# Patient Record
Sex: Male | Born: 1980
Health system: Southern US, Community
[De-identification: ages and names within clinical notes are randomized; demographics above are authoritative.]

## PROBLEM LIST (undated history)

## (undated) ENCOUNTER — Emergency Department (HOSPITAL_BASED_OUTPATIENT_CLINIC_OR_DEPARTMENT_OTHER): Discharge status: Absent without leave | Payer: Managed Care, Other (non HMO)

## (undated) DIAGNOSIS — F419 Anxiety disorder, unspecified: Secondary | ICD-10-CM

## (undated) DIAGNOSIS — N289 Disorder of kidney and ureter, unspecified: Secondary | ICD-10-CM

## (undated) DIAGNOSIS — F329 Major depressive disorder, single episode, unspecified: Secondary | ICD-10-CM

## (undated) DIAGNOSIS — G8929 Other chronic pain: Secondary | ICD-10-CM

## (undated) DIAGNOSIS — M5126 Other intervertebral disc displacement, lumbar region: Secondary | ICD-10-CM

## (undated) DIAGNOSIS — M545 Low back pain, unspecified: Secondary | ICD-10-CM

## (undated) DIAGNOSIS — Z765 Malingerer [conscious simulation]: Secondary | ICD-10-CM

## (undated) DIAGNOSIS — M1990 Unspecified osteoarthritis, unspecified site: Secondary | ICD-10-CM

## (undated) DIAGNOSIS — G4730 Sleep apnea, unspecified: Secondary | ICD-10-CM

## (undated) DIAGNOSIS — M51369 Other intervertebral disc degeneration, lumbar region without mention of lumbar back pain or lower extremity pain: Secondary | ICD-10-CM

## (undated) DIAGNOSIS — N200 Calculus of kidney: Secondary | ICD-10-CM

## (undated) DIAGNOSIS — F32A Depression, unspecified: Secondary | ICD-10-CM

## (undated) DIAGNOSIS — M5136 Other intervertebral disc degeneration, lumbar region: Secondary | ICD-10-CM

## (undated) DIAGNOSIS — F514 Sleep terrors [night terrors]: Secondary | ICD-10-CM

## (undated) DIAGNOSIS — Z8489 Family history of other specified conditions: Secondary | ICD-10-CM

## (undated) DIAGNOSIS — Z87442 Personal history of urinary calculi: Secondary | ICD-10-CM

## (undated) HISTORY — PX: BLADDER STONE REMOVAL: SHX568

## (undated) HISTORY — PX: LITHOTRIPSY: SUR834

---

## 2003-04-20 ENCOUNTER — Emergency Department (HOSPITAL_COMMUNITY): Admission: EM | Admit: 2003-04-20 | Discharge: 2003-04-20 | Payer: Self-pay | Admitting: Emergency Medicine

## 2010-03-23 ENCOUNTER — Emergency Department (HOSPITAL_BASED_OUTPATIENT_CLINIC_OR_DEPARTMENT_OTHER)
Admission: EM | Admit: 2010-03-23 | Discharge: 2010-03-23 | Payer: Self-pay | Source: Home / Self Care | Admitting: Emergency Medicine

## 2010-04-01 ENCOUNTER — Encounter: Payer: Self-pay | Admitting: Internal Medicine

## 2010-04-01 ENCOUNTER — Ambulatory Visit: Payer: Self-pay | Admitting: Internal Medicine

## 2010-04-01 DIAGNOSIS — R1032 Left lower quadrant pain: Secondary | ICD-10-CM | POA: Insufficient documentation

## 2010-04-01 LAB — CONVERTED CEMR LAB
Basophils Absolute: 0 10*3/uL (ref 0.0–0.1)
Basophils Relative: 0 % (ref 0–1)
Eosinophils Absolute: 0.1 10*3/uL (ref 0.0–0.7)
Eosinophils Relative: 1 % (ref 0–5)
HCT: 44.5 % (ref 39.0–52.0)
Hemoglobin: 16 g/dL (ref 13.0–17.0)
Lymphocytes Relative: 26 % (ref 12–46)
Lymphs Abs: 2.7 10*3/uL (ref 0.7–4.0)
MCHC: 36 g/dL (ref 30.0–36.0)
MCV: 87.3 fL (ref 78.0–100.0)
Monocytes Absolute: 0.9 10*3/uL (ref 0.1–1.0)
Monocytes Relative: 9 % (ref 3–12)
Neutro Abs: 6.4 10*3/uL (ref 1.7–7.7)
Neutrophils Relative %: 64 % (ref 43–77)
Platelets: 288 10*3/uL (ref 150–400)
RBC: 5.1 M/uL (ref 4.22–5.81)
RDW: 12.7 % (ref 11.5–15.5)
Sed Rate: 1 mm/hr (ref 0–16)
WBC: 10.1 10*3/uL (ref 4.0–10.5)

## 2010-04-02 ENCOUNTER — Telehealth: Payer: Self-pay | Admitting: Internal Medicine

## 2010-04-03 ENCOUNTER — Encounter (INDEPENDENT_AMBULATORY_CARE_PROVIDER_SITE_OTHER): Payer: Self-pay | Admitting: *Deleted

## 2010-04-15 ENCOUNTER — Ambulatory Visit
Admission: RE | Admit: 2010-04-15 | Discharge: 2010-04-15 | Payer: Self-pay | Source: Home / Self Care | Attending: Internal Medicine | Admitting: Internal Medicine

## 2010-04-15 DIAGNOSIS — Z87442 Personal history of urinary calculi: Secondary | ICD-10-CM | POA: Insufficient documentation

## 2010-04-15 LAB — CONVERTED CEMR LAB: Tissue Transglutaminase Ab, IgA: 1.9 units (ref <20)

## 2010-04-21 ENCOUNTER — Encounter: Payer: Self-pay | Admitting: Internal Medicine

## 2010-05-15 NOTE — Assessment & Plan Note (Signed)
Summary: new to est uhc/mhf   Vital Signs:  Patient profile:   30 year old male Height:      68 inches Weight:      184.25 pounds BMI:     28.12 O2 Sat:      99 % on Room air Temp:     97.0 degrees F oral Pulse rate:   88 / minute Resp:     19 per minute BP sitting:   110 / 80  (right arm) Cuff size:   large  Vitals Entered By: Glendell Docker CMA (April 01, 2010 1:34 PM)  O2 Flow:  Room air CC: New patient    Primary Care Provider:  Dondra Spry DO  CC:  New patient .  History of Present Illness: 30 y/o white male to establish seen in ER 12/11 for significant abd pain  -  LLQ.    came back from party - family function bbq, slaw, sweet tea came home - ate pinto beans started having loose stools, stomach "felt tore up" previously suffered intermittent abd pains (ongoing last 4-5 yrs). prev abd pain always got better after BMs.  no bloody diarrhea.  no fever or chills weight is stable no family hx of IBD no prev abd surgery freq eats fast food usual BMs every other day,  consistency is variable occ stools are hard to pass   ER records reviewed: CT of abd and pelvis 6 mm diameter nonobstructing calculus of lower pole of left kidney additional tiny nonobstructing kidney stone left lower kidney no analysis of kidney tiny renal cysts disparate small bowel diameter  Preventive Screening-Counseling & Management  Alcohol-Tobacco     Alcohol drinks/day: <1     Smoking Status: current     Packs/Day: 0.5     Year Started: 1999  Caffeine-Diet-Exercise     Caffeine use/day: 3-5 beverages daily     Does Patient Exercise: no  Allergies (verified): No Known Drug Allergies  Past History:  Past Surgical History: Denies surgical history  Contraindications/Deferment of Procedures/Staging:    Test/Procedure: FLU VAX    Reason for deferment: patient declined   Family History: Family History of Stroke F 1st degree relative maternal gm Family History of Stroke M  1st degree relative paternal gf  Social History: 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    Physical Exam  General:  alert, well-developed, and well-nourished.   Head:  normocephalic and atraumatic.   Eyes:  pupils equal, pupils round, and pupils reactive to light.   Mouth:  good dentition and pharynx pink and moist.   Neck:  No deformities, masses, or tenderness noted. Lungs:  normal respiratory effort and normal breath sounds.   Heart:  normal rate, regular rhythm, and no gallop.   Abdomen:  mild LLQ tenderness,  soft, normal bowel sounds, no guarding, no rigidity, no hepatomegaly, and no splenomegaly.   Extremities:  No lower extremity edema Neurologic:  cranial nerves II-XII intact and gait normal.   Skin:  keratotic skin lesion left upper forearm Psych:  normally interactive, good eye  contact, not anxious appearing, and not depressed appearing.     Impression & Recommendations:  Problem # 1:  ABDOMINAL PAIN, LEFT LOWER QUADRANT (ICD-789.04) unexplained LLQ pain.  possbile IBS .  use antispasmodic  Orders: T-CBC w/Diff ([address]-60454) T-Sed Rate (Automated) (09811-91478) Gastroenterology Referral (GI)  Complete Medication List: 1)  Oxycodone-acetaminophen 5-325 Mg Tabs (Oxycodone-acetaminophen) .Marland Kitchen.. 1-2 tablets by mouth every 6 hours as needed pai n 2)  Colace 100 Mg Caps (Docusate sodium) .... Take 1 capsule by mouth two times a day as needed 3)  Promethazine Hcl 25 Mg Tabs (Promethazine hcl) .... One tablet by mouth  every 8 hours as needed nausea 4)  Zofran Odt 8 Mg Tbdp (Ondansetron) .... One tablet by mouth every 4 hours as needed nausea & vomiting 5)  Dicyclomine Hcl 10 Mg Caps (Dicyclomine hcl) .... One by mouth three times a day as needed  Patient Instructions: 1)  Use miralax over the counter two times a day 2)  Please schedule a follow-up appointment in 2 weeks. 3)  Call our office if your symptoms do not  improve or gets worse. Prescriptions: DICYCLOMINE HCL 10 MG CAPS (DICYCLOMINE HCL) one by mouth three times a day as needed  #30 x 0   Entered and Authorized by:   D. Thomos Lemons DO   Signed by:   D. Thomos Lemons DO on 04/01/2010   Method used:   Print then Give to Patient   RxID:   [id number]    Orders Added: 1)  T-CBC w/Diff [[id number]] 2)  T-Sed Rate (Automated) [44010-27253] 3)  Gastroenterology Referral [GI] 4)  New Patient Level III [99203]   Immunization History:  Influenza Immunization History:    Influenza:  declined (04/01/2010)   Immunization History:  Influenza Immunization History:    Influenza:  Declined (04/01/2010)  Current Allergies (reviewed today): No known allergies

## 2010-05-15 NOTE — Assessment & Plan Note (Signed)
Summary: 2 week follow up/mhf   Vital Signs:  Patient profile:   30 year old male Height:      68 inches Weight:      187.50 pounds BMI:     28.61 O2 Sat:      99 % on Room air Temp:     98.2 degrees F oral Pulse rate:   93 / minute Resp:     18 per minute BP sitting:   100 / 78  (left arm) Cuff size:   large  Vitals Entered By: Glendell Docker CMA (April 15, 2010 3:51 PM)  O2 Flow:  Room air CC: 2 week follow up  Is Patient Diabetic? No Pain Assessment Patient in pain? no      Comments symptoms have resolved, feels better, no concerns   Primary Care Provider:  DThomos Lemons DO  CC:  2 week follow up .  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  Physical Exam  General:  alert, well-developed, and well-nourished.   Lungs:  normal respiratory effort and normal breath sounds.   Heart:  normal rate, regular rhythm, and no gallop.   Abdomen:  soft, non-tender, normal bowel sounds, and no masses.     Impression & Recommendations:  Problem # 1:  ABDOMINAL PAIN, LEFT LOWER QUADRANT (ICD-789.04) Assessment Improved improved with regular fiber intake and bentyl cancel GI referral. probable IBS  rule out celiac sprue  Orders: T- * Misc. Laboratory test [phone])  Problem # 2:  NEPHROLITHIASIS, HX OF (ICD-V13.01) CT of abd and pelvis  03/2010 6 mm diameter nonobstructing calculus of lower pole of  left kidney additional tiny nonobstructing kidney stone left lower kidney no analysis of kidney tiny renal cysts disparate small bowel diameter  pt advised to increase fluid intake.  avoid dehydration  Patient Instructions: 1)  Please schedule a follow-up appointment in 1 year.   Orders Added: 1)  T- * Misc. Laboratory test [99999] 2)  Est. Patient Level III [60454]    Current Allergies (reviewed today): No known allergies

## 2010-05-15 NOTE — Progress Notes (Signed)
Summary: Lab Results  Phone Note Outgoing Call   Summary of Call: call pt - cbc and sed rate normal Initial call taken by: D. Thomos Lemons DO,  April 02, 2010 12:37 PM  Follow-up for Phone Call        call placed to patient at [phone], no answer, A detailed voice message was left informing patient per Dr Artist Pais instructions Follow-up by: Glendell Docker CMA,  April 02, 2010 2:07 PM

## 2010-05-15 NOTE — Letter (Signed)
Summary: Primary Care Consult Scheduled Letter  Gregory Flores at [facility]  [address]   [place], [place] [address]   Phone: [phone]  Fax: [phone]      04/03/2010 MRN: [id number]  [facility] [address] [place], [place]  [address]    Dear Gregory Flores,      We have scheduled an appointment for you.  At the recommendation of Dr.YOO, we have scheduled you a consult with [facility],   DR Vernell Barrier on JANUARY 5,2012 at 10:45AM.  Their address is_[address], [place] . The office phone number is [phone].  If this appointment day and time is not convenient for you, please feel free to call the office of the doctor you are being referred to at the number listed above and reschedule the appointment.     It is important for you to keep your scheduled appointments. We are here to make sure you are given good patient care.     Thank you,  Darral Dash Patient Care Coordinator [facility]

## 2010-05-15 NOTE — Letter (Signed)
   [place] at [facility] [address] [place], [place]  [address]  [place] Phone: [phone]      April 21, 2010   [facility] [address] [place], [place] [address]  RE:  LAB RESULTS  Dear  Mr. Szafran,  The following is an interpretation of your most recent lab tests.  Please take note of any instructions provided or changes to medications that have resulted from your lab work.   Celiac sprue antibody test - negative        Sincerely Yours,    Dr. Thomos Lemons  Appended Document:  mailed

## 2010-06-24 LAB — URINALYSIS, ROUTINE W REFLEX MICROSCOPIC
Bilirubin Urine: NEGATIVE
Glucose, UA: NEGATIVE mg/dL
Ketones, ur: NEGATIVE mg/dL
Leukocytes, UA: NEGATIVE
Nitrite: NEGATIVE
Protein, ur: NEGATIVE mg/dL
Specific Gravity, Urine: 1.023 (ref 1.005–1.030)
Urobilinogen, UA: 1 mg/dL (ref 0.0–1.0)
pH: 6 (ref 5.0–8.0)

## 2010-06-24 LAB — COMPREHENSIVE METABOLIC PANEL
ALT: 29 U/L (ref 0–53)
AST: 22 U/L (ref 0–37)
Albumin: 4.4 g/dL (ref 3.5–5.2)
Alkaline Phosphatase: 76 U/L (ref 39–117)
BUN: 18 mg/dL (ref 6–23)
CO2: 24 mEq/L (ref 19–32)
Calcium: 9.6 mg/dL (ref 8.4–10.5)
Chloride: 107 mEq/L (ref 96–112)
Creatinine, Ser: 1.2 mg/dL (ref 0.4–1.5)
GFR calc Af Amer: >60 mL/min (ref >60)
GFR calc non Af Amer: >60 mL/min (ref >60)
Glucose, Bld: 110 mg/dL — ABNORMAL HIGH (ref 70–99)
Potassium: 4.4 mEq/L (ref 3.5–5.1)
Sodium: 143 mEq/L (ref 135–145)
Total Bilirubin: 0.6 mg/dL (ref 0.3–1.2)
Total Protein: 7.2 g/dL (ref 6.0–8.3)

## 2010-06-24 LAB — CBC
HCT: 42.3 % (ref 39.0–52.0)
Hemoglobin: 15.3 g/dL (ref 13.0–17.0)
MCH: 31.1 pg (ref 26.0–34.0)
MCHC: 36.2 g/dL — ABNORMAL HIGH (ref 30.0–36.0)
MCV: 86 fL (ref 78.0–100.0)
Platelets: 273 10*3/uL (ref 150–400)
RBC: 4.92 MIL/uL (ref 4.22–5.81)
RDW: 12.6 % (ref 11.5–15.5)
WBC: 12.7 10*3/uL — ABNORMAL HIGH (ref 4.0–10.5)

## 2010-06-24 LAB — URINE MICROSCOPIC-ADD ON

## 2010-06-24 LAB — DIFFERENTIAL
Basophils Absolute: 0 10*3/uL (ref 0.0–0.1)
Basophils Relative: 0 % (ref 0–1)
Eosinophils Absolute: 0.1 10*3/uL (ref 0.0–0.7)
Eosinophils Relative: 1 % (ref 0–5)
Lymphocytes Relative: 13 % (ref 12–46)
Lymphs Abs: 1.7 10*3/uL (ref 0.7–4.0)
Monocytes Absolute: 1.1 10*3/uL — ABNORMAL HIGH (ref 0.1–1.0)
Monocytes Relative: 9 % (ref 3–12)
Neutro Abs: 9.9 10*3/uL — ABNORMAL HIGH (ref 1.7–7.7)
Neutrophils Relative %: 78 % — ABNORMAL HIGH (ref 43–77)

## 2010-09-01 ENCOUNTER — Emergency Department (INDEPENDENT_AMBULATORY_CARE_PROVIDER_SITE_OTHER): Payer: 59

## 2010-09-01 ENCOUNTER — Emergency Department (HOSPITAL_BASED_OUTPATIENT_CLINIC_OR_DEPARTMENT_OTHER)
Admission: EM | Admit: 2010-09-01 | Discharge: 2010-09-01 | Disposition: A | Discharge status: Home / Self Care | Payer: 59 | Attending: Emergency Medicine | Admitting: Emergency Medicine

## 2010-09-01 DIAGNOSIS — N201 Calculus of ureter: Secondary | ICD-10-CM | POA: Insufficient documentation

## 2010-09-01 DIAGNOSIS — N200 Calculus of kidney: Secondary | ICD-10-CM

## 2010-09-01 DIAGNOSIS — R109 Unspecified abdominal pain: Secondary | ICD-10-CM | POA: Insufficient documentation

## 2010-09-01 LAB — URINALYSIS, ROUTINE W REFLEX MICROSCOPIC
Bilirubin Urine: NEGATIVE
Glucose, UA: NEGATIVE mg/dL
Ketones, ur: 15 mg/dL — AB
Nitrite: NEGATIVE
Protein, ur: 30 mg/dL — AB
Specific Gravity, Urine: 1.016 (ref 1.005–1.030)
Urobilinogen, UA: 0.2 mg/dL (ref 0.0–1.0)
pH: 6 (ref 5.0–8.0)

## 2010-09-01 LAB — DIFFERENTIAL
Basophils Absolute: 0 10*3/uL (ref 0.0–0.1)
Basophils Relative: 0 % (ref 0–1)
Eosinophils Absolute: 0.1 10*3/uL (ref 0.0–0.7)
Eosinophils Relative: 1 % (ref 0–5)
Monocytes Absolute: 0.6 10*3/uL (ref 0.1–1.0)
Neutro Abs: 4.8 10*3/uL (ref 1.7–7.7)

## 2010-09-01 LAB — CBC
HCT: 43.4 % (ref 39.0–52.0)
Hemoglobin: 15.6 g/dL (ref 13.0–17.0)
MCH: 30.4 pg (ref 26.0–34.0)
MCHC: 35.9 g/dL (ref 30.0–36.0)
RDW: 12.4 % (ref 11.5–15.5)

## 2010-09-01 LAB — BASIC METABOLIC PANEL
CO2: 24 mEq/L (ref 19–32)
Calcium: 9.5 mg/dL (ref 8.4–10.5)
Chloride: 102 mEq/L (ref 96–112)
GFR calc Af Amer: >60 mL/min (ref >60)
GFR calc non Af Amer: >60 mL/min (ref >60)
Glucose, Bld: 100 mg/dL — ABNORMAL HIGH (ref 70–99)
Sodium: 139 mEq/L (ref 135–145)

## 2010-09-01 LAB — URINE MICROSCOPIC-ADD ON

## 2011-03-19 ENCOUNTER — Encounter: Payer: Self-pay | Admitting: Internal Medicine

## 2011-04-15 ENCOUNTER — Encounter: Payer: Self-pay | Admitting: Internal Medicine

## 2011-08-28 ENCOUNTER — Emergency Department (HOSPITAL_BASED_OUTPATIENT_CLINIC_OR_DEPARTMENT_OTHER)
Admission: EM | Admit: 2011-08-28 | Discharge: 2011-08-28 | Disposition: A | Discharge status: Home / Self Care | Payer: BC Managed Care – PPO | Attending: Emergency Medicine | Admitting: Emergency Medicine

## 2011-08-28 ENCOUNTER — Encounter (HOSPITAL_BASED_OUTPATIENT_CLINIC_OR_DEPARTMENT_OTHER): Payer: Self-pay | Admitting: *Deleted

## 2011-08-28 DIAGNOSIS — F17200 Nicotine dependence, unspecified, uncomplicated: Secondary | ICD-10-CM | POA: Insufficient documentation

## 2011-08-28 DIAGNOSIS — R110 Nausea: Secondary | ICD-10-CM | POA: Insufficient documentation

## 2011-08-28 DIAGNOSIS — K5900 Constipation, unspecified: Secondary | ICD-10-CM | POA: Insufficient documentation

## 2011-08-28 HISTORY — DX: Disorder of kidney and ureter, unspecified: N28.9

## 2011-08-28 MED ORDER — BISACODYL 5 MG PO TBEC: 5.0000 mg | DELAYED_RELEASE_TABLET | Freq: Two times a day (BID) | ORAL | Status: AC

## 2011-08-28 MED ORDER — FLEET ENEMA 7-19 GM/118ML RE ENEM
ENEMA | RECTAL | Status: AC
  Filled 2011-08-28: qty 1

## 2011-08-28 MED ORDER — FLEET ENEMA 7-19 GM/118ML RE ENEM
1.0000 | ENEMA | Freq: Once | RECTAL | Status: AC
  Administered 2011-08-28: 1 via RECTAL

## 2011-08-28 NOTE — ED Notes (Signed)
Pt had recent lithotripsy performed over a week ago, and is now on pain meds. States d/t the narcotics, he has been unable to have a bowel movement since Monday of this week. Pt gave himself an enema approx 1 hour ago without relief.

## 2011-08-28 NOTE — Discharge Instructions (Signed)

## 2011-08-28 NOTE — ED Provider Notes (Signed)
History     CSN: [id number]  Arrival date & time 08/28/11  2053   First MD Initiated Contact with Patient 08/28/11 2143      Chief Complaint  Patient presents with  . Constipation    (Consider location/radiation/quality/duration/timing/severity/associated sxs/prior treatment) HPI Patient with complaints of constipation since Monday. He has been oxycodone for pain since lithotripsy.  He feels rectal fullness and has had some nausea but no vomiting or abdominal pain. Patient given fleets enema prior to my evaluation and now has resolution of his symptoms.  Past Surgical History  Procedure Date  . Lithotripsy     No family history on file.  History  Substance Use Topics  . Smoking status: Current Everyday Smoker  . Smokeless tobacco: Not on file  . Alcohol Use: Yes     rarely      Review of Systems  Gastrointestinal: Negative.   Genitourinary: Negative.     Allergies  Review of patient's allergies indicates no known allergies.  Home Medications   Current Outpatient Rx  Name Route Sig Dispense Refill  . IBUPROFEN 200 MG PO TABS Oral Take 200 mg by mouth every 6 (six) hours as needed. For pain    . OXYCODONE HCL 30 MG PO TABS Oral Take 30 mg by mouth 2 (two) times daily.      BP 131/81  Pulse 88  Temp(Src) 98.7 F (37.1 C) (Oral)  Resp 18  Ht 5\' 7"  (1.702 m)  Wt 190 lb (86.183 kg)  BMI 29.76 kg/m2  SpO2 98%  Physical Exam  Nursing note and vitals reviewed. Constitutional: He is oriented to person, place, and time. He appears well-developed and well-nourished.  HENT:  Head: Normocephalic and atraumatic.  Cardiovascular: Normal rate and regular rhythm.   Pulmonary/Chest: Effort normal and breath sounds normal.  Abdominal: Soft. Bowel sounds are normal.  Neurological: He is alert and oriented to person, place, and time.  Skin: Skin is warm and dry.  Psychiatric: He has a normal mood and affect.    ED Course  Procedures (including critical care  time)  Labs Reviewed - No data to display No results found.   No diagnosis found.    MDM         Hilario Quarry, MD 08/28/11 2322

## 2012-11-16 ENCOUNTER — Emergency Department (HOSPITAL_BASED_OUTPATIENT_CLINIC_OR_DEPARTMENT_OTHER): Payer: BC Managed Care – PPO

## 2012-11-16 ENCOUNTER — Encounter (HOSPITAL_BASED_OUTPATIENT_CLINIC_OR_DEPARTMENT_OTHER): Payer: Self-pay | Admitting: *Deleted

## 2012-11-16 ENCOUNTER — Emergency Department (HOSPITAL_BASED_OUTPATIENT_CLINIC_OR_DEPARTMENT_OTHER)
Admission: EM | Admit: 2012-11-16 | Discharge: 2012-11-16 | Disposition: A | Discharge status: Home / Self Care | Payer: BC Managed Care – PPO | Attending: Emergency Medicine | Admitting: Emergency Medicine

## 2012-11-16 DIAGNOSIS — N200 Calculus of kidney: Secondary | ICD-10-CM | POA: Insufficient documentation

## 2012-11-16 DIAGNOSIS — M549 Dorsalgia, unspecified: Secondary | ICD-10-CM

## 2012-11-16 DIAGNOSIS — F17200 Nicotine dependence, unspecified, uncomplicated: Secondary | ICD-10-CM | POA: Insufficient documentation

## 2012-11-16 DIAGNOSIS — R197 Diarrhea, unspecified: Secondary | ICD-10-CM | POA: Insufficient documentation

## 2012-11-16 DIAGNOSIS — R109 Unspecified abdominal pain: Secondary | ICD-10-CM | POA: Insufficient documentation

## 2012-11-16 DIAGNOSIS — Z87448 Personal history of other diseases of urinary system: Secondary | ICD-10-CM | POA: Insufficient documentation

## 2012-11-16 DIAGNOSIS — R630 Anorexia: Secondary | ICD-10-CM | POA: Insufficient documentation

## 2012-11-16 DIAGNOSIS — Z79899 Other long term (current) drug therapy: Secondary | ICD-10-CM | POA: Insufficient documentation

## 2012-11-16 DIAGNOSIS — Z9889 Other specified postprocedural states: Secondary | ICD-10-CM | POA: Insufficient documentation

## 2012-11-16 DIAGNOSIS — R112 Nausea with vomiting, unspecified: Secondary | ICD-10-CM | POA: Insufficient documentation

## 2012-11-16 LAB — BASIC METABOLIC PANEL
BUN: 11 mg/dL (ref 6–23)
Creatinine, Ser: 0.9 mg/dL (ref 0.50–1.35)
GFR calc non Af Amer: >90 mL/min (ref >90)
Glucose, Bld: 90 mg/dL (ref 70–99)
Potassium: 3.7 mEq/L (ref 3.5–5.1)

## 2012-11-16 LAB — URINALYSIS, ROUTINE W REFLEX MICROSCOPIC
Bilirubin Urine: NEGATIVE
Glucose, UA: NEGATIVE mg/dL
Hgb urine dipstick: NEGATIVE
Ketones, ur: NEGATIVE mg/dL
Leukocytes, UA: NEGATIVE
Protein, ur: NEGATIVE mg/dL
Specific Gravity, Urine: 1.019 (ref 1.005–1.030)
pH: 7.5 (ref 5.0–8.0)

## 2012-11-16 LAB — CBC
MCHC: 35.4 g/dL (ref 30.0–36.0)
RBC: 4.87 MIL/uL (ref 4.22–5.81)
RDW: 12.3 % (ref 11.5–15.5)
WBC: 11.6 10*3/uL — ABNORMAL HIGH (ref 4.0–10.5)

## 2012-11-16 LAB — URINE MICROSCOPIC-ADD ON

## 2012-11-16 MED ORDER — HYDROMORPHONE HCL PF 1 MG/ML IJ SOLN
1.0000 mg | Freq: Once | INTRAMUSCULAR | Status: AC
  Administered 2012-11-16: 1 mg via INTRAVENOUS
  Filled 2012-11-16: qty 1

## 2012-11-16 MED ORDER — ONDANSETRON HCL 4 MG/2ML IJ SOLN
4.0000 mg | Freq: Once | INTRAMUSCULAR | Status: DC
  Filled 2012-11-16: qty 2

## 2012-11-16 MED ORDER — TAMSULOSIN HCL 0.4 MG PO CAPS: 0.4000 mg | ORAL_CAPSULE | Freq: Every day | ORAL | Status: DC

## 2012-11-16 MED ORDER — SODIUM CHLORIDE 0.9 % IV BOLUS (SEPSIS)
1000.0000 mL | Freq: Once | INTRAVENOUS | Status: AC
  Administered 2012-11-16: 1000 mL via INTRAVENOUS

## 2012-11-16 MED ORDER — HYDROCODONE-IBUPROFEN 7.5-200 MG PO TABS: 1.0000 | ORAL_TABLET | Freq: Three times a day (TID) | ORAL | Status: DC | PRN

## 2012-11-16 NOTE — ED Provider Notes (Signed)
CSN: [id number]     Arrival date & time 11/16/12  1405 History     First MD Initiated Contact with Patient 11/16/12 1501     Chief Complaint  Patient presents with  . Back Pain   (Consider location/radiation/quality/duration/timing/severity/associated sxs/prior Treatment) Patient is a 32 y.o. male presenting with abdominal pain. The history is provided by the patient.  Abdominal Pain Pain location:  L flank and R flank Pain quality: sharp   Pain radiates to:  LLQ Onset quality:  Gradual Timing:  Constant Progression:  Waxing and waning Chronicity:  Recurrent Context: awakening from sleep   Context: not eating, not sick contacts, not suspicious food intake and not trauma   Relieved by:  Nothing Worsened by:  Nothing tried Associated symptoms: anorexia, diarrhea, hematuria (last night, none today), nausea and vomiting   Associated symptoms: no fever     Past Medical History  Diagnosis Date  . Renal disorder    Past Surgical History  Procedure Laterality Date  . Lithotripsy     No family history on file. History  Substance Use Topics  . Smoking status: Current Every Day Smoker  . Smokeless tobacco: Not on file  . Alcohol Use: Yes     Comment: rarely    Review of Systems  Constitutional: Negative for fever.  Gastrointestinal: Positive for nausea, vomiting, abdominal pain, diarrhea and anorexia.  Genitourinary: Positive for hematuria (last night, none today).  All other systems reviewed and are negative.    Allergies  Review of patient's allergies indicates no known allergies.  Home Medications   Current Outpatient Rx  Name  Route  Sig  Dispense  Refill  . gabapentin (NEURONTIN) 100 MG capsule   Oral   Take 100 mg by mouth 3 (three) times daily.         . methocarbamol (ROBAXIN) 500 MG tablet   Oral   Take 500 mg by mouth 4 (four) times daily.         Marland Kitchen ibuprofen (ADVIL,MOTRIN) 200 MG tablet   Oral   Take 200 mg by mouth every 6 (six) hours as  needed. For pain         . oxycodone (ROXICODONE) 30 MG immediate release tablet   Oral   Take 30 mg by mouth 2 (two) times daily.          BP 133/82  Pulse 92  Temp(Src) 97.9 F (36.6 C) (Oral)  Resp 18  Ht 5\' 7"  (1.702 m)  Wt 160 lb (72.576 kg)  BMI 25.05 kg/m2  SpO2 98% Physical Exam  Nursing note and vitals reviewed. Constitutional: He is oriented to person, place, and time. He appears well-developed and well-nourished. No distress.  HENT:  Head: Normocephalic and atraumatic.  Mouth/Throat: No oropharyngeal exudate.  Eyes: EOM are normal. Pupils are equal, round, and reactive to light.  Neck: Normal range of motion. Neck supple.  Cardiovascular: Normal rate and regular rhythm.  Exam reveals no friction rub.   No murmur heard. Pulmonary/Chest: Effort normal and breath sounds normal. No respiratory distress. He has no wheezes. He has no rales.  Abdominal: He exhibits no distension. There is tenderness (L flank). There is no rebound.  Musculoskeletal: Normal range of motion. He exhibits no edema.  Neurological: He is alert and oriented to person, place, and time.  Skin: He is not diaphoretic.    ED Course   Procedures (including critical care time)  Labs Reviewed  URINALYSIS, ROUTINE W REFLEX MICROSCOPIC - Abnormal; Notable for  the following:    APPearance TURBID (*)    All other components within normal limits  URINE MICROSCOPIC-ADD ON - Abnormal; Notable for the following:    Bacteria, UA FEW (*)    All other components within normal limits  URINALYSIS, ROUTINE W REFLEX MICROSCOPIC  CBC  BASIC METABOLIC PANEL   Ct Abdomen Pelvis Wo Contrast  11/16/2012   *RADIOLOGY REPORT*  Clinical Data: Low back pain, history of kidney stones  CT ABDOMEN AND PELVIS WITHOUT CONTRAST  Technique:  Multidetector CT imaging of the abdomen and pelvis was performed following the standard protocol without intravenous contrast.  Comparison: None.  Findings: Lung bases are clear.   Unenhanced liver, spleen, pancreas, and adrenal glands are within normal limits.  Gallbladder is unremarkable.  No intrahepatic or extrahepatic ductal dilatation.  Two 4 mm nonobstructing calculi in the left lower kidney (series 4/image 46).  Additional punctate nonobstructing left renal calculi.  Two 2 mm nonobstructing calculi in the right lower pole (series 4/image 50) and interpolar region (series 4/image 57).  No hydronephrosis.  No evidence of bowel obstruction.  Normal appendix.  No evidence of abdominal aortic aneurysm.  No bone pelvic ascites.  No suspicious abdominopelvic lymphadenopathy.  Prostate is unremarkable.  No ureteral or bladder calculi.  Bladder is unremarkable.  Visualized osseous structures are within normal limits.  IMPRESSION: Bilateral nonobstructing renal calculi measuring up to 4 mm in the left lower kidney.  No ureteral or bladder calculi.  No hydronephrosis.   Original Report Authenticated By: Charline Bills, M.D.   1. Back pain   2. Kidney stone     MDM    is a 32 year old male with history of kidney stones presents with back pain. States this feels similar to a previous kidney stone. Left and right flank, left worse than right. Sharp pain with some mild waxing and waning. Some mild hematuria last night. Associated nausea vomiting. No fever. Stated some mild R testicle pain earlier, however none now. Here vitals are stable. Has some left flank tenderness. GU exam normal. No CVA tenderness. He is visibly uncomfortable on exam. Will give him fluids, pain meds. Will CT for possible stone.  CT negative for obstructing stone. Stones are present in the left kidney, however no hydronephroureter stones. Patient stable for discharge. Given Flomax and pain medicine. No other etiology present on CT to explain his pain.   Dagmar Hait, MD 11/16/12 [phone]

## 2012-11-16 NOTE — ED Notes (Addendum)
Patient states that he began having lower back pain this morning. Patient recently placed on gabapentin for back pain, states "this is different"

## 2012-12-11 ENCOUNTER — Encounter (HOSPITAL_BASED_OUTPATIENT_CLINIC_OR_DEPARTMENT_OTHER): Payer: Self-pay

## 2012-12-11 ENCOUNTER — Emergency Department (HOSPITAL_BASED_OUTPATIENT_CLINIC_OR_DEPARTMENT_OTHER): Payer: BC Managed Care – PPO

## 2012-12-11 ENCOUNTER — Emergency Department (HOSPITAL_BASED_OUTPATIENT_CLINIC_OR_DEPARTMENT_OTHER)
Admission: EM | Admit: 2012-12-11 | Discharge: 2012-12-11 | Disposition: A | Discharge status: Home / Self Care | Payer: BC Managed Care – PPO | Attending: Emergency Medicine | Admitting: Emergency Medicine

## 2012-12-11 DIAGNOSIS — N390 Urinary tract infection, site not specified: Secondary | ICD-10-CM | POA: Insufficient documentation

## 2012-12-11 DIAGNOSIS — F17200 Nicotine dependence, unspecified, uncomplicated: Secondary | ICD-10-CM | POA: Insufficient documentation

## 2012-12-11 DIAGNOSIS — Z87448 Personal history of other diseases of urinary system: Secondary | ICD-10-CM | POA: Insufficient documentation

## 2012-12-11 DIAGNOSIS — Z79899 Other long term (current) drug therapy: Secondary | ICD-10-CM | POA: Insufficient documentation

## 2012-12-11 DIAGNOSIS — Z87442 Personal history of urinary calculi: Secondary | ICD-10-CM | POA: Insufficient documentation

## 2012-12-11 DIAGNOSIS — N200 Calculus of kidney: Secondary | ICD-10-CM | POA: Insufficient documentation

## 2012-12-11 DIAGNOSIS — R630 Anorexia: Secondary | ICD-10-CM | POA: Insufficient documentation

## 2012-12-11 DIAGNOSIS — R319 Hematuria, unspecified: Secondary | ICD-10-CM | POA: Insufficient documentation

## 2012-12-11 DIAGNOSIS — R1110 Vomiting, unspecified: Secondary | ICD-10-CM | POA: Insufficient documentation

## 2012-12-11 DIAGNOSIS — Q619 Cystic kidney disease, unspecified: Secondary | ICD-10-CM | POA: Insufficient documentation

## 2012-12-11 DIAGNOSIS — N281 Cyst of kidney, acquired: Secondary | ICD-10-CM

## 2012-12-11 HISTORY — DX: Calculus of kidney: N20.0

## 2012-12-11 LAB — URINALYSIS, ROUTINE W REFLEX MICROSCOPIC
Glucose, UA: NEGATIVE mg/dL
Protein, ur: >300 mg/dL — AB
Specific Gravity, Urine: 1.03 (ref 1.005–1.030)
Urobilinogen, UA: 1 mg/dL (ref 0.0–1.0)

## 2012-12-11 LAB — CBC WITH DIFFERENTIAL/PLATELET
Basophils Absolute: 0 10*3/uL (ref 0.0–0.1)
Lymphocytes Relative: 28 % (ref 12–46)
Neutro Abs: 5.9 10*3/uL (ref 1.7–7.7)
Platelets: 309 10*3/uL (ref 150–400)
RDW: 12.4 % (ref 11.5–15.5)
WBC: 10 10*3/uL (ref 4.0–10.5)

## 2012-12-11 LAB — BASIC METABOLIC PANEL
CO2: 28 mEq/L (ref 19–32)
Calcium: 9.6 mg/dL (ref 8.4–10.5)
Chloride: 103 mEq/L (ref 96–112)
Potassium: 3.7 mEq/L (ref 3.5–5.1)
Sodium: 141 mEq/L (ref 135–145)

## 2012-12-11 LAB — URINE MICROSCOPIC-ADD ON

## 2012-12-11 MED ORDER — HYDROMORPHONE HCL PF 1 MG/ML IJ SOLN
1.0000 mg | Freq: Once | INTRAMUSCULAR | Status: AC
  Administered 2012-12-11: 1 mg via INTRAVENOUS
  Filled 2012-12-11: qty 1

## 2012-12-11 MED ORDER — CEFDINIR 300 MG PO CAPS: 300.0000 mg | ORAL_CAPSULE | Freq: Two times a day (BID) | ORAL | Status: DC

## 2012-12-11 MED ORDER — HYDROMORPHONE HCL 2 MG PO TABS: 2.0000 mg | ORAL_TABLET | ORAL | Status: DC | PRN

## 2012-12-11 MED ORDER — DEXTROSE 5 % IV SOLN
1.0000 g | INTRAVENOUS | Status: DC
  Administered 2012-12-11: 1 g via INTRAVENOUS
  Filled 2012-12-11: qty 10

## 2012-12-11 MED ORDER — ONDANSETRON HCL 4 MG PO TABS: 4.0000 mg | ORAL_TABLET | Freq: Four times a day (QID) | ORAL | Status: DC

## 2012-12-11 MED ORDER — ONDANSETRON HCL 4 MG/2ML IJ SOLN
4.0000 mg | Freq: Once | INTRAMUSCULAR | Status: AC
  Administered 2012-12-11: 4 mg via INTRAVENOUS
  Filled 2012-12-11: qty 2

## 2012-12-11 NOTE — ED Provider Notes (Signed)
CSN: [id number]     Arrival date & time 12/11/12  1955 History   First MD Initiated Contact with Patient 12/11/12 2018     Chief Complaint  Patient presents with  . Groin Pain   (Consider location/radiation/quality/duration/timing/severity/associated sxs/prior Treatment) Patient is a 32 y.o. male presenting with abdominal pain. The history is provided by the patient. No language interpreter was used.  Abdominal Pain Pain location:  LLQ Pain quality: aching and sharp   Pain radiates to:  LLQ and L flank Pain severity:  Severe Onset quality:  Gradual Duration:  6 days Timing:  Constant Progression:  Worsening Chronicity:  New Relieved by:  Nothing Worsened by:  Coughing Associated symptoms: anorexia, hematuria and vomiting   Pt had a stent placed on the 25th by Dr. Cleatrice Burke for kidney stones in left kidney and a fragment of a stone in ureter.  Pt had lithrotripsy on the 18th.  Pt complains of increasing pain,  Bleeding had stopped but has returned.  Pt reports constipated strained to have BM on Thursday and now has increased pain in left groin.   Discomfort goes to left testicle.    Past Medical History  Diagnosis Date  . Renal disorder   . Kidney stone    Past Surgical History  Procedure Laterality Date  . Lithotripsy     No family history on file. History  Substance Use Topics  . Smoking status: Current Every Day Smoker  . Smokeless tobacco: Not on file  . Alcohol Use: Yes     Comment: rarely    Review of Systems  Gastrointestinal: Positive for vomiting, abdominal pain and anorexia.  Genitourinary: Positive for hematuria.  All other systems reviewed and are negative.    Allergies  Review of patient's allergies indicates no known allergies.  Home Medications   Current Outpatient Rx  Name  Route  Sig  Dispense  Refill  . gabapentin (NEURONTIN) 100 MG capsule   Oral   Take 100 mg by mouth 3 (three) times daily.         Marland Kitchen HYDROcodone-ibuprofen (VICOPROFEN)  7.5-200 MG per tablet   Oral   Take 1 tablet by mouth every 8 (eight) hours as needed for pain.   30 tablet   0   . ibuprofen (ADVIL,MOTRIN) 200 MG tablet   Oral   Take 200 mg by mouth every 6 (six) hours as needed. For pain         . methocarbamol (ROBAXIN) 500 MG tablet   Oral   Take 500 mg by mouth 4 (four) times daily.         Marland Kitchen oxycodone (ROXICODONE) 30 MG immediate release tablet   Oral   Take 30 mg by mouth 2 (two) times daily.         . tamsulosin (FLOMAX) 0.4 MG CAPS capsule   Oral   Take 1 capsule (0.4 mg total) by mouth daily.   30 capsule   0    BP 135/73  Pulse 87  Temp(Src) 98.6 F (37 C) (Oral)  Resp 20  SpO2 98% Physical Exam  Nursing note and vitals reviewed. Constitutional: He is oriented to person, place, and time. He appears well-developed and well-nourished.  HENT:  Head: Normocephalic.  Eyes: Pupils are equal, round, and reactive to light.  Neck: Normal range of motion. Neck supple.  Cardiovascular: Normal rate and normal heart sounds.   Pulmonary/Chest: Effort normal and breath sounds normal.  Abdominal: Soft. Bowel sounds are normal.  Genitourinary: Penis  normal.  No hernia,  Slight increase scrotal size left compared to right,  Nontender,    Musculoskeletal: Normal range of motion.  Neurological: He is alert and oriented to person, place, and time. He has normal reflexes.  Skin: Skin is warm.  Psychiatric: He has a normal mood and affect.    ED Course  Procedures (including critical care time) Labs Review Labs Reviewed  URINALYSIS, ROUTINE W REFLEX MICROSCOPIC - Abnormal; Notable for the following:    Color, Urine ORANGE (*)    APPearance CLOUDY (*)    Hgb urine dipstick LARGE (*)    Bilirubin Urine SMALL (*)    Ketones, ur 15 (*)    Protein, ur >300 (*)    Nitrite POSITIVE (*)    Leukocytes, UA MODERATE (*)    All other components within normal limits  URINE MICROSCOPIC-ADD ON - Abnormal; Notable for the following:     Bacteria, UA MANY (*)    All other components within normal limits  URINE CULTURE   Imaging Review No results found.  MDM   1. UTI (lower urinary tract infection)   2. Kidney stone   3. Renal cyst   Pt given Rocephin Iv,  Pain medication,   No evidence of hernia,  Urine shows rbc's and wbc's tntc.   Ct shows renal cyst that radiologist reports could be infection.    I spoke to Dr. Lindley Magnus who advised cefdiner and pain control.   Pt advised to call office on Tuesday to be seen for evaluation   Elson Areas, PA-C 12/12/12 9604

## 2012-12-11 NOTE — ED Notes (Signed)
Patient here with left groin pain, had lithotripsy and last Tuesday stent placed for fragment that was stuck. No having nausea and increased pain, reports bloody drainage from stent

## 2012-12-11 NOTE — ED Notes (Signed)
Received report from [employer]

## 2012-12-12 LAB — URINE CULTURE: Culture: NO GROWTH

## 2012-12-12 NOTE — ED Provider Notes (Signed)
Medical screening examination/treatment/procedure(s) were performed by non-physician practitioner and as supervising physician I was immediately available for consultation/collaboration.  Layla Maw Johnny Latu, DO 12/12/12 [phone]

## 2013-01-01 ENCOUNTER — Encounter (HOSPITAL_BASED_OUTPATIENT_CLINIC_OR_DEPARTMENT_OTHER): Payer: Self-pay | Admitting: *Deleted

## 2013-01-01 ENCOUNTER — Emergency Department (HOSPITAL_BASED_OUTPATIENT_CLINIC_OR_DEPARTMENT_OTHER)
Admission: EM | Admit: 2013-01-01 | Discharge: 2013-01-02 | Disposition: A | Discharge status: Home / Self Care | Payer: BC Managed Care – PPO | Attending: Emergency Medicine | Admitting: Emergency Medicine

## 2013-01-01 ENCOUNTER — Emergency Department (HOSPITAL_BASED_OUTPATIENT_CLINIC_OR_DEPARTMENT_OTHER): Payer: BC Managed Care – PPO

## 2013-01-01 DIAGNOSIS — R569 Unspecified convulsions: Secondary | ICD-10-CM | POA: Insufficient documentation

## 2013-01-01 DIAGNOSIS — G43909 Migraine, unspecified, not intractable, without status migrainosus: Secondary | ICD-10-CM | POA: Insufficient documentation

## 2013-01-01 DIAGNOSIS — Z9889 Other specified postprocedural states: Secondary | ICD-10-CM | POA: Insufficient documentation

## 2013-01-01 DIAGNOSIS — Z87442 Personal history of urinary calculi: Secondary | ICD-10-CM | POA: Insufficient documentation

## 2013-01-01 DIAGNOSIS — F17200 Nicotine dependence, unspecified, uncomplicated: Secondary | ICD-10-CM | POA: Insufficient documentation

## 2013-01-01 DIAGNOSIS — Z79899 Other long term (current) drug therapy: Secondary | ICD-10-CM | POA: Insufficient documentation

## 2013-01-01 MED ORDER — METOCLOPRAMIDE HCL 5 MG/ML IJ SOLN
10.0000 mg | Freq: Once | INTRAMUSCULAR | Status: AC
  Administered 2013-01-01: 10 mg via INTRAVENOUS
  Filled 2013-01-01: qty 2

## 2013-01-01 MED ORDER — DIPHENHYDRAMINE HCL 50 MG/ML IJ SOLN
12.5000 mg | Freq: Once | INTRAMUSCULAR | Status: AC
  Administered 2013-01-01: 12.5 mg via INTRAVENOUS
  Filled 2013-01-01: qty 1

## 2013-01-01 MED ORDER — SODIUM CHLORIDE 0.9 % IV BOLUS (SEPSIS)
1000.0000 mL | Freq: Once | INTRAVENOUS | Status: AC
  Administered 2013-01-01: 1000 mL via INTRAVENOUS

## 2013-01-01 NOTE — ED Provider Notes (Signed)
CSN: [id number]     Arrival date & time 01/01/13  2155 History  This chart was scribed for Gregory Goettl Smitty Cords, MD by Henri Medal, ED Scribe. This patient was seen in room MH01/MH01 and the patient's care was started at 11:00 PM.    Chief Complaint  Patient presents with  . Seizures  . Headache   Patient is a 32 y.o. male presenting with seizures. The history is provided by the patient, the spouse and a parent. The history is limited by the condition of the patient (amnesia). No language interpreter was used.  Seizures Seizure activity on arrival: no   Seizure type:  Unable to specify (wife reports rigidity and tonic clonic activity) Preceding symptoms: no sensation of an aura present   Initial focality:  None Episode characteristics: abnormal movements (jerking), generalized shaking and unresponsiveness   Postictal symptoms: memory loss   Return to baseline: yes   Severity:  Moderate Duration:  6 minutes Timing:  Once Number of seizures this episode:  1 Progression:  Resolved Context: not alcohol withdrawal, not family hx of seizures and not fever   Context comment:  Stopped narcotic pain medication 2 days ago Recent head injury:  No recent head injuries PTA treatment:  None History of seizures: no    HPI Comments: Gregory Flores is a 32 y.o. male with a history of headaches at least 3 per week who presents to the Emergency Department complaining of seizure activity that occurred 24 hours ago. Pt is unable to recollect the events of the episode so history is provided by significant other. She states around midnight last night, pt's body was shaking with jerking movements and then became frozen and stopped breathing for 5-6 minutes. Pt was unresponsive and did not wake up. Pt then took a deep breath and woke up without any recollection of what happened. Upon waking this morning, pt states he has had a right sided HA that has been constant all day this is a typical location for his  headaches. He denies a history of seizures.  He states his body jerks every night he goes to sleep but this has been ongoing for months. He denies diarrhea, nausea, vomiting, abdominal pain. No f/c/r.  No rashes no travel no tick exposure.    Pt is also seen for neuropathic pain and back problems and was last seen by a sports medicine doctor. He was prescribed gabapentin and robaxin. Pt also has a history of kidney stones and reports having lithotripsy and a surgery by [facility]. He states he took dialudid for about 1 month and would take 2 pills about 4-5 times per day. He states he stopped the dilaudid and started taking oxycodone 2 days ago. He also reports that he stopped all pain medication for the last 2 days. The only medication he has taken today was the Gabapentin but has only bee taking it twice instead of 3 times a day.  No ingestions per report.   Wife reports that she is a CNA and monitored the patient at home overnight and did not seek evaluation for the patient.  However she felt this evening like the seizure might be coming back on so she brought him in for evaluation.  Patient has not have altered mental status or seizure like activity again as of the time of evaluation Past Medical History  Diagnosis Date  . Renal disorder   . Kidney stone    Past Surgical History  Procedure Laterality Date  . Lithotripsy  History reviewed. No pertinent family history. History  Substance Use Topics  . Smoking status: Current Every Day Smoker  . Smokeless tobacco: Not on file  . Alcohol Use: Yes     Comment: rarely    Review of Systems  Constitutional: Negative for fever and diaphoresis.  HENT: Negative for neck pain and neck stiffness.   Eyes: Negative for photophobia.  Respiratory: Negative for cough and shortness of breath.   Cardiovascular: Negative for chest pain.  Gastrointestinal: Negative for nausea, vomiting, abdominal pain and diarrhea.  Neurological: Positive for  seizures and headaches (right-sided). Negative for dizziness, weakness and numbness.  All other systems reviewed and are negative.    Allergies  Review of patient's allergies indicates no known allergies.  Home Medications   Current Outpatient Rx  Name  Route  Sig  Dispense  Refill  . cefdinir (OMNICEF) 300 MG capsule   Oral   Take 1 capsule (300 mg total) by mouth 2 (two) times daily.   20 capsule   0   . gabapentin (NEURONTIN) 100 MG capsule   Oral   Take 100 mg by mouth 3 (three) times daily.         Marland Kitchen HYDROcodone-ibuprofen (VICOPROFEN) 7.5-200 MG per tablet   Oral   Take 1 tablet by mouth every 8 (eight) hours as needed for pain.   30 tablet   0   . HYDROmorphone (DILAUDID) 2 MG tablet   Oral   Take 1 tablet (2 mg total) by mouth every 4 (four) hours as needed for pain.   20 tablet   0   . ibuprofen (ADVIL,MOTRIN) 200 MG tablet   Oral   Take 200 mg by mouth every 6 (six) hours as needed. For pain         . methocarbamol (ROBAXIN) 500 MG tablet   Oral   Take 500 mg by mouth 4 (four) times daily.         . ondansetron (ZOFRAN) 4 MG tablet   Oral   Take 1 tablet (4 mg total) by mouth every 6 (six) hours.   12 tablet   0   . oxycodone (ROXICODONE) 30 MG immediate release tablet   Oral   Take 30 mg by mouth 2 (two) times daily.         . tamsulosin (FLOMAX) 0.4 MG CAPS capsule   Oral   Take 1 capsule (0.4 mg total) by mouth daily.   30 capsule   0    BP 132/75  Pulse 98  Resp 18  SpO2 100% Physical Exam  Nursing note and vitals reviewed. Constitutional: He is oriented to person, place, and time. He appears well-developed and well-nourished. No distress.  HENT:  Head: Normocephalic and atraumatic.  Mouth/Throat: Oropharynx is clear and moist.  No bite marks on the tongue.   Eyes: Conjunctivae and EOM are normal. Pupils are equal, round, and reactive to light.  Neck: Normal range of motion. Neck supple. No tracheal deviation present.   Cardiovascular: Normal rate, regular rhythm, normal heart sounds and intact distal pulses.   Pulmonary/Chest: Effort normal and breath sounds normal. No stridor. No respiratory distress. He has no wheezes. He has no rales. He exhibits no tenderness.  Abdominal: Soft. Bowel sounds are normal. There is no tenderness. There is no rebound and no guarding.  Musculoskeletal: Normal range of motion.  Lymphadenopathy:    He has no cervical adenopathy.  Neurological: He is alert and oriented to person, place, and time. He  has normal reflexes. No cranial nerve deficit. He exhibits normal muscle tone.  5/5 strength in the BU/LE  Skin: Skin is warm and dry.  Psychiatric: He has a normal mood and affect. His behavior is normal.    ED Course  Procedures (including critical care time)  DIAGNOSTIC STUDIES: Oxygen Saturation is 100% on RA, normal by my interpretation.    COORDINATION OF CARE: 11:20 PM-Will order CXR, CT of the head, CK total and CKMB, CBC, BMP, UA, drug screen. Discussed treatment plan with pt at bedside and pt agreed to plan.   Labs Review Labs Reviewed - No data to display Imaging Review No results found.  MDM  No diagnosis found. Normal CT head and CXR.  No narcotics on UDS but positive for amphetamines.  Suspect combination of amphetamine use and narcotic withdrawal as source of seizure like activity witnessed at home.  No muscle activity no seizure like activity of any kind in the ED.  CK is not elevated to suggest muscle break down as might be seen if prolonged seizure like activity.  Have advised no substances such as amphetamines as they may contribute to seizures.  Have advised immediate return to the closest  ED for any further seizures.  Call your family doctor to be seen within 2 days.  Have also advised no driving until cleared by neurology and close follow up with neurology and continued neurontin as directed.  Patient verbalizes understanding and agrees to follow up.   Bobby nurse present during patient counseling  I personally performed the services described in this documentation, which was scribed in my presence. The recorded information has been reviewed and is accurate.     Jasmine Awe, MD 01/02/13 [phone]

## 2013-01-01 NOTE — ED Notes (Signed)
Per friend, patient has an episode last night where his body was shaking while in bed, became stiff, and not breathing.  Patient today around 9am started having headache.  Patient has history of headache and he states that he gets them at least three times a week.  Alert and oriented X 3

## 2013-01-02 LAB — BASIC METABOLIC PANEL
CO2: 27 mEq/L (ref 19–32)
Calcium: 10 mg/dL (ref 8.4–10.5)
Glucose, Bld: 92 mg/dL (ref 70–99)
Sodium: 143 mEq/L (ref 135–145)

## 2013-01-02 LAB — URINALYSIS, ROUTINE W REFLEX MICROSCOPIC
Glucose, UA: NEGATIVE mg/dL
Hgb urine dipstick: NEGATIVE
Protein, ur: NEGATIVE mg/dL
Specific Gravity, Urine: 1.026 (ref 1.005–1.030)
pH: 6.5 (ref 5.0–8.0)

## 2013-01-02 LAB — CBC WITH DIFFERENTIAL/PLATELET
Eosinophils Relative: 2 % (ref 0–5)
HCT: 43.1 % (ref 39.0–52.0)
Lymphocytes Relative: 32 % (ref 12–46)
Lymphs Abs: 3.1 10*3/uL (ref 0.7–4.0)
MCV: 90.5 fL (ref 78.0–100.0)
Platelets: 244 10*3/uL (ref 150–400)
RBC: 4.76 MIL/uL (ref 4.22–5.81)
WBC: 9.7 10*3/uL (ref 4.0–10.5)

## 2013-01-02 LAB — RAPID URINE DRUG SCREEN, HOSP PERFORMED
Amphetamines: POSITIVE — AB
Benzodiazepines: NOT DETECTED
Cocaine: NOT DETECTED
Opiates: NOT DETECTED
Tetrahydrocannabinol: NOT DETECTED

## 2013-01-02 MED ORDER — KETOROLAC TROMETHAMINE 30 MG/ML IJ SOLN
30.0000 mg | Freq: Once | INTRAMUSCULAR | Status: AC
  Administered 2013-01-02: 30 mg via INTRAVENOUS
  Filled 2013-01-02: qty 1

## 2013-01-04 ENCOUNTER — Encounter: Payer: Self-pay | Admitting: Diagnostic Neuroimaging

## 2013-01-04 ENCOUNTER — Ambulatory Visit (INDEPENDENT_AMBULATORY_CARE_PROVIDER_SITE_OTHER): Payer: BC Managed Care – PPO | Admitting: Diagnostic Neuroimaging

## 2013-01-04 ENCOUNTER — Encounter: Payer: Self-pay | Admitting: *Deleted

## 2013-01-04 VITALS — BP 115/73 | HR 76 | Temp 98.4°F | Ht 69.5 in | Wt 160.0 lb

## 2013-01-04 DIAGNOSIS — R569 Unspecified convulsions: Secondary | ICD-10-CM

## 2013-01-04 NOTE — Progress Notes (Signed)
[facility]  PATIENT: Gregory Flores DOB: Jun 06, 1980  REFERRING CLINICIAN: ER HISTORY FROM: patient and wife REASON FOR VISIT: new consult   HISTORICAL  CHIEF COMPLAINT:  Chief Complaint  Patient presents with  . Seizures    HISTORY OF PRESENT ILLNESS:   32 year old right-handed male here for evaluation of seizure.  01/01/13, patient was asleep and had a possible seizure. This was witnessed by the patient's wife. She observed him to start shaking, then stiffen up, eyes open, tongue sticking out. Convulsions lasted for approximately 1-2 minutes. He then woke up and began talking. Patient is amnestic to the episode. At that day he began to develop severe headache. He went to the emergency room that evening and was evaluated. CT scan the head was unremarkable. Urine drug screen was positive for amphetamines, but patient says that he was not taking any amphetamines. He was taking some allergy medication.  In August, patient diagnosed with kidney stones status post lithotripsy. He was prescribed Dilaudid, then switched oxycodone, then stop taking this 2 days prior to seizure. He stopped taking the medication because his pain had resolved.  Patient works third shift, and has been working slightly more hours. He does have some sleep disturbances including jerking movements as he falls asleep, some leg movements during sleep, some witnessed apneas according to wife.  REVIEW OF SYSTEMS: Full 14 system review of systems performed and notable only for snoring memory loss headache sleepiness snoring restless legs weight loss fatigue.  ALLERGIES: No Known Allergies  HOME MEDICATIONS: Prior to Admission medications   Medication Sig Start Date End Date Taking? Authorizing Provider  cefdinir (OMNICEF) 300 MG capsule Take 1 capsule (300 mg total) by mouth 2 (two) times daily. 12/11/12  Yes Lonia Skinner Sofia, PA-C  gabapentin (NEURONTIN) 100 MG capsule Take 100 mg by mouth 3  (three) times daily.   Yes Historical Provider, MD  HYDROcodone-ibuprofen (VICOPROFEN) 7.5-200 MG per tablet Take 1 tablet by mouth every 8 (eight) hours as needed for pain. 11/16/12  Yes Dagmar Hait, MD  ibuprofen (ADVIL,MOTRIN) 200 MG tablet Take 200 mg by mouth every 6 (six) hours as needed. For pain   Yes Historical Provider, MD  ketorolac (TORADOL) 10 MG tablet  12/01/12  Yes Historical Provider, MD  methocarbamol (ROBAXIN) 500 MG tablet Take 500 mg by mouth 4 (four) times daily.   Yes Historical Provider, MD  ondansetron (ZOFRAN) 4 MG tablet Take 1 tablet (4 mg total) by mouth every 6 (six) hours. 12/11/12  Yes Elson Areas, PA-C  oxycodone (ROXICODONE) 30 MG immediate release tablet Take 30 mg by mouth 2 (two) times daily.   Yes Historical Provider, MD  oxyCODONE-acetaminophen (PERCOCET/ROXICET) 5-325 MG per tablet Take 1 tablet by mouth as needed. 12/17/12  Yes Historical Provider, MD  oxybutynin (DITROPAN) 5 MG tablet Take 1 tablet by mouth as needed. 12/10/12   Historical Provider, MD  phenazopyridine (PYRIDIUM) 200 MG tablet Take 1 tablet by mouth as needed. 12/09/12   Historical Provider, MD   Outpatient Prescriptions Prior to Visit  Medication Sig Dispense Refill  . cefdinir (OMNICEF) 300 MG capsule Take 1 capsule (300 mg total) by mouth 2 (two) times daily.  20 capsule  0  . gabapentin (NEURONTIN) 100 MG capsule Take 100 mg by mouth 3 (three) times daily.      Marland Kitchen HYDROcodone-ibuprofen (VICOPROFEN) 7.5-200 MG per tablet Take 1 tablet by mouth every 8 (eight) hours as needed for pain.  30 tablet  0  .  ibuprofen (ADVIL,MOTRIN) 200 MG tablet Take 200 mg by mouth every 6 (six) hours as needed. For pain      . methocarbamol (ROBAXIN) 500 MG tablet Take 500 mg by mouth 4 (four) times daily.      . ondansetron (ZOFRAN) 4 MG tablet Take 1 tablet (4 mg total) by mouth every 6 (six) hours.  12 tablet  0  . oxycodone (ROXICODONE) 30 MG immediate release tablet Take 30 mg by mouth 2 (two) times  daily.      Marland Kitchen HYDROmorphone (DILAUDID) 2 MG tablet Take 1 tablet (2 mg total) by mouth every 4 (four) hours as needed for pain.  20 tablet  0  . tamsulosin (FLOMAX) 0.4 MG CAPS capsule Take 1 capsule (0.4 mg total) by mouth daily.  30 capsule  0   No facility-administered medications prior to visit.    PAST MEDICAL HISTORY: Past Medical History  Diagnosis Date  . Renal disorder   . Kidney stone     PAST SURGICAL HISTORY: Past Surgical History  Procedure Laterality Date  . Lithotripsy      FAMILY HISTORY: No family history on file.  SOCIAL HISTORY:  History   Social History  . Marital Status: Married    Spouse Name: N/A    Number of Children: N/A  . Years of Education: N/A   Occupational History  . Not on file.   Social History Main Topics  . Smoking status: Current Every Day Smoker  . Smokeless tobacco: Not on file  . Alcohol Use: Yes     Comment: rarely  . Drug Use: No  . Sexual Activity: Not on file   Other Topics Concern  . Not on file   Social History Narrative  . No narrative on file     PHYSICAL EXAM  Filed Vitals:   01/04/13 1007  BP: 115/73  Pulse: 76  Temp: 98.4 F (36.9 C)  TempSrc: Oral  Height: 5' 9.5" (1.765 m)  Weight: 160 lb (72.576 kg)    Not recorded    Body mass index is 23.3 kg/(m^2).  GENERAL EXAM: Patient is in no distress; SMELLS OF CIG SMOKE.  CARDIOVASCULAR: Regular rate and rhythm, no murmurs, no carotid bruits  NEUROLOGIC: MENTAL STATUS: awake, alert, language fluent, comprehension intact, naming intact CRANIAL NERVE: no papilledema on fundoscopic exam, pupils equal and reactive to light, visual fields full to confrontation, extraocular muscles intact, no nystagmus, facial sensation and strength symmetric, uvula midline, shoulder shrug symmetric, tongue midline. MOTOR: normal bulk and tone, full strength in the BUE, BLE SENSORY: normal and symmetric to light touch, pinprick, temperature, vibration COORDINATION:  finger-nose-finger, fine finger movements normal REFLEXES: deep tendon reflexes present and symmetric GAIT/STATION: narrow based gait; able to walk on toes, heels and tandem; romberg is negative   DIAGNOSTIC DATA (LABS, IMAGING, TESTING) - I reviewed patient records, labs, notes, testing and imaging myself where available.  Lab Results  Component Value Date   WBC 9.7 01/01/2013   HGB 14.9 01/01/2013   HCT 43.1 01/01/2013   MCV 90.5 01/01/2013   PLT 244 01/01/2013      Component Value Date/Time   NA 143 01/01/2013 2355   K 3.7 01/01/2013 2355   CL 104 01/01/2013 2355   CO2 27 01/01/2013 2355   GLUCOSE 92 01/01/2013 2355   BUN 16 01/01/2013 2355   CREATININE 1.10 01/01/2013 2355   CALCIUM 10.0 01/01/2013 2355   PROT 7.2 03/23/2010 0214   ALBUMIN 4.4 03/23/2010 0214  AST 22 03/23/2010 0214   ALT 29 03/23/2010 0214   ALKPHOS 76 03/23/2010 0214   BILITOT 0.6 03/23/2010 0214   GFRNONAA 89* 01/01/2013 2355   GFRAA >90 01/01/2013 2355   No results found for this basename: CHOL, HDL, LDLCALC, LDLDIRECT, TRIG, CHOLHDL   No results found for this basename: HGBA1C   No results found for this basename: VITAMINB12   No results found for this basename: TSH    01/01/13 CT HEAD - NORMAL   ASSESSMENT AND PLAN  32 y.o. year old male here with new-onset convulsion on 01/01/13. This could have been provoked by narcotic withdrawal. Urine drug screen was positive for amphetamines although patient denies taking any amphetamines. We'll proceed with further testing.  PLAN: - MRI brain, EEG - No driving until further notice; starting point would be 6 months event free before releasing patient back to driving. Because this may have a provoked event if MRI and EEG are normal we may reduce that waiting period to 3 months. - Caution with work; patient works in a hazardous environment around toxic chemicals, Horticulturist, commercial at times. 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   Orders Placed This Encounter  Procedures  . MR Brain W Wo Contrast  . EEG adult   Return in about 3 months (around 04/05/2013).    Suanne Marker, MD 01/04/2013, 11:08 AM Certified in Neurology, Neurophysiology and Neuroimaging  [facility] [address] [place], [place] [address] [phone]

## 2013-01-04 NOTE — Patient Instructions (Signed)
No driving until further notice.  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  I will check EEG and MRI.

## 2013-01-12 ENCOUNTER — Other Ambulatory Visit (INDEPENDENT_AMBULATORY_CARE_PROVIDER_SITE_OTHER): Payer: BC Managed Care – PPO | Admitting: Radiology

## 2013-01-12 DIAGNOSIS — R569 Unspecified convulsions: Secondary | ICD-10-CM

## 2013-01-16 ENCOUNTER — Telehealth: Payer: Self-pay | Admitting: Diagnostic Neuroimaging

## 2013-01-16 DIAGNOSIS — Z0289 Encounter for other administrative examinations: Secondary | ICD-10-CM

## 2013-01-17 ENCOUNTER — Telehealth: Payer: Self-pay | Admitting: Diagnostic Neuroimaging

## 2013-01-18 NOTE — Telephone Encounter (Signed)
Requesting EEG results. Returned Brittany's call to confirmed message recvd and fwd. No answer.

## 2013-01-19 ENCOUNTER — Telehealth: Payer: Self-pay | Admitting: Diagnostic Neuroimaging

## 2013-01-19 ENCOUNTER — Ambulatory Visit (INDEPENDENT_AMBULATORY_CARE_PROVIDER_SITE_OTHER): Payer: BC Managed Care – PPO

## 2013-01-19 DIAGNOSIS — R569 Unspecified convulsions: Secondary | ICD-10-CM

## 2013-01-19 MED ORDER — GADOPENTETATE DIMEGLUMINE 469.01 MG/ML IV SOLN: 15.0000 mL | Freq: Once | INTRAVENOUS | Status: AC | PRN

## 2013-01-20 NOTE — Telephone Encounter (Signed)
Patient stopped by check out for EEG results after having MRI.

## 2013-01-24 NOTE — Telephone Encounter (Signed)
I called and let patient know that his forms should be complete by this Friday. I will call him when they are complete.   At present, I do not see that patient's EEG has been reviewed. I will request that Dr. Marjory Lies review.  I will also provide him with the findings of his EEG when I call to notify him that his forms have been completed.

## 2013-02-01 NOTE — Telephone Encounter (Signed)
Called patient.  No answer.

## 2013-02-01 NOTE — Telephone Encounter (Signed)
I called pt and LMVM on cell #.  I then called and spoke to mother De Hollingshead) after speaking with Dr. Marjory Lies.  EEG normal, MRI normal.  Pt, if episode free may return to work and driving after 3 months RV on 04-03-13 per Dr. Marjory Lies.  Moved up RV appt to 04-03-13 at 1100 with Dr. Marjory Lies.  (3 months from 01-01-13).  Forms for [employer] signed and ready. I gave to Lupita Leash in MR.  LMVM for pt or family member to pick up today.  If questions to return call.

## 2013-02-02 ENCOUNTER — Telehealth: Payer: Self-pay | Admitting: *Deleted

## 2013-02-02 NOTE — Telephone Encounter (Signed)
Sammy called to request that his completed Short term Disability form be faxed to his father-in-law Meribeth Mattes at fax number: [phone]. If you have any questions please call Koron at [phone]

## 2013-02-02 NOTE — Telephone Encounter (Signed)
Faxed short term disability form to Meribeth Mattes per patient's request,called patient to inform

## 2013-02-27 ENCOUNTER — Telehealth: Payer: Self-pay

## 2013-02-27 NOTE — Telephone Encounter (Signed)
Patient returned called and asked that forms be faxed right to [facility]. I have faxed them.

## 2013-02-27 NOTE — Telephone Encounter (Signed)
I called and left VM, please let me know if you want to pick up [employer] addendum or fax it.

## 2013-04-03 ENCOUNTER — Ambulatory Visit: Payer: Self-pay | Admitting: Diagnostic Neuroimaging

## 2013-04-04 ENCOUNTER — Ambulatory Visit (INDEPENDENT_AMBULATORY_CARE_PROVIDER_SITE_OTHER): Payer: BC Managed Care – PPO | Admitting: Diagnostic Neuroimaging

## 2013-04-04 ENCOUNTER — Encounter: Payer: Self-pay | Admitting: Diagnostic Neuroimaging

## 2013-04-04 ENCOUNTER — Encounter (INDEPENDENT_AMBULATORY_CARE_PROVIDER_SITE_OTHER): Payer: Self-pay

## 2013-04-04 VITALS — BP 130/81 | HR 92 | Temp 97.3°F | Ht 67.0 in | Wt 152.0 lb

## 2013-04-04 DIAGNOSIS — R569 Unspecified convulsions: Secondary | ICD-10-CM

## 2013-04-04 NOTE — Progress Notes (Signed)
[facility]  PATIENT: Gregory Flores DOB: [birth year]  REFERRING CLINICIAN: ER HISTORY FROM: patient and wife REASON FOR VISIT: new consult   HISTORICAL  CHIEF COMPLAINT:  Chief Complaint  Patient presents with  . Seizures    HISTORY OF PRESENT ILLNESS:   UPDATE 04/04/13: Since last visit, doing well. No further events. Has been out of work on disability. MRI and EEG reviewed --> normal. Patient feels ready to go back to work when released.  PRIOR HPI (01/04/13): 32 year old right-handed male here for evaluation of seizure.  01/01/13, patient was asleep and had a possible seizure. This was witnessed by the patient's wife. She observed him to start shaking, then stiffen up, eyes open, tongue sticking out. Convulsions lasted for approximately 1-2 minutes. He then woke up and began talking. Patient is amnestic to the episode. At that day he began to develop severe headache. He went to the emergency room that evening and was evaluated. CT scan the head was unremarkable. Urine drug screen was positive for amphetamines, but patient says that he was not taking any amphetamines. He was taking some allergy medication.  In August, patient diagnosed with kidney stones status post lithotripsy. He was prescribed Dilaudid, then switched oxycodone, then stop taking this 2 days prior to seizure. He stopped taking the medication because his pain had resolved.  Patient works third shift, and has been working slightly more hours. He does have some sleep disturbances including jerking movements as he falls asleep, some leg movements during sleep, some witnessed apneas according to wife.  REVIEW OF SYSTEMS: Full 14 system review of systems performed and notable only for headache.  ALLERGIES: No Known Allergies  HOME MEDICATIONS: Outpatient Encounter Prescriptions as of 04/04/2013  Medication Sig  . ibuprofen (ADVIL,MOTRIN) 200 MG tablet Take 200 mg by mouth every 6 (six) hours as  needed. For pain  . methocarbamol (ROBAXIN) 500 MG tablet Take 500 mg by mouth 4 (four) times daily.  . ondansetron (ZOFRAN) 4 MG tablet Take 1 tablet (4 mg total) by mouth every 6 (six) hours.  Marland Kitchen oxyCODONE-acetaminophen (PERCOCET/ROXICET) 5-325 MG per tablet Take 1 tablet by mouth as needed.  . [DISCONTINUED] cefdinir (OMNICEF) 300 MG capsule Take 1 capsule (300 mg total) by mouth 2 (two) times daily.  . [DISCONTINUED] gabapentin (NEURONTIN) 100 MG capsule Take 100 mg by mouth 3 (three) times daily.  . [DISCONTINUED] HYDROcodone-ibuprofen (VICOPROFEN) 7.5-200 MG per tablet Take 1 tablet by mouth every 8 (eight) hours as needed for pain.  . [DISCONTINUED] ketorolac (TORADOL) 10 MG tablet   . [DISCONTINUED] oxybutynin (DITROPAN) 5 MG tablet Take 1 tablet by mouth as needed.  . [DISCONTINUED] oxycodone (ROXICODONE) 30 MG immediate release tablet Take 30 mg by mouth 2 (two) times daily.  . [DISCONTINUED] phenazopyridine (PYRIDIUM) 200 MG tablet Take 1 tablet by mouth as needed.    PAST MEDICAL HISTORY: Past Medical History  Diagnosis Date  . Renal disorder   . Kidney stone     PAST SURGICAL HISTORY: Past Surgical History  Procedure Laterality Date  . Lithotripsy      FAMILY HISTORY: History reviewed. No pertinent family history.  SOCIAL HISTORY:  History   Social History  . Marital Status: Married    Spouse Name: [place]    Number of Children: 1  . Years of Education: GED   Occupational History  . BATCHER    Social History Main Topics  . Smoking status: Current Every Day Smoker -- 0.50 packs/day for 7 years  Types: Cigarettes  . Smokeless tobacco: Never Used  . Alcohol Use: Yes     Comment: rarely  . Drug Use: No  . Sexual Activity: Not on file   Other Topics Concern  . Not on file   Social History Narrative   Patient lives at home with family.   Caffeine Use: 2 sodas weekly     PHYSICAL EXAM  Filed Vitals:   04/04/13 1112  BP: 130/81  Pulse: 92    Temp: 97.3 F (36.3 C)  TempSrc: Oral  Height: 5\' 7"  (1.702 m)  Weight: 152 lb (68.947 kg)    Not recorded    Body mass index is 23.8 kg/(m^2).  GENERAL EXAM: Patient is in no distress; SMELLS OF CIG SMOKE.  CARDIOVASCULAR: Regular rate and rhythm, no murmurs, no carotid bruits  NEUROLOGIC: MENTAL STATUS: awake, alert, language fluent, comprehension intact, naming intact CRANIAL NERVE: no papilledema on fundoscopic exam, pupils equal and reactive to light, visual fields full to confrontation, extraocular muscles intact, no nystagmus, facial sensation and strength symmetric, uvula midline, shoulder shrug symmetric, tongue midline. MOTOR: normal bulk and tone, full strength in the BUE, BLE SENSORY: normal and symmetric to light touch, pinprick, temperature, vibration COORDINATION: finger-nose-finger, fine finger movements normal REFLEXES: deep tendon reflexes present and symmetric GAIT/STATION: narrow based gait; able to walk on toes, heels and tandem; romberg is negative   DIAGNOSTIC DATA (LABS, IMAGING, TESTING) - I reviewed patient records, labs, notes, testing and imaging myself where available.  Lab Results  Component Value Date   WBC 9.7 01/01/2013   HGB 14.9 01/01/2013   HCT 43.1 01/01/2013   MCV 90.5 01/01/2013   PLT 244 01/01/2013      Component Value Date/Time   NA 143 01/01/2013 2355   K 3.7 01/01/2013 2355   CL 104 01/01/2013 2355   CO2 27 01/01/2013 2355   GLUCOSE 92 01/01/2013 2355   BUN 16 01/01/2013 2355   CREATININE 1.10 01/01/2013 2355   CALCIUM 10.0 01/01/2013 2355   PROT 7.2 03/23/2010 0214   ALBUMIN 4.4 03/23/2010 0214   AST 22 03/23/2010 0214   ALT 29 03/23/2010 0214   ALKPHOS 76 03/23/2010 0214   BILITOT 0.6 03/23/2010 0214   GFRNONAA 89* 01/01/2013 2355   GFRAA >90 01/01/2013 2355   No results found for this basename: CHOL,  HDL,  LDLCALC,  LDLDIRECT,  TRIG,  CHOLHDL   No results found for this basename: HGBA1C   No results found for this  basename: VITAMINB12   No results found for this basename: TSH    01/01/13 CT HEAD - normal  01/12/13 EEG - normal  01/19/13 MRI brain (with and without) - normal   ASSESSMENT AND PLAN  32 y.o. year old male here with new-onset convulsion on 01/01/13. This could have been provoked by narcotic withdrawal. Urine drug screen was positive for amphetamines although patient denies taking any amphetamines. MRI and EEG normal. No further events.  Dx: provoked seizure (narcotic withdrawal)  PLAN: - may return to driving immediately - may return to work, no restrictions, on Apr 17, 2012; caution with work as patient works in a hazardous environment around toxic chemicals, Horticulturist, commercial at times.  Return in about 6 months (around 10/03/2013).    Suanne Marker, MD 04/04/2013, 11:38 AM Certified in Neurology, Neurophysiology and Neuroimaging  [facility] [address] [place], [place] [address] [phone]

## 2013-04-04 NOTE — Patient Instructions (Signed)
You may return to driving immediately. Use caution if you are tired or feeling sick.  You may return to work, with no restrictions, on April 17, 2013.

## 2013-04-11 ENCOUNTER — Ambulatory Visit: Payer: BC Managed Care – PPO | Admitting: Diagnostic Neuroimaging

## 2013-04-24 DIAGNOSIS — Z0289 Encounter for other administrative examinations: Secondary | ICD-10-CM

## 2013-08-28 ENCOUNTER — Encounter (HOSPITAL_BASED_OUTPATIENT_CLINIC_OR_DEPARTMENT_OTHER): Payer: Self-pay | Admitting: Emergency Medicine

## 2013-08-28 ENCOUNTER — Emergency Department (HOSPITAL_BASED_OUTPATIENT_CLINIC_OR_DEPARTMENT_OTHER)
Admission: EM | Admit: 2013-08-28 | Discharge: 2013-08-28 | Disposition: A | Discharge status: Home / Self Care | Payer: BC Managed Care – PPO | Attending: Emergency Medicine | Admitting: Emergency Medicine

## 2013-08-28 DIAGNOSIS — Z9889 Other specified postprocedural states: Secondary | ICD-10-CM | POA: Insufficient documentation

## 2013-08-28 DIAGNOSIS — N200 Calculus of kidney: Secondary | ICD-10-CM

## 2013-08-28 DIAGNOSIS — F17200 Nicotine dependence, unspecified, uncomplicated: Secondary | ICD-10-CM | POA: Insufficient documentation

## 2013-08-28 LAB — URINALYSIS, ROUTINE W REFLEX MICROSCOPIC
Bilirubin Urine: NEGATIVE
GLUCOSE, UA: NEGATIVE mg/dL
Hgb urine dipstick: NEGATIVE
Ketones, ur: NEGATIVE mg/dL
LEUKOCYTES UA: NEGATIVE
NITRITE: NEGATIVE
Protein, ur: NEGATIVE mg/dL
SPECIFIC GRAVITY, URINE: 1.019 (ref 1.005–1.030)
Urobilinogen, UA: 0.2 mg/dL (ref 0.0–1.0)
pH: 6 (ref 5.0–8.0)

## 2013-08-28 MED ORDER — HYDROMORPHONE HCL PF 2 MG/ML IJ SOLN
2.0000 mg | Freq: Once | INTRAMUSCULAR | Status: AC
  Administered 2013-08-28: 2 mg via INTRAMUSCULAR
  Filled 2013-08-28: qty 1

## 2013-08-28 MED ORDER — KETOROLAC TROMETHAMINE 60 MG/2ML IM SOLN
60.0000 mg | Freq: Once | INTRAMUSCULAR | Status: AC
  Administered 2013-08-28: 60 mg via INTRAMUSCULAR
  Filled 2013-08-28: qty 2

## 2013-08-28 MED ORDER — OXYCODONE-ACETAMINOPHEN 5-325 MG PO TABS: 1.0000 | ORAL_TABLET | Freq: Four times a day (QID) | ORAL | Status: DC | PRN

## 2013-08-28 NOTE — ED Notes (Signed)
Right sided flank pain since last night worse this am.  Some nausea.  No vomitting.  No fever.

## 2013-08-28 NOTE — ED Provider Notes (Addendum)
CSN: [id number]     Arrival date & time 08/28/13  1015 History   First MD Initiated Contact with Patient 08/28/13 1017     Chief Complaint  Patient presents with  . Flank Pain     (Consider location/radiation/quality/duration/timing/severity/associated sxs/prior Treatment) Patient is a 33 y.o. male presenting with flank pain.  Flank Pain   Pt with history of kidney stones states he was seen by his Urologist last week and told he had 'five kidney stones' on the right seen on a KUB. Has had severe persistent R flank pain since that time, not relieved with oxycodone at home. He is reportedly scheduled for a procedure to be done in June. Has had some burning, but no fever or vomiting.  Past Medical History  Diagnosis Date  . Renal disorder   . Kidney stone    Past Surgical History  Procedure Laterality Date  . Lithotripsy     No family history on file. History  Substance Use Topics  . Smoking status: Current Every Day Smoker -- 0.50 packs/day for 7 years    Types: Cigarettes  . Smokeless tobacco: Never Used  . Alcohol Use: Yes     Comment: rarely    Review of Systems  Genitourinary: Positive for flank pain.   All other systems reviewed and are negative except as noted in HPI.     Allergies  Review of patient's allergies indicates no known allergies.  Home Medications   Prior to Admission medications   Medication Sig Start Date End Date Taking? Authorizing Provider  oxyCODONE-acetaminophen (PERCOCET/ROXICET) 5-325 MG per tablet Take 1 tablet by mouth as needed. 12/17/12   Historical Provider, MD   BP 128/104  Pulse 98  Temp(Src) 99.3 F (37.4 C) (Oral)  Resp 18  SpO2 97% Physical Exam  Nursing note and vitals reviewed. Constitutional: He is oriented to person, place, and time. He appears well-developed and well-nourished.  Uncomfortable appearing  HENT:  Head: Normocephalic and atraumatic.  Eyes: EOM are normal. Pupils are equal, round, and reactive to light.   Neck: Normal range of motion. Neck supple.  Cardiovascular: Normal rate, normal heart sounds and intact distal pulses.   Pulmonary/Chest: Effort normal and breath sounds normal.  Abdominal: Bowel sounds are normal. He exhibits no distension. There is no tenderness.  Musculoskeletal: Normal range of motion. He exhibits no edema and no tenderness.  Neurological: He is alert and oriented to person, place, and time. He has normal strength. No cranial nerve deficit or sensory deficit.  Skin: Skin is warm and dry. No rash noted.  Psychiatric: He has a normal mood and affect.    ED Course  Procedures (including critical care time) Labs Review Labs Reviewed  URINALYSIS, ROUTINE W REFLEX MICROSCOPIC    Imaging Review No results found.   EKG Interpretation None      MDM   Final diagnoses:  Kidney stone    Pain improved. I attempted to contact his urologist, [place]. Cleatrice Burkeoughlin, [place] [facility] [place] unsuccessfully. He has oxycodone at home, advised to take Motrin and drink plenty of fluid. Followup with Urology if pain continues.    Laura Caldas B. Bernette MayersSheldon, MD 08/28/13 1105  PA from [facility] called back. States he did not think the patient will be able to get lithotripsy in the next 7 days so Toradol will be fine today. Followup in their office if pain continues.   Demetruis Depaul B. Bernette MayersSheldon, MD 08/28/13 1112

## 2013-08-28 NOTE — Discharge Instructions (Signed)
Kidney Stones  Kidney stones (urolithiasis) are deposits that form inside your kidneys. The intense pain is caused by the stone moving through the urinary tract. When the stone moves, the ureter goes into spasm around the stone. The stone is usually passed in the urine.   CAUSES   · A disorder that makes certain neck glands produce too much parathyroid hormone (primary hyperparathyroidism).  · A buildup of uric acid crystals, similar to gout in your joints.  · Narrowing (stricture) of the ureter.  · A kidney obstruction present at birth (congenital obstruction).  · Previous surgery on the kidney or ureters.  · Numerous kidney infections.  SYMPTOMS   · Feeling sick to your stomach (nauseous).  · Throwing up (vomiting).  · Blood in the urine (hematuria).  · Pain that usually spreads (radiates) to the groin.  · Frequency or urgency of urination.  DIAGNOSIS   · Taking a history and physical exam.  · Blood or urine tests.  · CT scan.  · Occasionally, an examination of the inside of the urinary bladder (cystoscopy) is performed.  TREATMENT   · Observation.  · Increasing your fluid intake.  · Extracorporeal shock wave lithotripsy This is a noninvasive procedure that uses shock waves to break up kidney stones.  · Surgery may be needed if you have severe pain or persistent obstruction. There are various surgical procedures. Most of the procedures are performed with the use of small instruments. Only small incisions are needed to accommodate these instruments, so recovery time is minimized.  The size, location, and chemical composition are all important variables that will determine the proper choice of action for you. Talk to your health care provider to better understand your situation so that you will minimize the risk of injury to yourself and your kidney.   HOME CARE INSTRUCTIONS   · Drink enough water and fluids to keep your urine clear or pale yellow. This will help you to pass the stone or stone fragments.  · Strain  all urine through the provided strainer. Keep all particulate matter and stones for your health care provider to see. The stone causing the pain may be as small as a grain of salt. It is very important to use the strainer each and every time you pass your urine. The collection of your stone will allow your health care provider to analyze it and verify that a stone has actually passed. The stone analysis will often identify what you can do to reduce the incidence of recurrences.  · Only take over-the-counter or prescription medicines for pain, discomfort, or fever as directed by your health care provider.  · Make a follow-up appointment with your health care provider as directed.  · Get follow-up X-rays if required. The absence of pain does not always mean that the stone has passed. It may have only stopped moving. If the urine remains completely obstructed, it can cause loss of kidney function or even complete destruction of the kidney. It is your responsibility to make sure X-rays and follow-ups are completed. Ultrasounds of the kidney can show blockages and the status of the kidney. Ultrasounds are not associated with any radiation and can be performed easily in a matter of minutes.  SEEK MEDICAL CARE IF:  · You experience pain that is progressive and unresponsive to any pain medicine you have been prescribed.  SEEK IMMEDIATE MEDICAL CARE IF:   · Pain cannot be controlled with the prescribed medicine.  · You have a fever   or shaking chills.  · The severity or intensity of pain increases over 18 hours and is not relieved by pain medicine.  · You develop a new onset of abdominal pain.  · You feel faint or pass out.  · You are unable to urinate.  MAKE SURE YOU:   · Understand these instructions.  · Will watch your condition.  · Will get help right away if you are not doing well or get worse.  Document Released: 03/30/2005 Document Revised: 11/30/2012 Document Reviewed: 08/31/2012  ExitCare® Patient Information ©2014  [employer].

## 2013-09-21 ENCOUNTER — Encounter: Payer: Self-pay | Admitting: Diagnostic Neuroimaging

## 2013-09-21 ENCOUNTER — Telehealth: Payer: Self-pay | Admitting: Diagnostic Neuroimaging

## 2013-09-21 NOTE — Telephone Encounter (Signed)
Left message for patient regarding rescheduling 10/03/13 appointment per Dr. Richrd Humbles schedule, printed and mailed letter with new appointment time.

## 2013-10-03 ENCOUNTER — Ambulatory Visit: Payer: BC Managed Care – PPO | Admitting: Diagnostic Neuroimaging

## 2013-10-05 ENCOUNTER — Other Ambulatory Visit: Payer: Self-pay

## 2013-10-05 ENCOUNTER — Emergency Department (HOSPITAL_BASED_OUTPATIENT_CLINIC_OR_DEPARTMENT_OTHER)
Admission: EM | Admit: 2013-10-05 | Discharge: 2013-10-05 | Disposition: A | Discharge status: Home / Self Care | Payer: Managed Care, Other (non HMO) | Attending: Emergency Medicine | Admitting: Emergency Medicine

## 2013-10-05 ENCOUNTER — Encounter (HOSPITAL_BASED_OUTPATIENT_CLINIC_OR_DEPARTMENT_OTHER): Payer: Self-pay | Admitting: Emergency Medicine

## 2013-10-05 ENCOUNTER — Emergency Department (HOSPITAL_BASED_OUTPATIENT_CLINIC_OR_DEPARTMENT_OTHER): Payer: Managed Care, Other (non HMO)

## 2013-10-05 DIAGNOSIS — R109 Unspecified abdominal pain: Secondary | ICD-10-CM | POA: Insufficient documentation

## 2013-10-05 DIAGNOSIS — R064 Hyperventilation: Secondary | ICD-10-CM | POA: Insufficient documentation

## 2013-10-05 DIAGNOSIS — Z87448 Personal history of other diseases of urinary system: Secondary | ICD-10-CM | POA: Insufficient documentation

## 2013-10-05 DIAGNOSIS — Z87442 Personal history of urinary calculi: Secondary | ICD-10-CM | POA: Insufficient documentation

## 2013-10-05 DIAGNOSIS — F17200 Nicotine dependence, unspecified, uncomplicated: Secondary | ICD-10-CM | POA: Insufficient documentation

## 2013-10-05 LAB — CBC WITH DIFFERENTIAL/PLATELET
BASOS ABS: 0 10*3/uL (ref 0.0–0.1)
BASOS PCT: 0 % (ref 0–1)
Eosinophils Absolute: 0.2 10*3/uL (ref 0.0–0.7)
Eosinophils Relative: 2 % (ref 0–5)
HCT: 39.4 % (ref 39.0–52.0)
HEMOGLOBIN: 13.5 g/dL (ref 13.0–17.0)
Lymphocytes Relative: 31 % (ref 12–46)
Lymphs Abs: 3.6 10*3/uL (ref 0.7–4.0)
MCH: 31.8 pg (ref 26.0–34.0)
MCHC: 34.3 g/dL (ref 30.0–36.0)
MCV: 92.9 fL (ref 78.0–100.0)
Monocytes Absolute: 0.9 10*3/uL (ref 0.1–1.0)
Monocytes Relative: 8 % (ref 3–12)
NEUTROS ABS: 7.1 10*3/uL (ref 1.7–7.7)
NEUTROS PCT: 60 % (ref 43–77)
Platelets: 254 10*3/uL (ref 150–400)
RBC: 4.24 MIL/uL (ref 4.22–5.81)
RDW: 12.7 % (ref 11.5–15.5)
WBC: 11.8 10*3/uL — ABNORMAL HIGH (ref 4.0–10.5)

## 2013-10-05 LAB — URINE MICROSCOPIC-ADD ON

## 2013-10-05 LAB — URINALYSIS, ROUTINE W REFLEX MICROSCOPIC
Bilirubin Urine: NEGATIVE
Glucose, UA: NEGATIVE mg/dL
Hgb urine dipstick: NEGATIVE
Ketones, ur: NEGATIVE mg/dL
LEUKOCYTES UA: NEGATIVE
Nitrite: NEGATIVE
PH: 6 (ref 5.0–8.0)
Protein, ur: 30 mg/dL — AB
SPECIFIC GRAVITY, URINE: 1.036 — AB (ref 1.005–1.030)
Urobilinogen, UA: 0.2 mg/dL (ref 0.0–1.0)

## 2013-10-05 LAB — BASIC METABOLIC PANEL
BUN: 15 mg/dL (ref 6–23)
CHLORIDE: 104 meq/L (ref 96–112)
CO2: 24 mEq/L (ref 19–32)
Calcium: 9.8 mg/dL (ref 8.4–10.5)
Creatinine, Ser: 1.2 mg/dL (ref 0.50–1.35)
GFR calc non Af Amer: 80 mL/min — ABNORMAL LOW (ref >90)
Glucose, Bld: 93 mg/dL (ref 70–99)
POTASSIUM: 3.9 meq/L (ref 3.7–5.3)
Sodium: 143 mEq/L (ref 137–147)

## 2013-10-05 MED ORDER — HYDROMORPHONE HCL PF 1 MG/ML IJ SOLN
1.0000 mg | Freq: Once | INTRAMUSCULAR | Status: AC
  Administered 2013-10-05: 1 mg via INTRAVENOUS
  Filled 2013-10-05: qty 1

## 2013-10-05 MED ORDER — OXYCODONE-ACETAMINOPHEN 5-325 MG PO TABS
2.0000 | ORAL_TABLET | Freq: Once | ORAL | Status: AC
  Administered 2013-10-05: 2 via ORAL

## 2013-10-05 MED ORDER — ONDANSETRON HCL 4 MG/2ML IJ SOLN
4.0000 mg | Freq: Once | INTRAMUSCULAR | Status: AC
  Administered 2013-10-05: 4 mg via INTRAVENOUS
  Filled 2013-10-05: qty 2

## 2013-10-05 MED ORDER — TAMSULOSIN HCL 0.4 MG PO CAPS: 0.4000 mg | ORAL_CAPSULE | Freq: Every day | ORAL | Status: DC

## 2013-10-05 MED ORDER — KETOROLAC TROMETHAMINE 30 MG/ML IJ SOLN
INTRAMUSCULAR | Status: AC
  Filled 2013-10-05: qty 1

## 2013-10-05 MED ORDER — OXYCODONE-ACETAMINOPHEN 5-325 MG PO TABS
ORAL_TABLET | ORAL | Status: DC
Start: 2013-10-05 — End: 2013-10-05
  Filled 2013-10-05: qty 2

## 2013-10-05 MED ORDER — KETOROLAC TROMETHAMINE 30 MG/ML IJ SOLN
30.0000 mg | Freq: Once | INTRAMUSCULAR | Status: AC
  Administered 2013-10-05: 30 mg via INTRAVENOUS

## 2013-10-05 MED ORDER — OXYCODONE-ACETAMINOPHEN 5-325 MG PO TABS: 2.0000 | ORAL_TABLET | ORAL | Status: DC | PRN

## 2013-10-05 NOTE — ED Provider Notes (Signed)
CSN: [id number]     Arrival date & time 10/05/13  1251 History   First MD Initiated Contact with Patient 10/05/13 1304     Chief Complaint  Patient presents with  . Flank Pain     (Consider location/radiation/quality/duration/timing/severity/associated sxs/prior Treatment) HPI Comments: Presented to the ER for evaluation of left flank pain. Patient reports that he had sudden onset of left flank pain which is severe and constant. Pain is in the lower back on the left and radiates around the side. He reports that this is similar to pain he has had with kidney stones in the past. He has required lithotripsy for previous stones. Patient has nausea no vomiting. He has been seeing blood in his urine.  Patient is a 33 y.o. male presenting with flank pain.  Flank Pain    Past Medical History  Diagnosis Date  . Renal disorder   . Kidney stone    Past Surgical History  Procedure Laterality Date  . Lithotripsy     History reviewed. No pertinent family history. History  Substance Use Topics  . Smoking status: Current Every Day Smoker -- 0.50 packs/day for 7 years    Types: Cigarettes  . Smokeless tobacco: Never Used  . Alcohol Use: Yes     Comment: rarely    Review of Systems  Genitourinary: Positive for flank pain.  All other systems reviewed and are negative.     Allergies  Review of patient's allergies indicates no known allergies.  Home Medications   Prior to Admission medications   Not on File   Pulse 115  Temp(Src) 98.5 F (36.9 C) (Oral)  Resp 30  Ht 5\' 9"  (1.753 m)  Wt 155 lb (70.308 kg)  BMI 22.88 kg/m2  SpO2 97% Physical Exam  Constitutional: He is oriented to person, place, and time. He appears well-developed and well-nourished. He appears distressed (Hyperventilating).  HENT:  Head: Normocephalic and atraumatic.  Right Ear: Hearing normal.  Left Ear: Hearing normal.  Nose: Nose normal.  Mouth/Throat: Oropharynx is clear and moist and mucous membranes  are normal.  Eyes: Conjunctivae and EOM are normal. Pupils are equal, round, and reactive to light.  Neck: Normal range of motion. Neck supple.  Cardiovascular: Regular rhythm, S1 normal and S2 normal.  Exam reveals no gallop and no friction rub.   No murmur heard. Pulmonary/Chest: Effort normal and breath sounds normal. No respiratory distress. He exhibits no tenderness.  Abdominal: Soft. Normal appearance and bowel sounds are normal. There is no hepatosplenomegaly. There is no tenderness. There is no rebound, no guarding, no tenderness at McBurney's point and negative Murphy's sign. No hernia.  Musculoskeletal: Normal range of motion.  Neurological: He is alert and oriented to person, place, and time. He has normal strength. No cranial nerve deficit or sensory deficit. Coordination normal. GCS eye subscore is 4. GCS verbal subscore is 5. GCS motor subscore is 6.  Skin: Skin is warm, dry and intact. No rash noted. No cyanosis.  Psychiatric: He has a normal mood and affect. His speech is normal and behavior is normal. Thought content normal.    ED Course  Procedures (including critical care time) Labs Review Labs Reviewed  CBC WITH DIFFERENTIAL  BASIC METABOLIC PANEL  URINALYSIS, ROUTINE W REFLEX MICROSCOPIC    Imaging Review No results found.   EKG Interpretation None      MDM   Final diagnoses:  None  Flank Pain   Patient presents to the ER for evaluation of left flank  pain. He reports sudden onset pain in the left flank area that is similar to a kidney stone she has had previously. His urinalysis did not show any sign of infection. There was no significant microscopic hematuria either. A KUB was performed because he was told that he had stones seen on KUB previously. No obvious stones were noted. This still could be renal colic secondary to a stone that has partially obstructed the renal pelvis intermittently. I do not think he has an impacted ureteral stone, however. Patient  is unlikely to be able to have lithotripsy before the weekend his outcome and therefore I feel is safe to give him Toradol for some pain relief. He will be given a prescription for Flomax and Percocet, followup with his urologist Monday.   Gilda Creasehristopher J. Pollina, MD 10/05/13 1415

## 2013-10-05 NOTE — ED Notes (Signed)
Pt c/o left side flank pain sudden onset HX kidney stones

## 2013-10-05 NOTE — ED Notes (Signed)
MD at bedside. 

## 2013-10-05 NOTE — ED Notes (Signed)
Patient transported to X-ray 

## 2013-10-05 NOTE — Discharge Instructions (Signed)
Flank Pain °Flank pain refers to pain that is located on the side of the body between the upper abdomen and the back. The pain may occur over a short period of time (acute) or may be long-term or reoccurring (chronic). It may be mild or severe. Flank pain can be caused by many things. °CAUSES  °Some of the more common causes of flank pain include: °· Muscle strains.   °· Muscle spasms.   °· A disease of your spine (vertebral disk disease).   °· A lung infection (pneumonia).   °· Fluid around your lungs (pulmonary edema).   °· A kidney infection.   °· Kidney stones.   °· A very painful skin rash caused by the chickenpox virus (shingles).   °· Gallbladder disease.   °HOME CARE INSTRUCTIONS  °Home care will depend on the cause of your pain. In general, °· Rest as directed by your caregiver. °· Drink enough fluids to keep your urine clear or pale yellow. °· Only take over-the-counter or prescription medicines as directed by your caregiver. Some medicines may help relieve the pain. °· Tell your caregiver about any changes in your pain. °· Follow up with your caregiver as directed. °SEEK IMMEDIATE MEDICAL CARE IF:  °· Your pain is not controlled with medicine.   °· You have new or worsening symptoms. °· Your pain increases.   °· You have abdominal pain.   °· You have shortness of breath.   °· You have persistent nausea or vomiting.   °· You have swelling in your abdomen.   °· You feel faint or pass out.   °· You have blood in your urine. °· You have a fever or persistent symptoms for more than 2-3 days. °· You have a fever and your symptoms suddenly get worse. °MAKE SURE YOU:  °· Understand these instructions. °· Will watch your condition. °· Will get help right away if you are not doing well or get worse. °Document Released: 05/21/2005 Document Revised: 12/23/2011 Document Reviewed: 11/12/2011 °ExitCare® Patient Information ©2015 [employer]. This information is not intended to replace advice given to you by your  health care provider. Make sure you discuss any questions you have with your health care provider. ° °

## 2013-10-25 ENCOUNTER — Emergency Department (HOSPITAL_BASED_OUTPATIENT_CLINIC_OR_DEPARTMENT_OTHER)
Admission: EM | Admit: 2013-10-25 | Discharge: 2013-10-25 | Disposition: A | Discharge status: Home / Self Care | Payer: Managed Care, Other (non HMO) | Attending: Emergency Medicine | Admitting: Emergency Medicine

## 2013-10-25 ENCOUNTER — Encounter (HOSPITAL_BASED_OUTPATIENT_CLINIC_OR_DEPARTMENT_OTHER): Payer: Self-pay | Admitting: Emergency Medicine

## 2013-10-25 ENCOUNTER — Emergency Department (HOSPITAL_BASED_OUTPATIENT_CLINIC_OR_DEPARTMENT_OTHER): Payer: Managed Care, Other (non HMO)

## 2013-10-25 DIAGNOSIS — R110 Nausea: Secondary | ICD-10-CM | POA: Insufficient documentation

## 2013-10-25 DIAGNOSIS — R52 Pain, unspecified: Secondary | ICD-10-CM | POA: Insufficient documentation

## 2013-10-25 DIAGNOSIS — F17200 Nicotine dependence, unspecified, uncomplicated: Secondary | ICD-10-CM | POA: Insufficient documentation

## 2013-10-25 DIAGNOSIS — R109 Unspecified abdominal pain: Secondary | ICD-10-CM | POA: Insufficient documentation

## 2013-10-25 DIAGNOSIS — Z79899 Other long term (current) drug therapy: Secondary | ICD-10-CM | POA: Insufficient documentation

## 2013-10-25 DIAGNOSIS — Z87448 Personal history of other diseases of urinary system: Secondary | ICD-10-CM | POA: Insufficient documentation

## 2013-10-25 DIAGNOSIS — Z87442 Personal history of urinary calculi: Secondary | ICD-10-CM | POA: Insufficient documentation

## 2013-10-25 DIAGNOSIS — G8929 Other chronic pain: Secondary | ICD-10-CM | POA: Insufficient documentation

## 2013-10-25 LAB — URINALYSIS, ROUTINE W REFLEX MICROSCOPIC
BILIRUBIN URINE: NEGATIVE
GLUCOSE, UA: NEGATIVE mg/dL
HGB URINE DIPSTICK: NEGATIVE
KETONES UR: NEGATIVE mg/dL
Nitrite: NEGATIVE
PH: 6 (ref 5.0–8.0)
Protein, ur: NEGATIVE mg/dL
SPECIFIC GRAVITY, URINE: 1.024 (ref 1.005–1.030)
Urobilinogen, UA: 0.2 mg/dL (ref 0.0–1.0)

## 2013-10-25 LAB — BASIC METABOLIC PANEL
ANION GAP: 15 (ref 5–15)
BUN: 17 mg/dL (ref 6–23)
CALCIUM: 10.1 mg/dL (ref 8.4–10.5)
CHLORIDE: 99 meq/L (ref 96–112)
CO2: 26 meq/L (ref 19–32)
CREATININE: 1 mg/dL (ref 0.50–1.35)
GFR calc Af Amer: >90 mL/min (ref >90)
GFR calc non Af Amer: >90 mL/min (ref >90)
Glucose, Bld: 93 mg/dL (ref 70–99)
Potassium: 4.3 mEq/L (ref 3.7–5.3)
Sodium: 140 mEq/L (ref 137–147)

## 2013-10-25 LAB — CBC WITH DIFFERENTIAL/PLATELET
Basophils Absolute: 0 10*3/uL (ref 0.0–0.1)
Basophils Relative: 0 % (ref 0–1)
EOS PCT: 3 % (ref 0–5)
Eosinophils Absolute: 0.2 10*3/uL (ref 0.0–0.7)
HEMATOCRIT: 40.9 % (ref 39.0–52.0)
HEMOGLOBIN: 14.2 g/dL (ref 13.0–17.0)
LYMPHS PCT: 31 % (ref 12–46)
Lymphs Abs: 2.5 10*3/uL (ref 0.7–4.0)
MCH: 32.3 pg (ref 26.0–34.0)
MCHC: 34.7 g/dL (ref 30.0–36.0)
MCV: 93 fL (ref 78.0–100.0)
MONO ABS: 0.8 10*3/uL (ref 0.1–1.0)
MONOS PCT: 10 % (ref 3–12)
Neutro Abs: 4.5 10*3/uL (ref 1.7–7.7)
Neutrophils Relative %: 56 % (ref 43–77)
Platelets: 297 10*3/uL (ref 150–400)
RBC: 4.4 MIL/uL (ref 4.22–5.81)
RDW: 12.7 % (ref 11.5–15.5)
WBC: 8 10*3/uL (ref 4.0–10.5)

## 2013-10-25 LAB — URINE MICROSCOPIC-ADD ON

## 2013-10-25 MED ORDER — HYDROMORPHONE HCL PF 1 MG/ML IJ SOLN
0.5000 mg | Freq: Once | INTRAMUSCULAR | Status: AC
  Administered 2013-10-25: 0.5 mg via INTRAVENOUS
  Filled 2013-10-25: qty 1

## 2013-10-25 MED ORDER — HYDROMORPHONE HCL PF 1 MG/ML IJ SOLN
1.0000 mg | Freq: Once | INTRAMUSCULAR | Status: AC
  Administered 2013-10-25: 1 mg via INTRAVENOUS
  Filled 2013-10-25: qty 1

## 2013-10-25 MED ORDER — KETOROLAC TROMETHAMINE 30 MG/ML IJ SOLN
30.0000 mg | Freq: Once | INTRAMUSCULAR | Status: AC
  Administered 2013-10-25: 30 mg via INTRAVENOUS
  Filled 2013-10-25: qty 1

## 2013-10-25 NOTE — ED Provider Notes (Signed)
CSN: [id number]     Arrival date & time 10/25/13  [id number] History   First MD Initiated Contact with Patient 10/25/13 [phone]     Chief Complaint  Patient presents with  . Flank Pain     (Consider location/radiation/quality/duration/timing/severity/associated sxs/prior Treatment) Patient is a 33 y.o. male presenting with flank pain. The history is provided by the patient.  Flank Pain This is a recurrent problem. Pertinent negatives include no chest pain, no abdominal pain, no headaches and no shortness of breath.   patient has pain in his back and bilateral kidney areas. He states he also has pain in his left abdomen. He states he's had over 29 kidney stones. He states the pain got worse last couple days. No dysuria. Some mild nausea without vomiting. He denies constipation. Dr. Gwynneth Alimentoghlan as his urologist. He states it is going to be 24-hour urine collection. He states no relief with his medications. No fevers. The pain is dull and somewhat crampy  Past Medical History  Diagnosis Date  . Renal disorder   . Kidney stone    Past Surgical History  Procedure Laterality Date  . Lithotripsy     No family history on file. History  Substance Use Topics  . Smoking status: Current Every Day Smoker -- 0.50 packs/day for 7 years    Types: Cigarettes  . Smokeless tobacco: Never Used  . Alcohol Use: Yes     Comment: rarely    Review of Systems  Constitutional: Negative for fever, activity change, appetite change and fatigue.  Eyes: Negative for pain.  Respiratory: Negative for chest tightness and shortness of breath.   Cardiovascular: Negative for chest pain and leg swelling.  Gastrointestinal: Positive for nausea. Negative for vomiting, abdominal pain and diarrhea.  Genitourinary: Positive for flank pain.  Musculoskeletal: Negative for back pain and neck stiffness.  Skin: Negative for rash.  Neurological: Negative for numbness and headaches.      Allergies  Review of patient's allergies  indicates no known allergies.  Home Medications   Prior to Admission medications   Medication Sig Start Date End Date Taking? Authorizing Provider  oxyCODONE-acetaminophen (PERCOCET) 5-325 MG per tablet Take 2 tablets by mouth every 4 (four) hours as needed. 10/05/13  Yes Gilda Creasehristopher J. Pollina, MD  tamsulosin (FLOMAX) 0.4 MG CAPS capsule Take 1 capsule (0.4 mg total) by mouth daily. 10/05/13  Yes Gilda Creasehristopher J. Pollina, MD   BP 118/85  Pulse 108  Temp(Src) 98.4 F (36.9 C) (Oral)  Resp 18  Ht 5\' 8"  (1.727 m)  Wt 160 lb (72.576 kg)  BMI 24.33 kg/m2  SpO2 98% Physical Exam  Nursing note and vitals reviewed. Constitutional: He is oriented to person, place, and time. He appears well-developed and well-nourished.  HENT:  Head: Normocephalic and atraumatic.  Eyes: EOM are normal. Pupils are equal, round, and reactive to light.  Neck: Normal range of motion. Neck supple.  Cardiovascular: Normal rate, regular rhythm and normal heart sounds.   No murmur heard. Pulmonary/Chest: Effort normal and breath sounds normal.  Abdominal: Soft. Bowel sounds are normal. He exhibits no distension and no mass. There is tenderness. There is no rebound and no guarding.  Left-sided abdominal tenderness without rebound or guarding  Genitourinary:  Bilateral CVA tenderness.   Musculoskeletal: Normal range of motion. He exhibits no edema.  Neurological: He is alert and oriented to person, place, and time. No cranial nerve deficit.  Skin: Skin is warm and dry.  Psychiatric: He has a normal mood and affect.  ED Course  Procedures (including critical care time) Labs Review Labs Reviewed  CBC WITH DIFFERENTIAL  BASIC METABOLIC PANEL  URINALYSIS, ROUTINE W REFLEX MICROSCOPIC    Imaging Review No results found.   EKG Interpretation None      MDM   Final diagnoses:  None     patient presents with acute on chronic flank and back pain. His been seen in ER for the same. Has known history of  kidney stones, however has no hydronephrosis on ultrasound. Has few white cells and few bacteria on x-ray. Urine culture sent. Patient has had some chronic pain in his kidneys. He's had 11 prescriptions for narcotics from his urologist in the ER in the last 2 months. The prescriptions average 20-25 pills. He has run out of his pills. I will not refill him due to the lack of hydronephrosis and the frequent prescriptions. Will need to follow with his urologist.    Juliet Rude. Rubin Payor, MD 10/25/13 [phone]

## 2013-10-25 NOTE — ED Notes (Signed)
Patient states he has a long history of kidney stones.  Last night he developed bilateral mid back pain with intermittent pain radiating into left flank area.

## 2013-10-25 NOTE — Discharge Instructions (Signed)
Flank Pain °Flank pain refers to pain that is located on the side of the body between the upper abdomen and the back. The pain may occur over a short period of time (acute) or may be long-term or reoccurring (chronic). It may be mild or severe. Flank pain can be caused by many things. °CAUSES  °Some of the more common causes of flank pain include: °· Muscle strains.   °· Muscle spasms.   °· A disease of your spine (vertebral disk disease).   °· A lung infection (pneumonia).   °· Fluid around your lungs (pulmonary edema).   °· A kidney infection.   °· Kidney stones.   °· A very painful skin rash caused by the chickenpox virus (shingles).   °· Gallbladder disease.   °HOME CARE INSTRUCTIONS  °Home care will depend on the cause of your pain. In general, °· Rest as directed by your caregiver. °· Drink enough fluids to keep your urine clear or pale yellow. °· Only take over-the-counter or prescription medicines as directed by your caregiver. Some medicines may help relieve the pain. °· Tell your caregiver about any changes in your pain. °· Follow up with your caregiver as directed. °SEEK IMMEDIATE MEDICAL CARE IF:  °· Your pain is not controlled with medicine.   °· You have new or worsening symptoms. °· Your pain increases.   °· You have abdominal pain.   °· You have shortness of breath.   °· You have persistent nausea or vomiting.   °· You have swelling in your abdomen.   °· You feel faint or pass out.   °· You have blood in your urine. °· You have a fever or persistent symptoms for more than 2-3 days. °· You have a fever and your symptoms suddenly get worse. °MAKE SURE YOU:  °· Understand these instructions. °· Will watch your condition. °· Will get help right away if you are not doing well or get worse. °Document Released: 05/21/2005 Document Revised: 12/23/2011 Document Reviewed: 11/12/2011 °ExitCare® Patient Information ©2015 [employer]. This information is not intended to replace advice given to you by your  health care provider. Make sure you discuss any questions you have with your health care provider. ° °

## 2013-10-25 NOTE — ED Notes (Signed)
Patient called out to state his pain is worse. Update to Dr. Rubin PayorPickering.

## 2013-10-27 LAB — URINE CULTURE
COLONY COUNT: NO GROWTH
Culture: NO GROWTH

## 2013-11-17 ENCOUNTER — Ambulatory Visit: Payer: BC Managed Care – PPO | Admitting: Diagnostic Neuroimaging

## 2013-12-01 ENCOUNTER — Encounter: Payer: Self-pay | Admitting: Diagnostic Neuroimaging

## 2014-02-11 ENCOUNTER — Encounter (HOSPITAL_BASED_OUTPATIENT_CLINIC_OR_DEPARTMENT_OTHER): Payer: Self-pay | Admitting: Emergency Medicine

## 2014-02-11 ENCOUNTER — Emergency Department (HOSPITAL_BASED_OUTPATIENT_CLINIC_OR_DEPARTMENT_OTHER): Payer: Managed Care, Other (non HMO)

## 2014-02-11 ENCOUNTER — Emergency Department (HOSPITAL_BASED_OUTPATIENT_CLINIC_OR_DEPARTMENT_OTHER)
Admission: EM | Admit: 2014-02-11 | Discharge: 2014-02-11 | Disposition: A | Discharge status: Home / Self Care | Payer: Managed Care, Other (non HMO) | Attending: Emergency Medicine | Admitting: Emergency Medicine

## 2014-02-11 DIAGNOSIS — Z79899 Other long term (current) drug therapy: Secondary | ICD-10-CM | POA: Diagnosis not present

## 2014-02-11 DIAGNOSIS — Z720 Tobacco use: Secondary | ICD-10-CM | POA: Diagnosis not present

## 2014-02-11 DIAGNOSIS — N200 Calculus of kidney: Secondary | ICD-10-CM | POA: Diagnosis not present

## 2014-02-11 DIAGNOSIS — R109 Unspecified abdominal pain: Secondary | ICD-10-CM | POA: Diagnosis present

## 2014-02-11 HISTORY — DX: Calculus of kidney: N20.0

## 2014-02-11 LAB — URINALYSIS, ROUTINE W REFLEX MICROSCOPIC
BILIRUBIN URINE: NEGATIVE
Glucose, UA: NEGATIVE mg/dL
Ketones, ur: NEGATIVE mg/dL
Nitrite: NEGATIVE
PH: 7 (ref 5.0–8.0)
Protein, ur: NEGATIVE mg/dL
Specific Gravity, Urine: 1.012 (ref 1.005–1.030)
Urobilinogen, UA: 0.2 mg/dL (ref 0.0–1.0)

## 2014-02-11 LAB — URINE MICROSCOPIC-ADD ON

## 2014-02-11 MED ORDER — ONDANSETRON HCL 4 MG/2ML IJ SOLN
4.0000 mg | Freq: Once | INTRAMUSCULAR | Status: AC
  Administered 2014-02-11: 4 mg via INTRAVENOUS
  Filled 2014-02-11: qty 2

## 2014-02-11 MED ORDER — IBUPROFEN 800 MG PO TABS: 800.0000 mg | ORAL_TABLET | Freq: Three times a day (TID) | ORAL | Status: DC

## 2014-02-11 MED ORDER — HYDROCODONE-ACETAMINOPHEN 5-325 MG PO TABS: 2.0000 | ORAL_TABLET | ORAL | Status: DC | PRN

## 2014-02-11 MED ORDER — KETOROLAC TROMETHAMINE 30 MG/ML IJ SOLN
30.0000 mg | Freq: Once | INTRAMUSCULAR | Status: AC
  Administered 2014-02-11: 30 mg via INTRAVENOUS
  Filled 2014-02-11: qty 1

## 2014-02-11 MED ORDER — ONDANSETRON HCL 4 MG PO TABS: 4.0000 mg | ORAL_TABLET | Freq: Four times a day (QID) | ORAL | Status: DC

## 2014-02-11 MED ORDER — MORPHINE SULFATE 4 MG/ML IJ SOLN
4.0000 mg | Freq: Once | INTRAMUSCULAR | Status: AC
  Administered 2014-02-11: 4 mg via INTRAVENOUS
  Filled 2014-02-11: qty 1

## 2014-02-11 NOTE — ED Notes (Signed)
Pt presents to ED with complaints of bilateral flank pain and blood in urine for 2-3 days.

## 2014-02-11 NOTE — ED Provider Notes (Signed)
CSN: [id number]     Arrival date & time 02/11/14  1116 History   First MD Initiated Contact with Patient 02/11/14 1316     Chief Complaint  Patient presents with  . Flank Pain     (Consider location/radiation/quality/duration/timing/severity/associated sxs/prior Treatment) HPI  The patient has been developing flank pain for about the past 2-3 days. He is noticing pain on both sides but left greater than right. He has noticed blood in his urine. He reports pain is severe and sharp. Patient endorses nausea and vomiting in association with this. The patient has had multiple kidney stones in the past and reports that this feels similar.  Past Medical History  Diagnosis Date  . Renal disorder   . Kidney stone   . Kidney stones    Past Surgical History  Procedure Laterality Date  . Lithotripsy     No family history on file. History  Substance Use Topics  . Smoking status: Current Every Day Smoker -- 0.50 packs/day for 7 years    Types: Cigarettes  . Smokeless tobacco: Never Used  . Alcohol Use: Yes     Comment: rarely    Review of Systems 10 Systems reviewed and are negative for acute change except as noted in the HPI.    Allergies  Review of patient's allergies indicates no known allergies.  Home Medications   Prior to Admission medications   Medication Sig Start Date End Date Taking? Authorizing Provider  HYDROcodone-acetaminophen (NORCO/VICODIN) 5-325 MG per tablet Take 2 tablets by mouth every 4 (four) hours as needed for moderate pain or severe pain. 02/11/14   Arby BarretteMarcy Rasheen Bells, MD  ibuprofen (ADVIL,MOTRIN) 800 MG tablet Take 1 tablet (800 mg total) by mouth 3 (three) times daily. 02/11/14   Arby BarretteMarcy Harlem Bula, MD  ondansetron (ZOFRAN) 4 MG tablet Take 1 tablet (4 mg total) by mouth every 6 (six) hours. 02/11/14   Arby BarretteMarcy Eliane Hammersmith, MD  oxyCODONE-acetaminophen (PERCOCET) 5-325 MG per tablet Take 2 tablets by mouth every 4 (four) hours as needed. 10/05/13   Gilda Creasehristopher J. Pollina, MD   tamsulosin (FLOMAX) 0.4 MG CAPS capsule Take 1 capsule (0.4 mg total) by mouth daily. 10/05/13   Gilda Creasehristopher J. Pollina, MD   BP 111/71 mmHg  Pulse 68  Temp(Src) 97.8 F (36.6 C) (Oral)  Resp 22  Ht 5\' 8"  (1.727 m)  Wt 155 lb (70.308 kg)  BMI 23.57 kg/m2  SpO2 100% Physical Exam  Constitutional: He is oriented to person, place, and time. He appears well-developed and well-nourished.  The patient is rolling around in severe pain. He is nontoxic in appearance. He has no respiratory distress.  HENT:  Head: Normocephalic and atraumatic.  Eyes: EOM are normal. Pupils are equal, round, and reactive to light.  Neck: Neck supple.  Cardiovascular: Normal rate, regular rhythm, normal heart sounds and intact distal pulses.   Pulmonary/Chest: Effort normal and breath sounds normal.  Abdominal: Soft. Bowel sounds are normal. He exhibits no distension. There is no tenderness.  Musculoskeletal: Normal range of motion. He exhibits no edema.  Neurological: He is alert and oriented to person, place, and time. He has normal strength. Coordination normal. GCS eye subscore is 4. GCS verbal subscore is 5. GCS motor subscore is 6.  Skin: Skin is warm, dry and intact.  Psychiatric: He has a normal mood and affect.    ED Course  Procedures (including critical care time) Labs Review Labs Reviewed  URINALYSIS, ROUTINE W REFLEX MICROSCOPIC - Abnormal; Notable for the following:  APPearance CLOUDY (*)    Hgb urine dipstick LARGE (*)    Leukocytes, UA TRACE (*)    All other components within normal limits  URINE MICROSCOPIC-ADD ON - Abnormal; Notable for the following:    Bacteria, UA MANY (*)    All other components within normal limits    Imaging Review Ct Renal Stone Study  02/11/2014   CLINICAL DATA:  Bilateral flank pain with hematuria  EXAM: CT ABDOMEN AND PELVIS WITHOUT CONTRAST  TECHNIQUE: Multidetector CT imaging of the abdomen and pelvis was performed following the standard protocol without  IV contrast.  COMPARISON:  10/30/2013  FINDINGS: The lung bases are free of acute infiltrate or sizable effusion.  The liver, gallbladder, spleen, adrenal glands and pancreas are all normal in their CT appearance. The kidneys are well visualized bilaterally and reveal multiple punctate nonobstructing renal stones. The largest of these measures 1-2 mm. No obstructive changes are noted. The right ureter is within normal limits. The left ureter shows evidence of a 4.5 mm nonobstructing calculus best seen on image number 62 of series 3. The more distal ureter is within normal limits. The bladder is well distended.  The appendix is well visualized. Mild diverticulosis is noted without evidence of diverticulitis. The pelvic structures are within normal limits. No acute bony abnormality is noted.  IMPRESSION: Nonobstructing bilateral renal calculi.  4.5 mm nonobstructing stone in the left mid ureter. This may be the etiology of the patient's underlying hematuria.  No other focal abnormality is seen.   Electronically Signed   By: Alcide CleverMark  Lukens M.D.   On: 02/11/2014 14:22     EKG Interpretation None      MDM   Final diagnoses:  Kidney stone   The patient has history of chronic recurrent kidney stones. Today there is a 4 mm stone in the mid ureter. There is no evidence of acute infection. The patient has a urologist that he sees regularly with whom he will follow-up.    Arby BarretteMarcy Abcde Oneil, MD 02/11/14 [phone]

## 2014-02-11 NOTE — Discharge Instructions (Signed)

## 2014-02-11 NOTE — ED Notes (Signed)
Disc made for patient to take to neurologist tomorrow

## 2014-03-29 ENCOUNTER — Emergency Department (HOSPITAL_BASED_OUTPATIENT_CLINIC_OR_DEPARTMENT_OTHER)
Admission: EM | Admit: 2014-03-29 | Discharge: 2014-03-30 | Disposition: A | Discharge status: Home / Self Care | Payer: Managed Care, Other (non HMO) | Attending: Emergency Medicine | Admitting: Emergency Medicine

## 2014-03-29 ENCOUNTER — Emergency Department (HOSPITAL_BASED_OUTPATIENT_CLINIC_OR_DEPARTMENT_OTHER): Payer: Managed Care, Other (non HMO)

## 2014-03-29 ENCOUNTER — Encounter (HOSPITAL_BASED_OUTPATIENT_CLINIC_OR_DEPARTMENT_OTHER): Payer: Self-pay | Admitting: *Deleted

## 2014-03-29 DIAGNOSIS — R52 Pain, unspecified: Secondary | ICD-10-CM

## 2014-03-29 DIAGNOSIS — Z79899 Other long term (current) drug therapy: Secondary | ICD-10-CM | POA: Insufficient documentation

## 2014-03-29 DIAGNOSIS — R109 Unspecified abdominal pain: Secondary | ICD-10-CM | POA: Diagnosis not present

## 2014-03-29 DIAGNOSIS — Z720 Tobacco use: Secondary | ICD-10-CM | POA: Insufficient documentation

## 2014-03-29 DIAGNOSIS — Z791 Long term (current) use of non-steroidal anti-inflammatories (NSAID): Secondary | ICD-10-CM | POA: Insufficient documentation

## 2014-03-29 DIAGNOSIS — M545 Low back pain: Secondary | ICD-10-CM | POA: Diagnosis present

## 2014-03-29 DIAGNOSIS — Z87448 Personal history of other diseases of urinary system: Secondary | ICD-10-CM | POA: Diagnosis not present

## 2014-03-29 DIAGNOSIS — Z87442 Personal history of urinary calculi: Secondary | ICD-10-CM | POA: Insufficient documentation

## 2014-03-29 LAB — URINALYSIS, ROUTINE W REFLEX MICROSCOPIC
Bilirubin Urine: NEGATIVE
Glucose, UA: NEGATIVE mg/dL
Ketones, ur: NEGATIVE mg/dL
LEUKOCYTES UA: NEGATIVE
Nitrite: NEGATIVE
PH: 6 (ref 5.0–8.0)
Protein, ur: NEGATIVE mg/dL
SPECIFIC GRAVITY, URINE: 1.023 (ref 1.005–1.030)
UROBILINOGEN UA: 0.2 mg/dL (ref 0.0–1.0)

## 2014-03-29 LAB — URINE MICROSCOPIC-ADD ON

## 2014-03-29 MED ORDER — KETOROLAC TROMETHAMINE 60 MG/2ML IM SOLN
60.0000 mg | Freq: Once | INTRAMUSCULAR | Status: AC
  Administered 2014-03-29: 60 mg via INTRAMUSCULAR
  Filled 2014-03-29: qty 2

## 2014-03-29 MED ORDER — METHOCARBAMOL 500 MG PO TABS
1000.0000 mg | ORAL_TABLET | Freq: Once | ORAL | Status: AC
  Administered 2014-03-29: 1000 mg via ORAL
  Filled 2014-03-29: qty 2

## 2014-03-29 MED ORDER — OXYCODONE-ACETAMINOPHEN 5-325 MG PO TABS
2.0000 | ORAL_TABLET | Freq: Once | ORAL | Status: AC
  Administered 2014-03-29: 2 via ORAL
  Filled 2014-03-29: qty 2

## 2014-03-29 NOTE — ED Provider Notes (Signed)
CSN: [id number]     Arrival date & time 03/29/14  2229 History  This chart was scribed for Gregory Flores Smitty CordsK Judie Hollick-Rasch, MD by Tonye RoyaltyJoshua Chen, ED Scribe. This patient was seen in room MH08/MH08 and the patient's care was started at 11:29 PM.    Chief Complaint  Patient presents with  . Back Pain   Patient is a 33 y.o. male presenting with flank pain. The history is provided by the patient. No language interpreter was used.  Flank Pain This is a recurrent problem. The current episode started more than 2 days ago. The problem occurs constantly. The problem has been gradually worsening. Pertinent negatives include no chest pain, no abdominal pain, no headaches and no shortness of breath. Nothing aggravates the symptoms. Nothing relieves the symptoms. Treatments tried: vicodin. The treatment provided no relief.    HPI Comments: Gregory Flores is a 33 y.o. male who presents to the Emergency Department complaining bilateral kidney area pain which has gotten worse in the last two days.  Patient has taken two Vicodin today but it did not help.  Pain has been going on for several days but it got worse 2 days ago.  He has been lifting 50 pound bags but has not done that in a while. [facility] so they referred him here.       Past Medical History  Diagnosis Date  . Renal disorder   . Kidney stone   . Kidney stones    Past Surgical History  Procedure Laterality Date  . Lithotripsy     No family history on file. History  Substance Use Topics  . Smoking status: Current Every Day Smoker -- 0.50 packs/day for 7 years    Types: Cigarettes  . Smokeless tobacco: Never Used  . Alcohol Use: Yes     Comment: rarely    Review of Systems  Respiratory: Negative for shortness of breath.   Cardiovascular: Negative for chest pain.  Gastrointestinal: Negative for abdominal pain.  Genitourinary: Positive for flank pain.  Neurological: Negative for headaches.  All  other systems reviewed and are negative.     Allergies  Review of patient's allergies indicates no known allergies.  Home Medications   Prior to Admission medications   Medication Sig Start Date End Date Taking? Authorizing Provider  HYDROcodone-acetaminophen (NORCO/VICODIN) 5-325 MG per tablet Take 2 tablets by mouth every 4 (four) hours as needed for moderate pain or severe pain. 02/11/14   Arby BarretteMarcy Pfeiffer, MD  ibuprofen (ADVIL,MOTRIN) 800 MG tablet Take 1 tablet (800 mg total) by mouth 3 (three) times daily. 02/11/14   Arby BarretteMarcy Pfeiffer, MD  ondansetron (ZOFRAN) 4 MG tablet Take 1 tablet (4 mg total) by mouth every 6 (six) hours. 02/11/14   Arby BarretteMarcy Pfeiffer, MD  oxyCODONE-acetaminophen (PERCOCET) 5-325 MG per tablet Take 2 tablets by mouth every 4 (four) hours as needed. 10/05/13   Gilda Creasehristopher J. Pollina, MD  tamsulosin (FLOMAX) 0.4 MG CAPS capsule Take 1 capsule (0.4 mg total) by mouth daily. 10/05/13   Gilda Creasehristopher J. Pollina, MD   BP 110/72 mmHg  Pulse 104  Temp(Src) 98.4 F (36.9 C) (Oral)  Resp 20  Ht 5\' 9"  (1.753 m)  Wt 155 lb (70.308 kg)  BMI 22.88 kg/m2  SpO2 97% Physical Exam  Constitutional: He is oriented to person, place, and time. He appears well-developed and well-nourished.  HENT:  Head: Normocephalic and atraumatic.  Moist mucous membranes   Eyes: Conjunctivae  and EOM are normal. Pupils are equal, round, and reactive to light.  Neck: Normal range of motion. Neck supple.  Cardiovascular: Normal rate, regular rhythm and normal heart sounds.   Pulmonary/Chest: Breath sounds normal. No respiratory distress. He has no wheezes. He has no rales.  Abdominal: Soft. Bowel sounds are normal. He exhibits no distension. There is no tenderness. There is no rebound.  Musculoskeletal: Normal range of motion.  No step off, no crepitus to L,T or C spine    Neurological: He is alert and oriented to person, place, and time.  Skin: Skin is warm and dry.  Psychiatric: He has a normal mood  and affect.  Nursing note and vitals reviewed.   ED Course  Procedures (including critical care time)  DIAGNOSTIC STUDIES: Oxygen Saturation is 97% on room ari, normal by my interpretation.    COORDINATION OF CARE: 11:32 PM Discussed treatment plan with patient at beside, the patient agrees with the plan and has no further questions at this time.   Labs Review Labs Reviewed  URINALYSIS, ROUTINE W REFLEX MICROSCOPIC - Abnormal; Notable for the following:    Hgb urine dipstick TRACE (*)    All other components within normal limits  URINE MICROSCOPIC-ADD ON    Imaging Review No results found.   EKG Interpretation None      MDM   Final diagnoses:  None   Patient claimed to have called [facility] today and was instructed to come to this ED. Care Everywhere reviewed called and wanted refill on percocet on 12/1 patient instructed that based on outstanding number of narcotic scripts he could not receive further.  Patient has multiple outstanding RX most recently 03/23/14.  There are no acute stones today.  Will treat with nsaids and muscle relaxers.     I personally performed the services described in this documentation, which was scribed in my presence. The recorded information has been reviewed and is accurate.     Jasmine AweApril K Nedda Gains-Rasch, MD 03/30/14 [phone]

## 2014-03-29 NOTE — ED Notes (Signed)
States he is hurting in both his kidneys. Pain started earlier today.

## 2014-03-30 MED ORDER — MELOXICAM 15 MG PO TABS: 15.0000 mg | ORAL_TABLET | Freq: Every day | ORAL | Status: DC

## 2014-03-30 MED ORDER — METHOCARBAMOL 500 MG PO TABS: 500.0000 mg | ORAL_TABLET | Freq: Two times a day (BID) | ORAL | Status: DC

## 2014-04-27 ENCOUNTER — Emergency Department (HOSPITAL_BASED_OUTPATIENT_CLINIC_OR_DEPARTMENT_OTHER)
Admission: EM | Admit: 2014-04-27 | Discharge: 2014-04-27 | Disposition: A | Discharge status: Home / Self Care | Payer: Managed Care, Other (non HMO) | Attending: Emergency Medicine | Admitting: Emergency Medicine

## 2014-04-27 ENCOUNTER — Encounter (HOSPITAL_BASED_OUTPATIENT_CLINIC_OR_DEPARTMENT_OTHER): Payer: Self-pay | Admitting: *Deleted

## 2014-04-27 DIAGNOSIS — R109 Unspecified abdominal pain: Secondary | ICD-10-CM | POA: Diagnosis not present

## 2014-04-27 DIAGNOSIS — Z79899 Other long term (current) drug therapy: Secondary | ICD-10-CM | POA: Insufficient documentation

## 2014-04-27 DIAGNOSIS — M549 Dorsalgia, unspecified: Secondary | ICD-10-CM | POA: Diagnosis present

## 2014-04-27 DIAGNOSIS — Z9889 Other specified postprocedural states: Secondary | ICD-10-CM | POA: Insufficient documentation

## 2014-04-27 DIAGNOSIS — Z87442 Personal history of urinary calculi: Secondary | ICD-10-CM | POA: Diagnosis not present

## 2014-04-27 DIAGNOSIS — Z791 Long term (current) use of non-steroidal anti-inflammatories (NSAID): Secondary | ICD-10-CM | POA: Insufficient documentation

## 2014-04-27 DIAGNOSIS — Z720 Tobacco use: Secondary | ICD-10-CM | POA: Diagnosis not present

## 2014-04-27 LAB — URINALYSIS, ROUTINE W REFLEX MICROSCOPIC
Bilirubin Urine: NEGATIVE
GLUCOSE, UA: NEGATIVE mg/dL
HGB URINE DIPSTICK: NEGATIVE
KETONES UR: NEGATIVE mg/dL
LEUKOCYTES UA: NEGATIVE
Nitrite: NEGATIVE
PROTEIN: NEGATIVE mg/dL
Specific Gravity, Urine: 1.023 (ref 1.005–1.030)
Urobilinogen, UA: 0.2 mg/dL (ref 0.0–1.0)
pH: 7.5 (ref 5.0–8.0)

## 2014-04-27 MED ORDER — OXYCODONE-ACETAMINOPHEN 5-325 MG PO TABS: 1.0000 | ORAL_TABLET | ORAL | Status: DC | PRN

## 2014-04-27 MED ORDER — ONDANSETRON 4 MG PO TBDP: 4.0000 mg | ORAL_TABLET | Freq: Three times a day (TID) | ORAL | Status: DC | PRN

## 2014-04-27 MED ORDER — KETOROLAC TROMETHAMINE 30 MG/ML IJ SOLN
30.0000 mg | Freq: Once | INTRAMUSCULAR | Status: AC
  Administered 2014-04-27: 30 mg via INTRAVENOUS
  Filled 2014-04-27: qty 1

## 2014-04-27 MED ORDER — ONDANSETRON HCL 4 MG/2ML IJ SOLN
4.0000 mg | Freq: Once | INTRAMUSCULAR | Status: AC
  Administered 2014-04-27: 4 mg via INTRAVENOUS
  Filled 2014-04-27: qty 2

## 2014-04-27 MED ORDER — HYDROMORPHONE HCL 1 MG/ML IJ SOLN
1.0000 mg | Freq: Once | INTRAMUSCULAR | Status: AC
  Administered 2014-04-27: 1 mg via INTRAVENOUS
  Filled 2014-04-27: qty 1

## 2014-04-27 NOTE — ED Provider Notes (Signed)
CSN: [id number]     Arrival date & time 04/27/14  1520 History   First MD Initiated Contact with Patient 04/27/14 1529     Chief Complaint  Patient presents with  . Back Pain     (Consider location/radiation/quality/duration/timing/severity/associated sxs/prior Treatment) The history is provided by the patient and medical records.    This is a 34 y.o. M with PMH significant for kidney stones, presenting to the ED for bilateral flank pain, left worse than right. Patient states is been ongoing for 2 weeks. Is a long-standing history of kidney stones. He endorses some recent dysuria and hematuria but denies difficulty passing urine. Endorses some nausea but denies vomiting. Bowel movements have been normal. Patient is followed by urologist at [facility] u[facility]. He states he called their office this morning, but was encouraged to come here as they did not have any available appointments today.  Past Medical History  Diagnosis Date  . Renal disorder   . Kidney stone   . Kidney stones    Past Surgical History  Procedure Laterality Date  . Lithotripsy     No family history on file. History  Substance Use Topics  . Smoking status: Current Every Day Smoker -- 0.50 packs/day for 7 years    Types: Cigarettes  . Smokeless tobacco: Never Used  . Alcohol Use: Yes     Comment: rarely    Review of Systems  Genitourinary: Positive for dysuria, hematuria and flank pain.  All other systems reviewed and are negative.     Allergies  Review of patient's allergies indicates no known allergies.  Home Medications   Prior to Admission medications   Medication Sig Start Date End Date Taking? Authorizing Provider  HYDROcodone-acetaminophen (NORCO/VICODIN) 5-325 MG per tablet Take 2 tablets by mouth every 4 (four) hours as needed for moderate pain or severe pain. 02/11/14   Arby Barrette, MD  ibuprofen (ADVIL,MOTRIN) 800 MG tablet Take 1 tablet (800 mg total) by mouth 3 (three) times daily. 02/11/14    Arby Barrette, MD  meloxicam (MOBIC) 15 MG tablet Take 1 tablet (15 mg total) by mouth daily. 03/30/14   April K Palumbo-Rasch, MD  methocarbamol (ROBAXIN) 500 MG tablet Take 1 tablet (500 mg total) by mouth 2 (two) times daily. 03/30/14   April K Palumbo-Rasch, MD  ondansetron (ZOFRAN) 4 MG tablet Take 1 tablet (4 mg total) by mouth every 6 (six) hours. 02/11/14   Arby Barrette, MD  oxyCODONE-acetaminophen (PERCOCET) 5-325 MG per tablet Take 2 tablets by mouth every 4 (four) hours as needed. 10/05/13   Gilda Crease, MD  tamsulosin (FLOMAX) 0.4 MG CAPS capsule Take 1 capsule (0.4 mg total) by mouth daily. 10/05/13   Gilda Crease, MD   BP 140/98 mmHg  Pulse 100  Temp(Src) 97.9 F (36.6 C) (Oral)  Resp 20  Ht  (1.727 m)  Wt 148 lb (67.132 kg)  BMI 22.51 kg/m2  SpO2 100%   Physical Exam  Constitutional: He is oriented to person, place, and time. He appears well-developed and well-nourished.  HENT:  Head: Normocephalic and atraumatic.  Mouth/Throat: Oropharynx is clear and moist.  Eyes: Conjunctivae and EOM are normal. Pupils are equal, round, and reactive to light.  Neck: Normal range of motion.  Cardiovascular: Normal rate, regular rhythm and normal heart sounds.   Pulmonary/Chest: Effort normal and breath sounds normal. No respiratory distress. He has no wheezes.  Abdominal: Soft. Bowel sounds are normal. There is no tenderness. There is CVA tenderness. There  is no guarding.  CVA tenderness; L > R  Musculoskeletal: Normal range of motion.  Neurological: He is alert and oriented to person, place, and time.  Skin: Skin is warm and dry.  Psychiatric: He has a normal mood and affect.  Nursing note and vitals reviewed.   ED Course  Procedures (including critical care time) Labs Review Labs Reviewed  URINALYSIS, ROUTINE W REFLEX MICROSCOPIC - Abnormal; Notable for the following:    APPearance CLOUDY (*)    All other components within normal limits     Imaging Review No results found.   EKG Interpretation None      MDM   Final diagnoses:  Flank pain   34 y.o. M with long hx of kidney stones here with bilateral flank pain, L >R.  Patient afebrile, non-toxic, in NAD.  Left CVA tenderness noted on exam.  U/a negative, no hematuria to suggest mobile stone.  No difficulty urinating, no infection. Patient treated in the ED with toradol and dose of dilaudid.  Patient stable for discharge.  He will FU with this urologist.  Discussed plan with patient, he/she acknowledged understanding and agreed with plan of care.  Return precautions given for new or worsening symptoms.  Garlon HatchetLisa M Loryn Haacke, PA-C 04/27/14 1935  Toy CookeyMegan Docherty, MD 04/27/14 2350

## 2014-04-27 NOTE — ED Notes (Signed)
Back pain x 2 weeks. Worse in his left flank.

## 2014-04-27 NOTE — Discharge Instructions (Signed)
Take the prescribed medication as directed. °Follow-up with your urologist. °Return to the ED for new or worsening symptoms. ° °

## 2014-05-15 ENCOUNTER — Encounter (HOSPITAL_BASED_OUTPATIENT_CLINIC_OR_DEPARTMENT_OTHER): Payer: Self-pay | Admitting: *Deleted

## 2014-05-15 ENCOUNTER — Emergency Department (HOSPITAL_BASED_OUTPATIENT_CLINIC_OR_DEPARTMENT_OTHER)
Admission: EM | Admit: 2014-05-15 | Discharge: 2014-05-15 | Disposition: A | Discharge status: Home / Self Care | Payer: Managed Care, Other (non HMO) | Attending: Emergency Medicine | Admitting: Emergency Medicine

## 2014-05-15 DIAGNOSIS — Z720 Tobacco use: Secondary | ICD-10-CM | POA: Insufficient documentation

## 2014-05-15 DIAGNOSIS — Z791 Long term (current) use of non-steroidal anti-inflammatories (NSAID): Secondary | ICD-10-CM | POA: Insufficient documentation

## 2014-05-15 DIAGNOSIS — R000 Tachycardia, unspecified: Secondary | ICD-10-CM | POA: Diagnosis not present

## 2014-05-15 DIAGNOSIS — Z87442 Personal history of urinary calculi: Secondary | ICD-10-CM | POA: Insufficient documentation

## 2014-05-15 DIAGNOSIS — Z79899 Other long term (current) drug therapy: Secondary | ICD-10-CM | POA: Diagnosis not present

## 2014-05-15 DIAGNOSIS — Z87448 Personal history of other diseases of urinary system: Secondary | ICD-10-CM | POA: Diagnosis not present

## 2014-05-15 DIAGNOSIS — G8929 Other chronic pain: Secondary | ICD-10-CM | POA: Diagnosis not present

## 2014-05-15 DIAGNOSIS — R109 Unspecified abdominal pain: Secondary | ICD-10-CM | POA: Diagnosis not present

## 2014-05-15 DIAGNOSIS — M549 Dorsalgia, unspecified: Secondary | ICD-10-CM | POA: Diagnosis present

## 2014-05-15 LAB — CBC WITH DIFFERENTIAL/PLATELET
Basophils Absolute: 0 10*3/uL (ref 0.0–0.1)
Basophils Relative: 0 % (ref 0–1)
EOS ABS: 0 10*3/uL (ref 0.0–0.7)
Eosinophils Relative: 1 % (ref 0–5)
HCT: 43.7 % (ref 39.0–52.0)
Hemoglobin: 15.1 g/dL (ref 13.0–17.0)
Lymphocytes Relative: 25 % (ref 12–46)
Lymphs Abs: 1.7 10*3/uL (ref 0.7–4.0)
MCH: 31.6 pg (ref 26.0–34.0)
MCHC: 34.6 g/dL (ref 30.0–36.0)
MCV: 91.4 fL (ref 78.0–100.0)
MONO ABS: 0.5 10*3/uL (ref 0.1–1.0)
Monocytes Relative: 7 % (ref 3–12)
NEUTROS ABS: 4.4 10*3/uL (ref 1.7–7.7)
Neutrophils Relative %: 67 % (ref 43–77)
Platelets: 309 10*3/uL (ref 150–400)
RBC: 4.78 MIL/uL (ref 4.22–5.81)
RDW: 12.3 % (ref 11.5–15.5)
WBC: 6.6 10*3/uL (ref 4.0–10.5)

## 2014-05-15 LAB — BASIC METABOLIC PANEL
ANION GAP: 7 (ref 5–15)
BUN: 18 mg/dL (ref 6–23)
CO2: 24 mmol/L (ref 19–32)
Calcium: 9.1 mg/dL (ref 8.4–10.5)
Chloride: 106 mmol/L (ref 96–112)
Creatinine, Ser: 0.94 mg/dL (ref 0.50–1.35)
GFR calc Af Amer: >90 mL/min (ref >90)
GFR calc non Af Amer: >90 mL/min (ref >90)
Glucose, Bld: 101 mg/dL — ABNORMAL HIGH (ref 70–99)
Potassium: 4 mmol/L (ref 3.5–5.1)
Sodium: 137 mmol/L (ref 135–145)

## 2014-05-15 LAB — URINALYSIS, ROUTINE W REFLEX MICROSCOPIC
GLUCOSE, UA: NEGATIVE mg/dL
Hgb urine dipstick: NEGATIVE
Ketones, ur: 15 mg/dL — AB
Leukocytes, UA: NEGATIVE
NITRITE: NEGATIVE
PH: 7 (ref 5.0–8.0)
Protein, ur: NEGATIVE mg/dL
SPECIFIC GRAVITY, URINE: 1.022 (ref 1.005–1.030)
UROBILINOGEN UA: 1 mg/dL (ref 0.0–1.0)

## 2014-05-15 MED ORDER — KETOROLAC TROMETHAMINE 30 MG/ML IJ SOLN
30.0000 mg | Freq: Once | INTRAMUSCULAR | Status: AC
  Administered 2014-05-15: 30 mg via INTRAVENOUS
  Filled 2014-05-15: qty 1

## 2014-05-15 MED ORDER — HYDROMORPHONE HCL 1 MG/ML IJ SOLN
1.0000 mg | Freq: Once | INTRAMUSCULAR | Status: AC
  Administered 2014-05-15: 1 mg via INTRAVENOUS
  Filled 2014-05-15: qty 1

## 2014-05-15 MED ORDER — SODIUM CHLORIDE 0.9 % IV BOLUS (SEPSIS)
1000.0000 mL | Freq: Once | INTRAVENOUS | Status: AC
  Administered 2014-05-15: 1000 mL via INTRAVENOUS

## 2014-05-15 NOTE — Discharge Instructions (Signed)
Please be sure to follow up with your primary providers regarding your pain.   Flank Pain Flank pain refers to pain that is located on the side of the body between the upper abdomen and the back. The pain may occur over a short period of time (acute) or may be long-term or reoccurring (chronic). It may be mild or severe. Flank pain can be caused by many things. CAUSES  Some of the more common causes of flank pain include:  Muscle strains.   Muscle spasms.   A disease of your spine (vertebral disk disease).   A lung infection (pneumonia).   Fluid around your lungs (pulmonary edema).   A kidney infection.   Kidney stones.   A very painful skin rash caused by the chickenpox virus (shingles).   Gallbladder disease.  HOME CARE INSTRUCTIONS  Home care will depend on the cause of your pain. In general,  Rest as directed by your caregiver.  Drink enough fluids to keep your urine clear or pale yellow.  Only take over-the-counter or prescription medicines as directed by your caregiver. Some medicines may help relieve the pain.  Tell your caregiver about any changes in your pain.  Follow up with your caregiver as directed. SEEK IMMEDIATE MEDICAL CARE IF:   Your pain is not controlled with medicine.   You have new or worsening symptoms.  Your pain increases.   You have abdominal pain.   You have shortness of breath.   You have persistent nausea or vomiting.   You have swelling in your abdomen.   You feel faint or pass out.   You have blood in your urine.  You have a fever or persistent symptoms for more than 2-3 days.  You have a fever and your symptoms suddenly get worse. MAKE SURE YOU:   Understand these instructions.  Will watch your condition.  Will get help right away if you are not doing well or get worse. Document Released: 05/21/2005 Document Revised: 12/23/2011 Document Reviewed: 11/12/2011 [facility] Patient Information 2015 [employer]  [place]. This information is not intended to replace advice given to you by your health care provider. Make sure you discuss any questions you have with your health care provider.

## 2014-05-15 NOTE — ED Provider Notes (Signed)
CSN: [id number]     Arrival date & time 05/15/14  1505 History   First MD Initiated Contact with Patient 05/15/14 1524     Chief Complaint  Patient presents with  . Back Pain    Patient is a 34 y.o. male presenting with back pain. The history is provided by the patient. No language interpreter was used.  Back Pain  Gregory Flores presents for evaluation of left flank pain. He states his pain started at 5 AM this morning. It is described as sharp and constant in nature. He denies any alleviating or worsening factors. The pain is nonradiating. He reports hematuria and some mild dysuria. Denies any fever, vomiting, abdominal pain. He's had multiple prior similar episodes and was diagnosed with kidney stones. He states he has had multiple lithotripsies previously. He is currently in between urologists. His next urology appointment scheduled for February 8. Symptoms are moderate, constant, worsening.  Past Medical History  Diagnosis Date  . Renal disorder   . Kidney stone   . Kidney stones    Past Surgical History  Procedure Laterality Date  . Lithotripsy     No family history on file. History  Substance Use Topics  . Smoking status: Current Every Day Smoker -- 0.50 packs/day for 7 years    Types: Cigarettes  . Smokeless tobacco: Never Used  . Alcohol Use: Yes     Comment: rarely    Review of Systems  Musculoskeletal: Positive for back pain.  All other systems reviewed and are negative.     Allergies  Review of patient's allergies indicates no known allergies.  Home Medications   Prior to Admission medications   Medication Sig Start Date End Date Taking? Authorizing Provider  HYDROcodone-acetaminophen (NORCO/VICODIN) 5-325 MG per tablet Take 2 tablets by mouth every 4 (four) hours as needed for moderate pain or severe pain. 02/11/14   Arby Barrette, MD  ibuprofen (ADVIL,MOTRIN) 800 MG tablet Take 1 tablet (800 mg total) by mouth 3 (three) times daily. 02/11/14   Arby Barrette,  MD  meloxicam (MOBIC) 15 MG tablet Take 1 tablet (15 mg total) by mouth daily. 03/30/14   April K Palumbo-Rasch, MD  methocarbamol (ROBAXIN) 500 MG tablet Take 1 tablet (500 mg total) by mouth 2 (two) times daily. 03/30/14   April K Palumbo-Rasch, MD  ondansetron (ZOFRAN ODT) 4 MG disintegrating tablet Take 1 tablet (4 mg total) by mouth every 8 (eight) hours as needed for nausea. 04/27/14   Garlon Hatchet, PA-C  ondansetron (ZOFRAN) 4 MG tablet Take 1 tablet (4 mg total) by mouth every 6 (six) hours. 02/11/14   Arby Barrette, MD  oxyCODONE-acetaminophen (PERCOCET/ROXICET) 5-325 MG per tablet Take 1 tablet by mouth every 4 (four) hours as needed. 04/27/14   Garlon Hatchet, PA-C  tamsulosin (FLOMAX) 0.4 MG CAPS capsule Take 1 capsule (0.4 mg total) by mouth daily. 10/05/13   Gilda Crease, MD   BP 143/113 mmHg  Pulse 124  Temp(Src) 97.7 F (36.5 C) (Oral)  Resp 20  Ht  (1.727 m)  Wt 145 lb (65.772 kg)  BMI 22.05 kg/m2  SpO2 100% Physical Exam  Constitutional: He is oriented to person, place, and time. He appears well-developed and well-nourished.  HENT:  Head: Normocephalic and atraumatic.  Cardiovascular: Regular rhythm.   No murmur heard. Tachycardic  Pulmonary/Chest: Effort normal and breath sounds normal. No respiratory distress.  Abdominal: Soft. There is no tenderness. There is no rebound and no guarding.  CVA tenderness  on the left  Musculoskeletal: He exhibits no edema or tenderness.  Neurological: He is alert and oriented to person, place, and time.  Skin: Skin is warm and dry.  Psychiatric: He has a normal mood and affect. His behavior is normal.  Nursing note and vitals reviewed.   ED Course  Procedures (including critical care time) Labs Review Labs Reviewed  URINALYSIS, ROUTINE W REFLEX MICROSCOPIC - Abnormal; Notable for the following:    Color, Urine AMBER (*)    Bilirubin Urine SMALL (*)    Ketones, ur 15 (*)    All other components within normal  limits  BASIC METABOLIC PANEL - Abnormal; Notable for the following:    Glucose, Bld 101 (*)    All other components within normal limits  CBC WITH DIFFERENTIAL/PLATELET    Imaging Review No results found.   EKG Interpretation None      MDM   Final diagnoses:  Flank pain    Patient with history of chronic low back pain and renal colic here for evaluation of left flank pain. Patient with multiple visits to multiple different providers when evaluated in care everywhere. [facility] and [facility]. He's had multiple CT scans, 2 in December as well as [facility]. [facility]. Discuss edwith patient concern for multiple visits to multiple providers for his chronic pain condition. Discussed with patient that we cannot provide him any prescriptions for narcotic pain medications and recommended very close follow-up for further evaluation of his acute on chronic pain. There is no evidence of obstructing stone based on today's evaluation and recent ultrasound on January 26 without any evidence of left-sided renal stones. There is no evidence of acute bacterial infection or major electrolyte abnormalities.    Tilden FossaElizabeth Rishik Tubby, MD 05/15/14 [phone]

## 2014-05-15 NOTE — ED Notes (Signed)
Back pain. Hx of kidney stones and states this pain feels like kidney pain.

## 2014-06-26 ENCOUNTER — Emergency Department (HOSPITAL_BASED_OUTPATIENT_CLINIC_OR_DEPARTMENT_OTHER): Payer: Managed Care, Other (non HMO)

## 2014-06-26 ENCOUNTER — Encounter (HOSPITAL_BASED_OUTPATIENT_CLINIC_OR_DEPARTMENT_OTHER): Payer: Self-pay | Admitting: *Deleted

## 2014-06-26 ENCOUNTER — Emergency Department (HOSPITAL_BASED_OUTPATIENT_CLINIC_OR_DEPARTMENT_OTHER)
Admission: EM | Admit: 2014-06-26 | Discharge: 2014-06-26 | Disposition: A | Discharge status: Home / Self Care | Payer: Managed Care, Other (non HMO) | Attending: Emergency Medicine | Admitting: Emergency Medicine

## 2014-06-26 DIAGNOSIS — R350 Frequency of micturition: Secondary | ICD-10-CM | POA: Diagnosis not present

## 2014-06-26 DIAGNOSIS — Z79899 Other long term (current) drug therapy: Secondary | ICD-10-CM | POA: Insufficient documentation

## 2014-06-26 DIAGNOSIS — M1990 Unspecified osteoarthritis, unspecified site: Secondary | ICD-10-CM | POA: Insufficient documentation

## 2014-06-26 DIAGNOSIS — M549 Dorsalgia, unspecified: Secondary | ICD-10-CM | POA: Diagnosis present

## 2014-06-26 DIAGNOSIS — Z87448 Personal history of other diseases of urinary system: Secondary | ICD-10-CM | POA: Diagnosis not present

## 2014-06-26 DIAGNOSIS — G8929 Other chronic pain: Secondary | ICD-10-CM | POA: Insufficient documentation

## 2014-06-26 DIAGNOSIS — R319 Hematuria, unspecified: Secondary | ICD-10-CM | POA: Diagnosis not present

## 2014-06-26 DIAGNOSIS — M545 Low back pain: Secondary | ICD-10-CM | POA: Insufficient documentation

## 2014-06-26 DIAGNOSIS — Z791 Long term (current) use of non-steroidal anti-inflammatories (NSAID): Secondary | ICD-10-CM | POA: Insufficient documentation

## 2014-06-26 DIAGNOSIS — Z87442 Personal history of urinary calculi: Secondary | ICD-10-CM | POA: Diagnosis not present

## 2014-06-26 HISTORY — DX: Unspecified osteoarthritis, unspecified site: M19.90

## 2014-06-26 HISTORY — DX: Other intervertebral disc degeneration, lumbar region: M51.36

## 2014-06-26 HISTORY — DX: Other intervertebral disc displacement, lumbar region: M51.26

## 2014-06-26 HISTORY — DX: Other intervertebral disc degeneration, lumbar region without mention of lumbar back pain or lower extremity pain: M51.369

## 2014-06-26 LAB — URINALYSIS, ROUTINE W REFLEX MICROSCOPIC
Bilirubin Urine: NEGATIVE
Glucose, UA: NEGATIVE mg/dL
Hgb urine dipstick: NEGATIVE
Ketones, ur: NEGATIVE mg/dL
Leukocytes, UA: NEGATIVE
NITRITE: NEGATIVE
PROTEIN: NEGATIVE mg/dL
Specific Gravity, Urine: 1.023 (ref 1.005–1.030)
UROBILINOGEN UA: 1 mg/dL (ref 0.0–1.0)
pH: 7 (ref 5.0–8.0)

## 2014-06-26 LAB — CBC
HCT: 43.7 % (ref 39.0–52.0)
Hemoglobin: 15 g/dL (ref 13.0–17.0)
MCH: 31.7 pg (ref 26.0–34.0)
MCHC: 34.3 g/dL (ref 30.0–36.0)
MCV: 92.4 fL (ref 78.0–100.0)
PLATELETS: 286 10*3/uL (ref 150–400)
RBC: 4.73 MIL/uL (ref 4.22–5.81)
RDW: 12.5 % (ref 11.5–15.5)
WBC: 10.3 10*3/uL (ref 4.0–10.5)

## 2014-06-26 LAB — BASIC METABOLIC PANEL
Anion gap: 10 (ref 5–15)
BUN: 15 mg/dL (ref 6–23)
CALCIUM: 9 mg/dL (ref 8.4–10.5)
CO2: 27 mmol/L (ref 19–32)
CREATININE: 1.16 mg/dL (ref 0.50–1.35)
Chloride: 105 mmol/L (ref 96–112)
GFR calc Af Amer: >90 mL/min (ref >90)
GFR calc non Af Amer: 83 mL/min — ABNORMAL LOW (ref >90)
GLUCOSE: 97 mg/dL (ref 70–99)
Potassium: 4.1 mmol/L (ref 3.5–5.1)
Sodium: 142 mmol/L (ref 135–145)

## 2014-06-26 LAB — URINE MICROSCOPIC-ADD ON

## 2014-06-26 MED ORDER — KETOROLAC TROMETHAMINE 30 MG/ML IJ SOLN
30.0000 mg | Freq: Once | INTRAMUSCULAR | Status: AC
  Administered 2014-06-26: 30 mg via INTRAVENOUS
  Filled 2014-06-26: qty 1

## 2014-06-26 MED ORDER — OXYCODONE-ACETAMINOPHEN 5-325 MG PO TABS: 1.0000 | ORAL_TABLET | Freq: Four times a day (QID) | ORAL | Status: DC | PRN

## 2014-06-26 MED ORDER — OXYCODONE-ACETAMINOPHEN 5-325 MG PO TABS
2.0000 | ORAL_TABLET | Freq: Once | ORAL | Status: AC
  Administered 2014-06-26: 2 via ORAL
  Filled 2014-06-26: qty 2

## 2014-06-26 MED ORDER — SODIUM CHLORIDE 0.9 % IV BOLUS (SEPSIS)
1000.0000 mL | Freq: Once | INTRAVENOUS | Status: AC
  Administered 2014-06-26: 1000 mL via INTRAVENOUS

## 2014-06-26 MED ORDER — HYDROMORPHONE HCL 1 MG/ML IJ SOLN
1.0000 mg | Freq: Once | INTRAMUSCULAR | Status: AC
  Administered 2014-06-26: 1 mg via INTRAVENOUS
  Filled 2014-06-26: qty 1

## 2014-06-26 NOTE — Discharge Instructions (Signed)
Back Pain, Adult Low back pain is very common. About 1 in 5 people have back pain.The cause of low back pain is rarely dangerous. The pain often gets better over time.About half of people with a sudden onset of back pain feel better in just 2 weeks. About 8 in 10 people feel better by 6 weeks.  CAUSES Some common causes of back pain include:  Strain of the muscles or ligaments supporting the spine.  Wear and tear (degeneration) of the spinal discs.  Arthritis.  Direct injury to the back. DIAGNOSIS Most of the time, the direct cause of low back pain is not known.However, back pain can be treated effectively even when the exact cause of the pain is unknown.Answering your caregiver's questions about your overall health and symptoms is one of the most accurate ways to make sure the cause of your pain is not dangerous. If your caregiver needs more information, he or she may order lab work or imaging tests (X-rays or MRIs).However, even if imaging tests show changes in your back, this usually does not require surgery. HOME CARE INSTRUCTIONS For many people, back pain returns.Since low back pain is rarely dangerous, it is often a condition that people can learn to manageon their own.   Remain active. It is stressful on the back to sit or stand in one place. Do not sit, drive, or stand in one place for more than 30 minutes at a time. Take short walks on level surfaces as soon as pain allows.Try to increase the length of time you walk each day.  Do not stay in bed.Resting more than 1 or 2 days can delay your recovery.  Do not avoid exercise or work.Your body is made to move.It is not dangerous to be active, even though your back may hurt.Your back will likely heal faster if you return to being active before your pain is gone.  Pay attention to your body when you bend and lift. Many people have less discomfortwhen lifting if they bend their knees, keep the load close to their bodies,and  avoid twisting. Often, the most comfortable positions are those that put less stress on your recovering back.  Find a comfortable position to sleep. Use a firm mattress and lie on your side with your knees slightly bent. If you lie on your back, put a pillow under your knees.  Only take over-the-counter or prescription medicines as directed by your caregiver. Over-the-counter medicines to reduce pain and inflammation are often the most helpful.Your caregiver may prescribe muscle relaxant drugs.These medicines help dull your pain so you can more quickly return to your normal activities and healthy exercise.  Put ice on the injured area.  Put ice in a plastic bag.  Place a towel between your skin and the bag.  Leave the ice on for 15-20 minutes, 03-04 times a day for the first 2 to 3 days. After that, ice and heat may be alternated to reduce pain and spasms.  Ask your caregiver about trying back exercises and gentle massage. This may be of some benefit.  Avoid feeling anxious or stressed.Stress increases muscle tension and can worsen back pain.It is important to recognize when you are anxious or stressed and learn ways to manage it.Exercise is a great option. SEEK MEDICAL CARE IF:  You have pain that is not relieved with rest or medicine.  You have pain that does not improve in 1 week.  You have new symptoms.  You are generally not feeling well. SEEK   IMMEDIATE MEDICAL CARE IF:   You have pain that radiates from your back into your legs.  You develop new bowel or bladder control problems.  You have unusual weakness or numbness in your arms or legs.  You develop nausea or vomiting.  You develop abdominal pain.  You feel faint. Document Released: 03/30/2005 Document Revised: 09/29/2011 Document Reviewed: 08/01/2013 Exit[employer] Patient Information 2015 [employer]. This information is not intended to replace advice given to you by your health care provider. Make sure you  discuss any questions you have with your health care provider.  

## 2014-06-26 NOTE — ED Notes (Signed)
Back pain with radiation down his left leg last night. Hematuria this am.

## 2014-06-26 NOTE — ED Provider Notes (Signed)
CSN: [id number]     Arrival date & time 06/26/14  1845 History  This chart was scribed for Gregory Mocha, MD by Gwenyth Ober, ED Scribe. This patient was seen in room MH07/MH07 and the patient's care was started at 7:08 PM.    Chief Complaint  Patient presents with  . Back Pain  . Hematuria   Patient is a 34 y.o. male presenting with back pain and hematuria. The history is provided by the patient. No language interpreter was used.  Back Pain Location:  Generalized Quality:  Unable to specify Radiates to:  L posterior upper leg Pain severity:  Moderate Pain is:  Unable to specify Onset quality:  Gradual Timing:  Constant Chronicity:  Chronic Context: not falling and not recent injury   Relieved by:  Nothing Worsened by:  Nothing tried Ineffective treatments:  Lying down and being still Associated symptoms: no abdominal pain, no bladder incontinence, no bowel incontinence, no chest pain, no fever and no headaches   Hematuria This is a new problem. The current episode started 12 to 24 hours ago. The problem has not changed since onset.Pertinent negatives include no chest pain, no abdominal pain, no headaches and no shortness of breath. Nothing aggravates the symptoms. Nothing relieves the symptoms. He has tried nothing for the symptoms.    HPI Comments: Gregory Flores is a 34 y.o. male who presents to the Emergency Department complaining of an acute-on-chronic episode of constant, moderate back pain that radiates to his left leg and became worse today. He states increased frequency, nausea and hematuria as associated symptoms. He reports pain becomes worse with ambulation. Pt has chronic back pain and was recently diagnosed with a bulging disc, arthritis and a lesion on his kidney. He reports that he has seen pain management, but has not been able to schedule a follow-up appointment.  Pt denies recent injuries or falls. He also denies bladder or bowel incontinence, vomiting and fever as  associated symptoms  Past Medical History  Diagnosis Date  . Renal disorder   . Kidney stone   . Kidney stones   . Bulging lumbar disc   . Arthritis    Past Surgical History  Procedure Laterality Date  . Lithotripsy     No family history on file. History  Substance Use Topics  . Smoking status: Current Every Day Smoker -- 0.50 packs/day for 7 years    Types: Cigarettes  . Smokeless tobacco: Never Used  . Alcohol Use: Yes     Comment: rarely    Review of Systems  Constitutional: Negative for fever.  Respiratory: Negative for shortness of breath.   Cardiovascular: Negative for chest pain.  Gastrointestinal: Positive for nausea. Negative for vomiting, abdominal pain and bowel incontinence.  Genitourinary: Positive for frequency and hematuria. Negative for bladder incontinence.  Musculoskeletal: Positive for back pain.  Neurological: Negative for headaches.  All other systems reviewed and are negative.     Allergies  Review of patient's allergies indicates no known allergies.  Home Medications   Prior to Admission medications   Medication Sig Start Date End Date Taking? Authorizing Provider  HYDROcodone-acetaminophen (NORCO/VICODIN) 5-325 MG per tablet Take 2 tablets by mouth every 4 (four) hours as needed for moderate pain or severe pain. 02/11/14   Arby Barrette, MD  ibuprofen (ADVIL,MOTRIN) 800 MG tablet Take 1 tablet (800 mg total) by mouth 3 (three) times daily. 02/11/14   Arby Barrette, MD  meloxicam (MOBIC) 15 MG tablet Take 1 tablet (15 mg total) by  mouth daily. 03/30/14   April Palumbo, MD  methocarbamol (ROBAXIN) 500 MG tablet Take 1 tablet (500 mg total) by mouth 2 (two) times daily. 03/30/14   April Palumbo, MD  ondansetron (ZOFRAN ODT) 4 MG disintegrating tablet Take 1 tablet (4 mg total) by mouth every 8 (eight) hours as needed for nausea. 04/27/14   Garlon Hatchet, PA-C  ondansetron (ZOFRAN) 4 MG tablet Take 1 tablet (4 mg total) by mouth every 6 (six)  hours. 02/11/14   Arby Barrette, MD  oxyCODONE-acetaminophen (PERCOCET/ROXICET) 5-325 MG per tablet Take 1 tablet by mouth every 4 (four) hours as needed. 04/27/14   Garlon Hatchet, PA-C  tamsulosin (FLOMAX) 0.4 MG CAPS capsule Take 1 capsule (0.4 mg total) by mouth daily. 10/05/13   Gilda Crease, MD   BP 152/69 mmHg  Pulse 120  Temp(Src) 98.2 F (36.8 C) (Oral)  Resp 20  Ht  (1.753 m)  Wt 150 lb (68.04 kg)  BMI 22.14 kg/m2  SpO2 100% Physical Exam  Constitutional: He is oriented to person, place, and time. He appears well-developed and well-nourished. No distress.  HENT:  Head: Normocephalic and atraumatic.  Mouth/Throat: Oropharynx is clear and moist. No oropharyngeal exudate.  Eyes: Pupils are equal, round, and reactive to light.  Neck: Normal range of motion. Neck supple.  Cardiovascular: Normal rate, regular rhythm and normal heart sounds.  Exam reveals no gallop and no friction rub.   No murmur heard. Pulmonary/Chest: Effort normal and breath sounds normal. No respiratory distress. He has no wheezes. He has no rales.  Abdominal: Soft. Bowel sounds are normal. He exhibits no distension and no mass. There is no tenderness. There is no rebound and no guarding.  Musculoskeletal: Normal range of motion. He exhibits no edema or tenderness.       Arms: Neurological: He is alert and oriented to person, place, and time. No cranial nerve deficit. He exhibits normal muscle tone. Coordination normal. GCS eye subscore is 4. GCS verbal subscore is 5. GCS motor subscore is 6.  Reflex Scores:      Patellar reflexes are 2+ on the right side and 2+ on the left side. Skin: Skin is warm and dry. No rash noted.  Psychiatric: He has a normal mood and affect.  Nursing note and vitals reviewed.   ED Course  Procedures (including critical care time) DIAGNOSTIC STUDIES: Oxygen Saturation is 100% on RA, normal by my interpretation.    COORDINATION OF CARE: 7:12 PM Discussed treatment  plan with pt at bedside and pt agreed to plan.    Labs Review Labs Reviewed  URINALYSIS, ROUTINE W REFLEX MICROSCOPIC    Imaging Review Ct Renal Stone Study  06/26/2014   CLINICAL DATA:  Bilateral lower back pain. History of kidney stones and lithotripsy. Renal disorder.  EXAM: CT ABDOMEN AND PELVIS WITHOUT CONTRAST  TECHNIQUE: Multidetector CT imaging of the abdomen and pelvis was performed following the standard protocol without IV contrast.  COMPARISON:  05/26/2014  FINDINGS: Lower chest: Lung bases are unremarkable.  Upper abdomen: Small bilateral intrarenal calculi are identified, not associated with obstruction. The largest is identified in the lower pole region on the right, measuring 3 mm in diameter. There is no hydronephrosis. The ureters are unremarkable bilaterally.  No focal abnormality identified within the liver, spleen, pancreas, or adrenal glands. The gallbladder is present.  Gastrointestinal tract: The stomach is distended with ingested food. Small bowel loops are normal in appearance. Colonic loops are normal in appearance. The appendix  is well seen and has a normal appearance.  Pelvis: Urinary bladder, seminal vesicles, and prostate gland have a normal appearance. No free pelvic fluid.  Retroperitoneum: No retroperitoneal or mesenteric adenopathy.  Abdominal wall: Unremarkable.  Osseous structures: Visualized osseous structures have a normal appearance.  IMPRESSION: 1. Bilateral nonobstructing intrarenal calculi, measuring 1-3 mm in diameter. 2. No ureteral stones. 3. Normal appendix.   Electronically Signed   By: Norva PavlovElizabeth  Brown M.D.   On: 06/26/2014 20:05     EKG Interpretation None      MDM   Final diagnoses:  Back pain    34 year old male here with back pain. History of chronic back pain, is followed by pain management. His's planning on spinal surgery in a couple months. Is also evidence of kidney pain consistent with prior kidney stones. He's had numerous kidney  stones in his lifetime. His back pain is writing down his left leg. Denies any urinary or fecal incontinence or retention. No fevers. On exam vitals shows tachycardia, likely cause of his pain. He has normal reflexes, strength, sensation in all lower extremities. Belly is benign. He has a lot of muscular back pain in the left and right lower lumbar region. We'll plan for CT to look for possible kidney stone. This also look at his spine to ensure no acute fractures. Do not have MRI available, he is neurovascular intact and has no red flags, do not think he needs emergent MRI at this time.   CT negative for stone. Will give small amount of narcotics until he can get into his Chronic Pain Management specialist's office this week.  Not currently on any long-term narcotics.   I personally performed the services described in this documentation, which was scribed in my presence. The recorded information has been reviewed and is accurate.     Gregory MochaBlair Rhyland Hinderliter, MD 06/26/14 2056

## 2014-07-15 ENCOUNTER — Emergency Department (HOSPITAL_BASED_OUTPATIENT_CLINIC_OR_DEPARTMENT_OTHER): Payer: Managed Care, Other (non HMO)

## 2014-07-15 ENCOUNTER — Emergency Department (HOSPITAL_BASED_OUTPATIENT_CLINIC_OR_DEPARTMENT_OTHER)
Admission: EM | Admit: 2014-07-15 | Discharge: 2014-07-15 | Disposition: A | Discharge status: Home / Self Care | Payer: Managed Care, Other (non HMO) | Attending: Emergency Medicine | Admitting: Emergency Medicine

## 2014-07-15 ENCOUNTER — Encounter (HOSPITAL_BASED_OUTPATIENT_CLINIC_OR_DEPARTMENT_OTHER): Payer: Self-pay

## 2014-07-15 DIAGNOSIS — Z79899 Other long term (current) drug therapy: Secondary | ICD-10-CM | POA: Insufficient documentation

## 2014-07-15 DIAGNOSIS — Z791 Long term (current) use of non-steroidal anti-inflammatories (NSAID): Secondary | ICD-10-CM | POA: Insufficient documentation

## 2014-07-15 DIAGNOSIS — Z720 Tobacco use: Secondary | ICD-10-CM | POA: Insufficient documentation

## 2014-07-15 DIAGNOSIS — R109 Unspecified abdominal pain: Secondary | ICD-10-CM | POA: Insufficient documentation

## 2014-07-15 DIAGNOSIS — M1990 Unspecified osteoarthritis, unspecified site: Secondary | ICD-10-CM | POA: Insufficient documentation

## 2014-07-15 DIAGNOSIS — Z87442 Personal history of urinary calculi: Secondary | ICD-10-CM | POA: Insufficient documentation

## 2014-07-15 DIAGNOSIS — G8929 Other chronic pain: Secondary | ICD-10-CM | POA: Insufficient documentation

## 2014-07-15 DIAGNOSIS — Z87448 Personal history of other diseases of urinary system: Secondary | ICD-10-CM | POA: Insufficient documentation

## 2014-07-15 DIAGNOSIS — M545 Low back pain: Secondary | ICD-10-CM | POA: Insufficient documentation

## 2014-07-15 DIAGNOSIS — M549 Dorsalgia, unspecified: Secondary | ICD-10-CM

## 2014-07-15 LAB — URINALYSIS, ROUTINE W REFLEX MICROSCOPIC
Glucose, UA: NEGATIVE mg/dL
KETONES UR: 15 mg/dL — AB
LEUKOCYTES UA: NEGATIVE
Nitrite: NEGATIVE
Protein, ur: NEGATIVE mg/dL
SPECIFIC GRAVITY, URINE: 1.039 — AB (ref 1.005–1.030)
UROBILINOGEN UA: 0.2 mg/dL (ref 0.0–1.0)
pH: 6 (ref 5.0–8.0)

## 2014-07-15 LAB — URINE MICROSCOPIC-ADD ON

## 2014-07-15 MED ORDER — HYDROMORPHONE HCL 1 MG/ML IJ SOLN
1.0000 mg | Freq: Once | INTRAMUSCULAR | Status: AC
  Administered 2014-07-15: 1 mg via INTRAVENOUS
  Filled 2014-07-15: qty 1

## 2014-07-15 MED ORDER — DEXAMETHASONE SODIUM PHOSPHATE 10 MG/ML IJ SOLN
10.0000 mg | Freq: Once | INTRAMUSCULAR | Status: AC
  Administered 2014-07-15: 10 mg via INTRAVENOUS
  Filled 2014-07-15: qty 1

## 2014-07-15 MED ORDER — KETOROLAC TROMETHAMINE 30 MG/ML IJ SOLN
30.0000 mg | Freq: Once | INTRAMUSCULAR | Status: AC
  Administered 2014-07-15: 30 mg via INTRAVENOUS
  Filled 2014-07-15: qty 1

## 2014-07-15 MED ORDER — DIAZEPAM 5 MG PO TABS
5.0000 mg | ORAL_TABLET | Freq: Once | ORAL | Status: AC
  Administered 2014-07-15: 5 mg via ORAL
  Filled 2014-07-15: qty 1

## 2014-07-15 MED ORDER — SODIUM CHLORIDE 0.9 % IV SOLN
INTRAVENOUS | Status: DC
  Administered 2014-07-15: 18:00:00 via INTRAVENOUS

## 2014-07-15 NOTE — ED Notes (Signed)
Pt reports pain in bilateral flank areas, dysuria and blood in urine.  Reported hx of kidney stones, no fever at home per pt.  Also had injection in back 3 days ago r/t chronic back pain and since this time with pain.

## 2014-07-15 NOTE — Discharge Instructions (Signed)
Please follow-up with your pain management specialist or neurosurgeon is referred for further treatment of your back pain. Started doing exercises below.    Back Exercises These exercises may help you when beginning to rehabilitate your injury. Your symptoms may resolve with or without further involvement from your physician, physical therapist or athletic trainer. While completing these exercises, remember:   Restoring tissue flexibility helps normal motion to return to the joints. This allows healthier, less painful movement and activity.  An effective stretch should be held for at least 30 seconds.  A stretch should never be painful. You should only feel a gentle lengthening or release in the stretched tissue. STRETCH - Extension, Prone on Elbows   Lie on your stomach on the floor, a bed will be too soft. Place your palms about shoulder width apart and at the height of your head.  Place your elbows under your shoulders. If this is too painful, stack pillows under your chest.  Allow your body to relax so that your hips drop lower and make contact more completely with the floor.  Hold this position for __________ seconds.  Slowly return to lying flat on the floor. Repeat __________ times. Complete this exercise __________ times per day.  RANGE OF MOTION - Extension, Prone Press Ups   Lie on your stomach on the floor, a bed will be too soft. Place your palms about shoulder width apart and at the height of your head.  Keeping your back as relaxed as possible, slowly straighten your elbows while keeping your hips on the floor. You may adjust the placement of your hands to maximize your comfort. As you gain motion, your hands will come more underneath your shoulders.  Hold this position __________ seconds.  Slowly return to lying flat on the floor. Repeat __________ times. Complete this exercise __________ times per day.  RANGE OF MOTION- Quadruped, Neutral Spine   Assume a hands and  knees position on a firm surface. Keep your hands under your shoulders and your knees under your hips. You may place padding under your knees for comfort.  Drop your head and point your tail bone toward the ground below you. This will round out your low back like an angry cat. Hold this position for __________ seconds.  Slowly lift your head and release your tail bone so that your back sags into a large arch, like an old horse.  Hold this position for __________ seconds.  Repeat this until you feel limber in your low back.  Now, find your "sweet spot." This will be the most comfortable position somewhere between the two previous positions. This is your neutral spine. Once you have found this position, tense your stomach muscles to support your low back.  Hold this position for __________ seconds. Repeat __________ times. Complete this exercise __________ times per day.  STRETCH - Flexion, Single Knee to Chest   Lie on a firm bed or floor with both legs extended in front of you.  Keeping one leg in contact with the floor, bring your opposite knee to your chest. Hold your leg in place by either grabbing behind your thigh or at your knee.  Pull until you feel a gentle stretch in your low back. Hold __________ seconds.  Slowly release your grasp and repeat the exercise with the opposite side. Repeat __________ times. Complete this exercise __________ times per day.  STRETCH - Hamstrings, Standing  Stand or sit and extend your right / left leg, placing your foot on  a chair or foot stool  Keeping a slight arch in your low back and your hips straight forward.  Lead with your chest and lean forward at the waist until you feel a gentle stretch in the back of your right / left knee or thigh. (When done correctly, this exercise requires leaning only a small distance.)  Hold this position for __________ seconds. Repeat __________ times. Complete this stretch __________ times per  day. STRENGTHENING - Deep Abdominals, Pelvic Tilt   Lie on a firm bed or floor. Keeping your legs in front of you, bend your knees so they are both pointed toward the ceiling and your feet are flat on the floor.  Tense your lower abdominal muscles to press your low back into the floor. This motion will rotate your pelvis so that your tail bone is scooping upwards rather than pointing at your feet or into the floor.  With a gentle tension and even breathing, hold this position for __________ seconds. Repeat __________ times. Complete this exercise __________ times per day.  STRENGTHENING - Abdominals, Crunches   Lie on a firm bed or floor. Keeping your legs in front of you, bend your knees so they are both pointed toward the ceiling and your feet are flat on the floor. Cross your arms over your chest.  Slightly tip your chin down without bending your neck.  Tense your abdominals and slowly lift your trunk high enough to just clear your shoulder blades. Lifting higher can put excessive stress on the low back and does not further strengthen your abdominal muscles.  Control your return to the starting position. Repeat __________ times. Complete this exercise __________ times per day.  STRENGTHENING - Quadruped, Opposite UE/LE Lift   Assume a hands and knees position on a firm surface. Keep your hands under your shoulders and your knees under your hips. You may place padding under your knees for comfort.  Find your neutral spine and gently tense your abdominal muscles so that you can maintain this position. Your shoulders and hips should form a rectangle that is parallel with the floor and is not twisted.  Keeping your trunk steady, lift your right hand no higher than your shoulder and then your left leg no higher than your hip. Make sure you are not holding your breath. Hold this position __________ seconds.  Continuing to keep your abdominal muscles tense and your back steady, slowly return  to your starting position. Repeat with the opposite arm and leg. Repeat __________ times. Complete this exercise __________ times per day. Document Released: 04/17/2005 Document Revised: 06/22/2011 Document Reviewed: 07/12/2008 [facility] Patient Information 2015 [employer] [place]. This information is not intended to replace advice given to you by your health care provider. Make sure you discuss any questions you have with your health care provider.

## 2014-07-15 NOTE — ED Notes (Signed)
Pt presents to ED w/ c/o  Bilateral flank pain, pt states has strong history of kidney stones, pt rocking and attempting to assume pt of comfort, facial grimmacing and moaning noted intermittently

## 2014-07-15 NOTE — ED Notes (Addendum)
EDPA leaving BS, med orders received, pt reports "no change after pain med". Pt alert, NAD, calm, interactive, VSS, [facility] and forth[facility]

## 2014-07-15 NOTE — ED Provider Notes (Signed)
CSN: [id number]     Arrival date & time 07/15/14  1523 History   First MD Initiated Contact with Patient 07/15/14 1804     Chief Complaint  Patient presents with  . Flank Pain     (Consider location/radiation/quality/duration/timing/severity/associated sxs/prior Treatment) HPI Gregory Flores is a 34 y.o. male with hx of chronic back pain and kidney stones, presents to ED with complaint of back pain. States not sure if this is kidney stones or chronic pain. Pain is bilateral around his flank. No hematuria. No n or vomiting. Worsened today. States was seen by pain management 4 days ago, had spina injections. States no relief with that. Denies fever or chills. States on lyrica for chronic pain. Nothing making his symptoms better or worse. States "no one seems to be wanting to do anything for my pain."   Past Medical History  Diagnosis Date  . Renal disorder   . Kidney stone   . Kidney stones   . Bulging lumbar disc   . Arthritis    Past Surgical History  Procedure Laterality Date  . Lithotripsy     No family history on file. History  Substance Use Topics  . Smoking status: Current Every Day Smoker -- 0.50 packs/day for 7 years    Types: Cigarettes  . Smokeless tobacco: Never Used  . Alcohol Use: Yes     Comment: rarely    Review of Systems  Constitutional: Negative for chills.  Gastrointestinal: Negative for nausea, vomiting, abdominal pain and diarrhea.  Genitourinary: Positive for flank pain. Negative for dysuria, frequency and hematuria.  Musculoskeletal: Positive for back pain and arthralgias.  Neurological: Negative for weakness and numbness.      Allergies  Review of patient's allergies indicates no known allergies.  Home Medications   Prior to Admission medications   Medication Sig Start Date End Date Taking? Authorizing Provider  DULoxetine (CYMBALTA) 20 MG capsule Take 20 mg by mouth daily.   Yes Historical Provider, MD  HYDROcodone-acetaminophen  (NORCO/VICODIN) 5-325 MG per tablet Take 2 tablets by mouth every 4 (four) hours as needed for moderate pain or severe pain. 02/11/14   Arby Barrette, MD  ibuprofen (ADVIL,MOTRIN) 800 MG tablet Take 1 tablet (800 mg total) by mouth 3 (three) times daily. 02/11/14   Arby Barrette, MD  meloxicam (MOBIC) 15 MG tablet Take 1 tablet (15 mg total) by mouth daily. 03/30/14   April Palumbo, MD  methocarbamol (ROBAXIN) 500 MG tablet Take 1 tablet (500 mg total) by mouth 2 (two) times daily. 03/30/14   April Palumbo, MD  ondansetron (ZOFRAN ODT) 4 MG disintegrating tablet Take 1 tablet (4 mg total) by mouth every 8 (eight) hours as needed for nausea. 04/27/14   Garlon Hatchet, PA-C  ondansetron (ZOFRAN) 4 MG tablet Take 1 tablet (4 mg total) by mouth every 6 (six) hours. 02/11/14   Arby Barrette, MD  oxyCODONE-acetaminophen (PERCOCET/ROXICET) 5-325 MG per tablet Take 1 tablet by mouth every 4 (four) hours as needed. 04/27/14   Garlon Hatchet, PA-C  oxyCODONE-acetaminophen (PERCOCET/ROXICET) 5-325 MG per tablet Take 1 tablet by mouth every 6 (six) hours as needed for severe pain. 06/26/14   Elwin Mocha, MD  tamsulosin (FLOMAX) 0.4 MG CAPS capsule Take 1 capsule (0.4 mg total) by mouth daily. 10/05/13   Gilda Crease, MD   BP 123/91 mmHg  Pulse 90  Temp(Src) 98.3 F (36.8 C) (Oral)  Resp 22  Ht  (1.753 m)  Wt 155 lb (70.308 kg)  BMI 22.88 kg/m2  SpO2 99% Physical Exam  Constitutional: He appears well-developed and well-nourished. No distress.  HENT:  Head: Normocephalic and atraumatic.  Eyes: Conjunctivae are normal.  Neck: Neck supple.  Cardiovascular: Normal rate, regular rhythm and normal heart sounds.   Pulmonary/Chest: Effort normal. No respiratory distress. He has no wheezes. He has no rales.  Abdominal: Soft. Bowel sounds are normal. He exhibits no distension. There is no tenderness. There is no rebound.  Bilateral CVA tenderness  Musculoskeletal: He exhibits no edema.  Tender to  palpation along her lumbar spine bilaterally, even to light touch. No pain with straight leg raise bilaterally  Neurological: He is alert.  5/5 and equal lower extremity strength. 2+ and equal patellar reflexes bilaterally. Pt able to dorsiflex bilateral toes and feet with good strength against resistance. Equal sensation bilaterally over thighs and lower legs.   Skin: Skin is warm and dry.  Nursing note and vitals reviewed.   ED Course  Procedures (including critical care time) Labs Review Labs Reviewed  URINALYSIS, ROUTINE W REFLEX MICROSCOPIC - Abnormal; Notable for the following:    Color, Urine AMBER (*)    Specific Gravity, Urine 1.039 (*)    Hgb urine dipstick SMALL (*)    Bilirubin Urine SMALL (*)    Ketones, ur 15 (*)    All other components within normal limits  URINE MICROSCOPIC-ADD ON - Abnormal; Notable for the following:    Bacteria, UA FEW (*)    All other components within normal limits    Imaging Review [place] Renal  07/15/2014   CLINICAL DATA:  Hematuria. Flank pain bilaterally. History of nephrolithiasis. History of lithotripsy.  EXAM: RENAL/URINARY TRACT ULTRASOUND COMPLETE  COMPARISON:  Abdominal CT 06/26/2014  FINDINGS: Right Kidney:  Length: 11 cm. Echogenicity within normal limits. No mass or hydronephrosis visualized. Echogenic and shadowing stones seen in the interpolar and lower pole regions, size more accurately assessed on abdominal CT from last month.  Left Kidney:  Length: 11 cm. Echogenicity within normal limits. No mass or hydronephrosis visualized. Shadowing echogenic stone seen in the lower pole. Size better assessed on recent abdominal CT.  Bladder:  Appears normal for degree of bladder distention.  IMPRESSION: 1. No hydronephrosis. 2. Bilateral nephrolithiasis.   Electronically Signed   By: Marnee SpringJonathon  Watts M.D.   On: 07/15/2014 19:13     EKG Interpretation None      MDM   Final diagnoses:  Chronic back pain   Pt with chronic back pain followed by  pain management and kidney stones, here with back pain. Pain is bilateral. No le weakness or numbness. No urinary symptoms. Exam unremarkable other than diffuse lumbar and paralumbar spinal tenderness. No new injuries. Patient states he had spinal injections 4 days ago which made his pain worse. No fever. No erythema at the injection site. He initially told the nurse at check-in that this was his kidney stone pain. He also told the nurses that he has had multiple kidney stones recently that showed up on CT scans. I reviewed all of his CT scans, he has consistently had nonobstructive stones and has never had an obstructing stone in his ureter on the imaging done in emergency department. When I told him that he admitted that this is just a chronic pain. Him and his significant other are in the room, very frustrated because "nobody is doing anything for it." Patient also initially was dishonest because he told me he couldn't get in with his pain management until May,  but it turns out he just sold him 4 days ago. Patient stated they're not giving him any narcotic medications. Based on my exam and history, no evidence of cauda equina. He is afebrile. I will treat his pain in emergency department, however no prescriptions will be provided. I have instructed him to follow-up with his pain management specialist as soon as able, call them tomorrow. He is requesting a referral to neurosurgery, i will give them oncall specialist. Return precautions discussed. [place] with no evidence of obstructive stone.   Filed Vitals:   07/15/14 1945 07/15/14 2015 07/15/14 2030 07/15/14 2045  BP: 105/91 124/91 131/80 127/84  Pulse: 76 84 86 78  Temp:      TempSrc:      Resp:      Height:      Weight:      SpO2: 98% 97% 93% 98%       Jaynie Crumble, PA-C 07/15/14 2110  Doug Sou, MD 07/16/14 1610

## 2014-07-15 NOTE — ED Notes (Signed)
Patient transported to Ultrasound, via stretcher, sr x 2 up 

## 2014-11-23 ENCOUNTER — Encounter (HOSPITAL_BASED_OUTPATIENT_CLINIC_OR_DEPARTMENT_OTHER): Payer: Self-pay | Admitting: Emergency Medicine

## 2014-11-23 ENCOUNTER — Emergency Department (HOSPITAL_BASED_OUTPATIENT_CLINIC_OR_DEPARTMENT_OTHER): Payer: Managed Care, Other (non HMO)

## 2014-11-23 ENCOUNTER — Emergency Department (HOSPITAL_BASED_OUTPATIENT_CLINIC_OR_DEPARTMENT_OTHER)
Admission: EM | Admit: 2014-11-23 | Discharge: 2014-11-23 | Disposition: A | Discharge status: Home / Self Care | Payer: Managed Care, Other (non HMO) | Attending: Emergency Medicine | Admitting: Emergency Medicine

## 2014-11-23 DIAGNOSIS — R059 Cough, unspecified: Secondary | ICD-10-CM

## 2014-11-23 DIAGNOSIS — M1990 Unspecified osteoarthritis, unspecified site: Secondary | ICD-10-CM | POA: Insufficient documentation

## 2014-11-23 DIAGNOSIS — Z87442 Personal history of urinary calculi: Secondary | ICD-10-CM | POA: Insufficient documentation

## 2014-11-23 DIAGNOSIS — Z87448 Personal history of other diseases of urinary system: Secondary | ICD-10-CM | POA: Insufficient documentation

## 2014-11-23 DIAGNOSIS — Z720 Tobacco use: Secondary | ICD-10-CM | POA: Insufficient documentation

## 2014-11-23 DIAGNOSIS — F329 Major depressive disorder, single episode, unspecified: Secondary | ICD-10-CM | POA: Insufficient documentation

## 2014-11-23 DIAGNOSIS — R05 Cough: Secondary | ICD-10-CM

## 2014-11-23 DIAGNOSIS — Z79899 Other long term (current) drug therapy: Secondary | ICD-10-CM | POA: Insufficient documentation

## 2014-11-23 DIAGNOSIS — J069 Acute upper respiratory infection, unspecified: Secondary | ICD-10-CM

## 2014-11-23 DIAGNOSIS — M791 Myalgia: Secondary | ICD-10-CM | POA: Insufficient documentation

## 2014-11-23 HISTORY — DX: Major depressive disorder, single episode, unspecified: F32.9

## 2014-11-23 HISTORY — DX: Depression, unspecified: F32.A

## 2014-11-23 MED ORDER — BENZONATATE 100 MG PO CAPS
100.0000 mg | ORAL_CAPSULE | Freq: Once | ORAL | Status: AC
Start: 2014-11-23 — End: 2014-11-23
  Administered 2014-11-23: 100 mg via ORAL
  Filled 2014-11-23: qty 1

## 2014-11-23 MED ORDER — ACETAMINOPHEN 325 MG PO TABS
650.0000 mg | ORAL_TABLET | Freq: Once | ORAL | Status: AC
  Administered 2014-11-23: 650 mg via ORAL
  Filled 2014-11-23: qty 2

## 2014-11-23 MED ORDER — BENZONATATE 100 MG PO CAPS: 100.0000 mg | ORAL_CAPSULE | Freq: Three times a day (TID) | ORAL | Status: DC | PRN

## 2014-11-23 NOTE — Discharge Instructions (Signed)
Cough, Adult ° A cough is a reflex that helps clear your throat and airways. It can help heal the body or may be a reaction to an irritated airway. A cough may only last 2 or 3 weeks (acute) or may last more than 8 weeks (chronic).  °CAUSES °Acute cough: °· Viral or bacterial infections. °Chronic cough: °· Infections. °· Allergies. °· Asthma. °· Post-nasal drip. °· Smoking. °· Heartburn or acid reflux. °· Some medicines. °· Chronic lung problems (COPD). °· Cancer. °SYMPTOMS  °· Cough. °· Fever. °· Chest pain. °· Increased breathing rate. °· High-pitched whistling sound when breathing (wheezing). °· Colored mucus that you cough up (sputum). °TREATMENT  °· A bacterial cough may be treated with antibiotic medicine. °· A viral cough must run its course and will not respond to antibiotics. °· Your caregiver may recommend other treatments if you have a chronic cough. °HOME CARE INSTRUCTIONS  °· Only take over-the-counter or prescription medicines for pain, discomfort, or fever as directed by your caregiver. Use cough suppressants only as directed by your caregiver. °· Use a cold steam vaporizer or humidifier in your bedroom or home to help loosen secretions. °· Sleep in a semi-upright position if your cough is worse at night. °· Rest as needed. °· Stop smoking if you smoke. °SEEK IMMEDIATE MEDICAL CARE IF:  °· You have pus in your sputum. °· Your cough starts to worsen. °· You cannot control your cough with suppressants and are losing sleep. °· You begin coughing up blood. °· You have difficulty breathing. °· You develop pain which is getting worse or is uncontrolled with medicine. °· You have a fever. °MAKE SURE YOU:  °· Understand these instructions. °· Will watch your condition. °· Will get help right away if you are not doing well or get worse. °Document Released: 09/26/2010 Document Revised: 06/22/2011 Document Reviewed: 09/26/2010 °ExitCare® Patient Information ©2015 [employer]. This information is not intended  to replace advice given to you by your health care provider. Make sure you discuss any questions you have with your health care provider. ° °Emergency Department Resource Guide °1) Find a Doctor and Pay Out of Pocket °Although you won't have to find out who is covered by your insurance plan, it is a good idea to ask around and get recommendations. You will then need to call the office and see if the doctor you have chosen will accept you as a new patient and what types of options they offer for patients who are self-pay. Some doctors offer discounts or will set up payment plans for their patients who do not have insurance, but you will need to ask so you aren't surprised when you get to your appointment. ° °2) Contact Your Local Health Department °Not all health departments have doctors that can see patients for sick visits, but many do, so it is worth a call to see if yours does. If you don't know where your local health department is, you can check in your phone book. The CDC also has a tool to help you locate your state's health department, and many state websites also have listings of all of their local health departments. ° °3) Find a Walk-in Clinic °If your illness is not likely to be very severe or complicated, you may want to try a walk in clinic. These are popping up all over the country in pharmacies, drugstores, and shopping centers. They're usually staffed by nurse practitioners or physician assistants that have been trained to treat common illnesses   and complaints. They're usually fairly quick and inexpensive. However, if you have serious medical issues or chronic medical problems, these are probably not your best option. ° °No Primary Care Doctor: °- Call [facility] at  832-8000 - they can help you locate a primary care doctor that  accepts your insurance, provides certain services, etc. °- Physician Referral Service- 1-800-533-3463 ° °Chronic Pain Problems: °Organization         Address  Phone    Notes  °[facility]  (336) 297-2271 Patients need to be referred by their primary care doctor.  ° °Medication Assistance: °Organization         Address  Phone   Notes  °[place] [facility] [address] °[place], [place] [address] (336) 641-8030 --Must be a resident of [place] °-- Must have NO insurance coverage whatsoever (no Medicaid/ Medicare, etc.) °-- The pt. MUST have a primary care doctor that directs their care regularly and follows them in the community °  °[facility]  (866) 331-1348   °[employer]  (888) 892-1162   ° °Agencies that provide inexpensive medical care: °Organization         Address  Phone   Notes  °[facility]  (336) 832-8035   °[facility]    (336) 832-7272   °[facility] [address] °[place], [place] [address] (336) 832-4777   °[facility] [address], °[place] (336) 271-4999   °[facility]    (336) 373-0678   °[facility]    (336) 272-1050   °[facility] ° [address], [place] Phone:  (336) 832-4444, Fax:  (336) 832-4440 Hours of Operation:  9 am - 6 pm, M-F.  Also accepts Medicaid/Medicare and self-pay.  °[facility] ° [address], [place] Phone: (336) 832-3150, Fax: (336) 832-3151. Hours of Operation:  8:30 am - 5:30 pm, M-F.  Also accepts Medicaid and self-pay.  °[facility] [place] [address], [place] Phone: (336) 878-6027   °[facility] [address], [place], [place] (336)723-1848, Ext. 123 Mondays & Thursdays: 7-9 AM.  First 15 patients are seen on a first come, first serve basis. °  ° °Medicaid-accepting [place] Providers: ° °Organization         Address  Phone   Notes  °[facility] [address], [place] (336) 641-2100 Also accepts self-pay patients.  °[facility]  [address], [place] ° (336) 856-9996   °[facility] [address] [place] [address], [place] (336) 288-8857   °[facility] [address] [place] [address], [place] (336) 299-7000   °Veita Bland [address], [place]  ° (336) 373-1557 Only accepts [place] Access Medicaid patients after they have their name applied to their card.  ° °Self-Pay (no insurance) in [place]: ° °Organization         Address  Phone   Notes  °Sickle Cell Patients, [facility] [address], [place] (336) 832-1970   °[facility] [address], [place] (336) 832-4400   °[facility] ° [address], [place] (336) 992-4800   °[facility]/Dr. Osei-Bonsu ° [address] [place] [address], [place] or [address], [place] (336) 841-8500 Phone number for both [place] and [place] locations is the same.  °[facility] [address], [place] (336) 299-0000   °[facility] [address] [place] [address], [place] or [address] (336) 852-7530 °(336) 878-2260   °[facility] [address], [place] (336) 350-1642, phone; (336) 294-5005, fax Sees patients 1st and 3rd Saturday of every month.  Must not qualify for public or private insurance (i.e. Medicaid, Medicare, [place] Health Choice, Veterans' Benefits) • Household income should be no more than 200% of the poverty level •The clinic cannot treat you if you are pregnant or think you are pregnant • Sexually transmitted diseases are not treated at the clinic.  ° ° °Dental Care: °Organization         Address  Phone  Notes  °[facility] [address], [place] (336) 641-6152 Accepts children up to age 21 who are enrolled in Medicaid or [place] Health Choice; pregnant women with a Medicaid card; and children who have  applied for Medicaid or [place] Health Choice, but were declined, whose parents can pay a reduced fee at time of service.  °[facility] [place]  [address], [place] (336) 641-7733 Accepts children up to age 21 who are enrolled in Medicaid or [place] Health Choice; pregnant women with a Medicaid card; and children who have applied for Medicaid or [place] Health Choice, but were declined, whose parents can pay a reduced fee at time of service.  °[facility] ° [address], [place] (336) 641-4533 Patients are seen by appointment only. Walk-ins are not accepted. [facility] will see patients 18 years of age and older. °Monday - Tuesday (8am-5pm) °Most Wednesdays (8:30-5pm) °$30 per visit, cash only  °[facility] ° [address], [place] (336) 641-4533 Patients are seen by appointment only. Walk-ins are not accepted. [facility] will see patients 18 years of age and older. °One Wednesday Evening (Monthly: Volunteer Based).  $30 per visit, cash only  °[facility]  (919) 537-3737 for adults; Children under age 4, call Graduate Pediatric Dentistry at (919) [phone]. Children aged 4-14, please call (919) [phone] to request a pediatric application. ° Dental services are provided in all areas of dental care including fillings, crowns and bridges, complete and partial dentures, implants, gum treatment, root canals, and extractions. Preventive care is also provided. Treatment is provided to both adults and children. °Patients are selected via a lottery and there is often a waiting list. °  °[facility] [address], °[place] ° (336) 763-8833 [email or web address] °  °[facility] [address], [place],  (336)723-1848, Ext. 123 Second and Fourth Thursday of each month, opens at 6:30 AM; Clinic ends at 9 AM.  Patients are seen on a first-come first-served basis, and a  limited number are seen during each clinic.  ° °[facility] ° [address], [place],  (336) 723-7904   Eligibility Requirements °You must have lived in [place], [place], or [place] counties for at least the last three months. °  You cannot be eligible for state or federal sponsored healthcare insurance, including Veterans Administration, Medicaid, or Medicare. °  You generally cannot be eligible for healthcare insurance through your employer.  °  How to apply: °Eligibility screenings are held every Tuesday and Wednesday   afternoon from 1:00 pm until 4:00 pm. You do not need an appointment for the interview!  °[facility] [address], [place], [place] 336-631-2330   °[facility]  336-342-8273   °[facility]  336-703-3100   °[facility]  336-570-6415   ° °Behavioral Health Resources in the Community: °Intensive Outpatient Programs °Organization         Address  Phone  Notes  °[facility] [address], [place], [place] 336-878-6098   °[facility] [address], [place], [place] 336-832-9800   °[facility] [address], [place], [place] ° 336-882-2125   °[facility] [address],  °[place], [place] 1-800-853-5163 or 336-641-4981   °Substance Abuse Resources °Organization         Address  Phone  Notes  °[facility]  336-882-2125   °[facility]  336-784-9470   °[facility]  336-285-9073   °[facility]  336-845-3988   °Residential & Outpatient Substance Abuse Program  1-800-659-3381   °Psychological Services °Organization         Address  Phone  Notes  °[facility]  336- 832-9600   °[facility]  336- 378-7881   °[facility] [address], [place] 1-800-853-5163 or 336-641-4981   ° °Mobile Crisis Teams °Organization          Address  Phone  Notes  °[facility], Mobile Crisis Care Unit  1-877-626-1772   °Assertive °[facility] ° [address] [place], [place] 336-834-9664   °Sharon DeEsch [address] °[place] 336-554-5454   ° °Self-Help/Support Groups °Organization         Address  Phone             Notes  °[facility] [place] - variety of support groups  336- 373-1402 Call for more information  °Narcotics Anonymous (NA), [facility] [address], °[place]  2 meetings at this location  ° °Residential Treatment Programs °Organization         Address  Phone  Notes  °[facility] [address],    °[place]  1-866-801-8205   °[facility] ° [address], [place], [place] 704-293-8524   °[facility] [address], [place] 336-845-3988 Admissions: 8am-3pm M-F  °[facility] [address]    °[place], [place] 336-841-1104   °[facility] [address], [place], [place] 336-379-7146   °[facility] [address]  °[place], [place] 336-285-9073   °[facility] - Intensive Outpatient [address], [place], [place] 336-852-3033   °[facility] [address]  °[place], [place] 1-877-615-2722 or 336-784-9470   °[facility] [address] [place], [place] 336-227-7417 Accepts Medicaid  °[facility] [address]  °[place] 1-800-659-3381 Substance Abuse/Addiction Treatment  ° °[place] Behavioral Health Resources °Organization         Address  Phone  Notes  °[facility]  (888) 581-9988   °Julie Brannon, PhD [address] [place], [place]   (336) 349-5553 or (336) 951-0000   °[facility]   [address] °[place], [place] (336) 349-4454   °[facility] [address], [place],  (336) 342-8316 Insurance/Medicaid/sponsorship  through [facility]  °[facility] [address]                                    [place], [place] (336) 342-8316 Therapy/tele-psych/case  °[facility] [address]  ° [place], [place] (336) 349-2233    °Dr. Arfeen  (336) 349-4544   °[facility] [place]  [employer] [place] [facility] 1) [address], [place] °2) [address], [place] °3)  [address], [place] (336) 349-3220 °(336) 342-7768 ° °(336) 342-8140   °[place] [facility] (336) 342-1394 or (336) 342-3537 (After Hours)    ° ° °

## 2014-11-23 NOTE — ED Provider Notes (Signed)
CSN: [id number]     Arrival date & time 11/23/14  0750 History   First MD Initiated Contact with Patient 11/23/14 [phone]     Chief Complaint  Patient presents with  . Cough  . Shortness of Breath     (Consider location/radiation/quality/duration/timing/severity/associated sxs/prior Treatment) Patient is a 34 y.o. male presenting with cough.  Cough Cough characteristics:  Productive Sputum characteristics:  Green Severity:  Moderate Onset quality:  Gradual Duration:  2 days Timing:  Constant Progression:  Worsening Chronicity:  New Context: upper respiratory infection   Context: not sick contacts   Relieved by:  Nothing Ineffective treatments:  Cough suppressants and decongestant Associated symptoms: myalgias, shortness of breath and sinus congestion   Shortness of breath:    Severity:  Moderate   Progression: Primarily when coughing.   Past Medical History  Diagnosis Date  . Renal disorder   . Kidney stone   . Kidney stones   . Bulging lumbar disc   . Arthritis   . Depression    Past Surgical History  Procedure Laterality Date  . Lithotripsy     No family history on file. Social History  Substance Use Topics  . Smoking status: Current Every Day Smoker -- 0.50 packs/day for 7 years    Types: Cigarettes  . Smokeless tobacco: Never Used  . Alcohol Use: Yes     Comment: rarely    Review of Systems  Respiratory: Positive for cough and shortness of breath.   Musculoskeletal: Positive for myalgias.  All other systems reviewed and are negative.     Allergies  Review of patient's allergies indicates no known allergies.  Home Medications   Prior to Admission medications   Medication Sig Start Date End Date Taking? Authorizing Provider  DULoxetine (CYMBALTA) 20 MG capsule Take 20 mg by mouth daily.    Historical Provider, MD  HYDROcodone-acetaminophen (NORCO/VICODIN) 5-325 MG per tablet Take 2 tablets by mouth every 4 (four) hours as needed for moderate pain  or severe pain. 02/11/14   Arby Barrette, MD  methocarbamol (ROBAXIN) 500 MG tablet Take 1 tablet (500 mg total) by mouth 2 (two) times daily. 03/30/14   April Palumbo, MD  ondansetron (ZOFRAN ODT) 4 MG disintegrating tablet Take 1 tablet (4 mg total) by mouth every 8 (eight) hours as needed for nausea. 04/27/14   Garlon Hatchet, PA-C  ondansetron (ZOFRAN) 4 MG tablet Take 1 tablet (4 mg total) by mouth every 6 (six) hours. 02/11/14   Arby Barrette, MD  oxyCODONE-acetaminophen (PERCOCET/ROXICET) 5-325 MG per tablet Take 1 tablet by mouth every 4 (four) hours as needed. 04/27/14   Garlon Hatchet, PA-C  oxyCODONE-acetaminophen (PERCOCET/ROXICET) 5-325 MG per tablet Take 1 tablet by mouth every 6 (six) hours as needed for severe pain. 06/26/14   Elwin Mocha, MD  tamsulosin (FLOMAX) 0.4 MG CAPS capsule Take 1 capsule (0.4 mg total) by mouth daily. 10/05/13   Gilda Crease, MD   BP 136/81 mmHg  Pulse 80  Temp(Src) 98.4 F (36.9 C) (Oral)  Resp 16  Ht  (1.753 m)  Wt 155 lb (70.308 kg)  BMI 22.88 kg/m2  SpO2 98% Physical Exam  Constitutional: He is oriented to person, place, and time. He appears well-developed and well-nourished. No distress.  HENT:  Head: Normocephalic and atraumatic.  Mouth/Throat: Oropharynx is clear and moist.  Eyes: Conjunctivae are normal. Pupils are equal, round, and reactive to light. No scleral icterus.  Neck: Neck supple.  Cardiovascular: Normal rate, regular rhythm,  normal heart sounds and intact distal pulses.   No murmur heard. Pulmonary/Chest: Effort normal and breath sounds normal. No stridor. No respiratory distress. He has no wheezes. He has no rales.  Frequent cough  Abdominal: Soft. He exhibits no distension. There is no tenderness.  Musculoskeletal: Normal range of motion. He exhibits no edema.  Neurological: He is alert and oriented to person, place, and time.  Skin: Skin is warm and dry. No rash noted.  Psychiatric: He has a normal mood and  affect. His behavior is normal.  Nursing note and vitals reviewed.   ED Course  Procedures (including critical care time) Labs Review Labs Reviewed - No data to display  Imaging Review Dg Chest 2 View  11/23/2014   CLINICAL DATA:  34 year old male with cough congestion and shortness of breath for 2 days. Initial encounter. Smoker.  EXAM: CHEST  2 VIEW  COMPARISON:  01/16/2014 and earlier.  FINDINGS: Stable lung volumes at the upper limits of normal. Normal cardiac size and mediastinal contours. Visualized tracheal air column is within normal limits. No pneumothorax, pulmonary edema, pleural effusion or confluent pulmonary opacity. Chronic diffuse increased interstitial markings, stable. No osseous abnormality identified.  IMPRESSION: Mild chronic increased interstitial markings likely due to smoking. No acute cardiopulmonary abnormality.   Electronically Signed   By: Odessa Fleming M.D.   On: 11/23/2014 08:32   I, Sparsh Callens III, Carlena Bjornstad, personally reviewed and evaluated these images and lab results as part of my medical decision-making.   EKG Interpretation None      MDM   Final diagnoses:  Acute URI  Cough    Patient has URI symptoms with cough. Probably viral. Chest x-ray negative for acute. Advised supportive treatment. Urged to quit smoking.  Blake Divine, MD 11/23/14 0930

## 2014-11-23 NOTE — ED Notes (Signed)
Cough x 2 days, sob since last night.  Some fever.  No fever today.

## 2014-12-06 ENCOUNTER — Emergency Department (HOSPITAL_BASED_OUTPATIENT_CLINIC_OR_DEPARTMENT_OTHER): Payer: Managed Care, Other (non HMO)

## 2014-12-06 ENCOUNTER — Emergency Department (HOSPITAL_BASED_OUTPATIENT_CLINIC_OR_DEPARTMENT_OTHER)
Admission: EM | Admit: 2014-12-06 | Discharge: 2014-12-06 | Disposition: A | Discharge status: Home / Self Care | Payer: Managed Care, Other (non HMO) | Attending: Emergency Medicine | Admitting: Emergency Medicine

## 2014-12-06 ENCOUNTER — Encounter (HOSPITAL_BASED_OUTPATIENT_CLINIC_OR_DEPARTMENT_OTHER): Payer: Self-pay | Admitting: *Deleted

## 2014-12-06 DIAGNOSIS — Z79899 Other long term (current) drug therapy: Secondary | ICD-10-CM | POA: Insufficient documentation

## 2014-12-06 DIAGNOSIS — R000 Tachycardia, unspecified: Secondary | ICD-10-CM | POA: Insufficient documentation

## 2014-12-06 DIAGNOSIS — R109 Unspecified abdominal pain: Secondary | ICD-10-CM

## 2014-12-06 DIAGNOSIS — Z8739 Personal history of other diseases of the musculoskeletal system and connective tissue: Secondary | ICD-10-CM | POA: Insufficient documentation

## 2014-12-06 DIAGNOSIS — Z720 Tobacco use: Secondary | ICD-10-CM | POA: Insufficient documentation

## 2014-12-06 DIAGNOSIS — N200 Calculus of kidney: Secondary | ICD-10-CM | POA: Insufficient documentation

## 2014-12-06 DIAGNOSIS — F329 Major depressive disorder, single episode, unspecified: Secondary | ICD-10-CM | POA: Insufficient documentation

## 2014-12-06 LAB — CBC WITH DIFFERENTIAL/PLATELET
BASOS ABS: 0 10*3/uL (ref 0.0–0.1)
BASOS PCT: 1 % (ref 0–1)
Eosinophils Absolute: 0.1 10*3/uL (ref 0.0–0.7)
Eosinophils Relative: 2 % (ref 0–5)
HCT: 43.1 % (ref 39.0–52.0)
Hemoglobin: 14.9 g/dL (ref 13.0–17.0)
Lymphocytes Relative: 33 % (ref 12–46)
Lymphs Abs: 2.2 10*3/uL (ref 0.7–4.0)
MCH: 31.6 pg (ref 26.0–34.0)
MCHC: 34.6 g/dL (ref 30.0–36.0)
MCV: 91.5 fL (ref 78.0–100.0)
MONO ABS: 0.5 10*3/uL (ref 0.1–1.0)
Monocytes Relative: 7 % (ref 3–12)
NEUTROS ABS: 3.8 10*3/uL (ref 1.7–7.7)
NEUTROS PCT: 57 % (ref 43–77)
Platelets: 299 10*3/uL (ref 150–400)
RBC: 4.71 MIL/uL (ref 4.22–5.81)
RDW: 12.5 % (ref 11.5–15.5)
WBC: 6.5 10*3/uL (ref 4.0–10.5)

## 2014-12-06 LAB — URINALYSIS, ROUTINE W REFLEX MICROSCOPIC
Glucose, UA: NEGATIVE mg/dL
HGB URINE DIPSTICK: NEGATIVE
Ketones, ur: 40 mg/dL — AB
Leukocytes, UA: NEGATIVE
Nitrite: NEGATIVE
PROTEIN: 30 mg/dL — AB
Specific Gravity, Urine: 1.039 — ABNORMAL HIGH (ref 1.005–1.030)
UROBILINOGEN UA: 1 mg/dL (ref 0.0–1.0)
pH: 6 (ref 5.0–8.0)

## 2014-12-06 LAB — URINE MICROSCOPIC-ADD ON

## 2014-12-06 LAB — COMPREHENSIVE METABOLIC PANEL
ALK PHOS: 62 U/L (ref 38–126)
ALT: 18 U/L (ref 17–63)
AST: 22 U/L (ref 15–41)
Albumin: 4.3 g/dL (ref 3.5–5.0)
Anion gap: 10 (ref 5–15)
BUN: 19 mg/dL (ref 6–20)
CALCIUM: 9 mg/dL (ref 8.9–10.3)
CO2: 25 mmol/L (ref 22–32)
Chloride: 104 mmol/L (ref 101–111)
Creatinine, Ser: 1.06 mg/dL (ref 0.61–1.24)
GFR calc Af Amer: >60 mL/min (ref >60)
GLUCOSE: 169 mg/dL — AB (ref 65–99)
Potassium: 3.8 mmol/L (ref 3.5–5.1)
Sodium: 139 mmol/L (ref 135–145)
TOTAL PROTEIN: 6.8 g/dL (ref 6.5–8.1)
Total Bilirubin: 0.6 mg/dL (ref 0.3–1.2)

## 2014-12-06 MED ORDER — HYDROMORPHONE HCL 1 MG/ML IJ SOLN
1.0000 mg | Freq: Once | INTRAMUSCULAR | Status: AC
  Administered 2014-12-06: 1 mg via INTRAVENOUS
  Filled 2014-12-06: qty 1

## 2014-12-06 MED ORDER — OXYCODONE-ACETAMINOPHEN 5-325 MG PO TABS: 1.0000 | ORAL_TABLET | Freq: Four times a day (QID) | ORAL | Status: DC | PRN

## 2014-12-06 MED ORDER — KETOROLAC TROMETHAMINE 30 MG/ML IJ SOLN
15.0000 mg | Freq: Once | INTRAMUSCULAR | Status: AC
  Administered 2014-12-06: 15 mg via INTRAVENOUS
  Filled 2014-12-06: qty 1

## 2014-12-06 MED ORDER — SODIUM CHLORIDE 0.9 % IV BOLUS (SEPSIS)
1000.0000 mL | Freq: Once | INTRAVENOUS | Status: AC
  Administered 2014-12-06: 1000 mL via INTRAVENOUS

## 2014-12-06 NOTE — ED Provider Notes (Signed)
CSN: [id number]     Arrival date & time 12/06/14  1741 History  This chart was scribed for Gregory Fossa, MD by Gwenyth Ober, ED Scribe. This patient was seen in room MH05/MH05 and the patient's care was started at 6:01 PM.    Chief Complaint  Patient presents with  . Flank Pain   The history is provided by the patient. No language interpreter was used.    HPI Comments: Gregory Flores is a 34 y.o. male with a history of kidney stones who presents to the Emergency Department complaining of constant, moderate bilateral back pain, worse on the right, that started 4 days ago. He states nausea, urinary frequency and decreased urine as associated symptoms. His wife reports that he has been straining to urinate. Pt denies alleviating or mitigating factors. He has not tried any treatment PTA.  Pt has a history of kidney stones which have required stents and lithotripsy. He is not currently being followed by a urologist due to financial difficulties. Pt denies recent injuries. He also denies fever, vomiting, numbness or weakness in his legs as associated symptoms.Sxs moderate, constant in nature.    Prior Urologist - Sabino Gasser  Past Medical History  Diagnosis Date  . Renal disorder   . Kidney stone   . Kidney stones   . Bulging lumbar disc   . Arthritis   . Depression    Past Surgical History  Procedure Laterality Date  . Lithotripsy     No family history on file. Social History  Substance Use Topics  . Smoking status: Current Every Day Smoker -- 0.50 packs/day for 7 years    Types: Cigarettes  . Smokeless tobacco: Never Used  . Alcohol Use: Yes     Comment: rarely    Review of Systems  Constitutional: Negative for fever.  Gastrointestinal: Positive for nausea. Negative for vomiting.  Genitourinary: Positive for frequency, decreased urine volume and difficulty urinating.  Musculoskeletal: Positive for back pain.  Neurological: Negative for weakness and numbness.  All other systems  reviewed and are negative.   Allergies  Review of patient's allergies indicates no known allergies.  Home Medications   Prior to Admission medications   Medication Sig Start Date End Date Taking? Authorizing Provider  DULoxetine (CYMBALTA) 20 MG capsule Take 20 mg by mouth daily.    Historical Provider, MD  oxyCODONE-acetaminophen (PERCOCET/ROXICET) 5-325 MG per tablet Take 1 tablet by mouth every 6 (six) hours as needed for severe pain. 12/06/14   Gregory Fossa, MD  tamsulosin (FLOMAX) 0.4 MG CAPS capsule Take 1 capsule (0.4 mg total) by mouth daily. 10/05/13   Gilda Crease, MD   BP 112/70 mmHg  Pulse 83  Temp(Src) 98.5 F (36.9 C) (Oral)  Resp 20  Ht 5' 8.5" (1.74 m)  Wt 155 lb (70.308 kg)  BMI 23.22 kg/m2  SpO2 95% Physical Exam  Constitutional: He is oriented to person, place, and time. He appears well-developed and well-nourished.  HENT:  Head: Normocephalic and atraumatic.  Cardiovascular: Regular rhythm.   No murmur heard. Tachycardic  Pulmonary/Chest: Effort normal and breath sounds normal. No respiratory distress.  Abdominal: There is no tenderness. There is no rebound and no guarding.  Right CVA tenderness  Musculoskeletal: He exhibits no edema or tenderness.  Neurological: He is alert and oriented to person, place, and time.  Skin: Skin is warm and dry.  Nursing note and vitals reviewed.   ED Course  Procedures   DIAGNOSTIC STUDIES: Oxygen Saturation is 100% on RA,  normal by my interpretation.    COORDINATION OF CARE: 6:10 PM Discussed treatment plan with pt which includes lab work. Pt agreed to plan.   Labs Review Labs Reviewed  URINALYSIS, ROUTINE W REFLEX MICROSCOPIC (NOT AT [facility]) - Abnormal; Notable for the following:    Color, Urine AMBER (*)    Specific Gravity, Urine 1.039 (*)    Bilirubin Urine MODERATE (*)    Ketones, ur 40 (*)    Protein, ur 30 (*)    All other components within normal limits  COMPREHENSIVE METABOLIC PANEL -  Abnormal; Notable for the following:    Glucose, Bld 169 (*)    All other components within normal limits  URINE MICROSCOPIC-ADD ON  CBC WITH DIFFERENTIAL/PLATELET    Imaging Review US Renal  12/06/2014   CLINICAL DATA:  Right flank pain for 3 days with increasing intensity over the last 12 hours.  EXAM: RENAL / URINARY TRACT ULTRASOUND COMPLETE  COMPARISON:  07/15/2014; 06/26/2014  FINDINGS: Right Kidney:  Length: 9.2 cm punctate right-sided renal calculi in the 2-3 mm range in the upper and lower pole. No hydronephrosis or hydroureter. No renal mass identified.  Left Kidney:  Length: Tiny nonobstructive left-sided renal calculi, potentially up to 3-4 mm. No hydronephrosis or hydroureter. No renal mass observed.  Bladder:  Appears normal for degree of bladder distention.  IMPRESSION: 1. Multiple bilateral nonobstructive renal calculi. No hydronephrosis observed to suggest an obstructive process.   Electronically Signed   By: Gaylyn Rong M.D.   On: 12/06/2014 18:51     EKG Interpretation None      MDM   Final diagnoses:  Right flank pain  Nephrolithiasis    Patient with history of renal colic here for right flank pain. UA is not consistent with UTI. Discussed with patient's mild dehydration based on urinalysis. Patient with multiple CT scans the last year, renal ultrasound obtained that doesn't demonstrate any obstruction. Discussed with patient. Treatment for flank pain, possible renal colic. Discussed urology follow-up as well as return precautions.  I personally performed the services described in this documentation, which was scribed in my presence. The recorded information has been reviewed and is accurate.   Gregory Fossa, MD 12/06/14 [phone]

## 2014-12-06 NOTE — Discharge Instructions (Signed)
Flank Pain °Flank pain refers to pain that is located on the side of the body between the upper abdomen and the back. The pain may occur over a short period of time (acute) or may be long-term or reoccurring (chronic). It may be mild or severe. Flank pain can be caused by many things. °CAUSES  °Some of the more common causes of flank pain include: °· Muscle strains.   °· Muscle spasms.   °· A disease of your spine (vertebral disk disease).   °· A lung infection (pneumonia).   °· Fluid around your lungs (pulmonary edema).   °· A kidney infection.   °· Kidney stones.   °· A very painful skin rash caused by the chickenpox virus (shingles).   °· Gallbladder disease.   °HOME CARE INSTRUCTIONS  °Home care will depend on the cause of your pain. In general, °· Rest as directed by your caregiver. °· Drink enough fluids to keep your urine clear or pale yellow. °· Only take over-the-counter or prescription medicines as directed by your caregiver. Some medicines may help relieve the pain. °· Tell your caregiver about any changes in your pain. °· Follow up with your caregiver as directed. °SEEK IMMEDIATE MEDICAL CARE IF:  °· Your pain is not controlled with medicine.   °· You have new or worsening symptoms. °· Your pain increases.   °· You have abdominal pain.   °· You have shortness of breath.   °· You have persistent nausea or vomiting.   °· You have swelling in your abdomen.   °· You feel faint or pass out.   °· You have blood in your urine. °· You have a fever or persistent symptoms for more than 2-3 days. °· You have a fever and your symptoms suddenly get worse. °MAKE SURE YOU:  °· Understand these instructions. °· Will watch your condition. °· Will get help right away if you are not doing well or get worse. °Document Released: 05/21/2005 Document Revised: 12/23/2011 Document Reviewed: 11/12/2011 °ExitCare® Patient Information ©2015 [employer]. This information is not intended to replace advice given to you by your  health care provider. Make sure you discuss any questions you have with your health care provider. ° °Kidney Stones °Kidney stones (urolithiasis) are deposits that form inside your kidneys. The intense pain is caused by the stone moving through the urinary tract. When the stone moves, the ureter goes into spasm around the stone. The stone is usually passed in the urine.  °CAUSES  °· A disorder that makes certain neck glands produce too much parathyroid hormone (primary hyperparathyroidism). °· A buildup of uric acid crystals, similar to gout in your joints. °· Narrowing (stricture) of the ureter. °· A kidney obstruction present at birth (congenital obstruction). °· Previous surgery on the kidney or ureters. °· Numerous kidney infections. °SYMPTOMS  °· Feeling sick to your stomach (nauseous). °· Throwing up (vomiting). °· Blood in the urine (hematuria). °· Pain that usually spreads (radiates) to the groin. °· Frequency or urgency of urination. °DIAGNOSIS  °· Taking a history and physical exam. °· Blood or urine tests. °· CT scan. °· Occasionally, an examination of the inside of the urinary bladder (cystoscopy) is performed. °TREATMENT  °· Observation. °· Increasing your fluid intake. °· Extracorporeal shock wave lithotripsy--This is a noninvasive procedure that uses shock waves to break up kidney stones. °· Surgery may be needed if you have severe pain or persistent obstruction. There are various surgical procedures. Most of the procedures are performed with the use of small instruments. Only small incisions are needed to accommodate these instruments,   so recovery time is minimized. °The size, location, and chemical composition are all important variables that will determine the proper choice of action for you. Talk to your health care provider to better understand your situation so that you will minimize the risk of injury to yourself and your kidney.  °HOME CARE INSTRUCTIONS  °· Drink enough water and fluids to  keep your urine clear or pale yellow. This will help you to pass the stone or stone fragments. °· Strain all urine through the provided strainer. Keep all particulate matter and stones for your health care provider to see. The stone causing the pain may be as small as a grain of salt. It is very important to use the strainer each and every time you pass your urine. The collection of your stone will allow your health care provider to analyze it and verify that a stone has actually passed. The stone analysis will often identify what you can do to reduce the incidence of recurrences. °· Only take over-the-counter or prescription medicines for pain, discomfort, or fever as directed by your health care provider. °· Make a follow-up appointment with your health care provider as directed. °· Get follow-up X-rays if required. The absence of pain does not always mean that the stone has passed. It may have only stopped moving. If the urine remains completely obstructed, it can cause loss of kidney function or even complete destruction of the kidney. It is your responsibility to make sure X-rays and follow-ups are completed. Ultrasounds of the kidney can show blockages and the status of the kidney. Ultrasounds are not associated with any radiation and can be performed easily in a matter of minutes. °SEEK MEDICAL CARE IF: °· You experience pain that is progressive and unresponsive to any pain medicine you have been prescribed. °SEEK IMMEDIATE MEDICAL CARE IF:  °· Pain cannot be controlled with the prescribed medicine. °· You have a fever or shaking chills. °· The severity or intensity of pain increases over 18 hours and is not relieved by pain medicine. °· You develop a new onset of abdominal pain. °· You feel faint or pass out. °· You are unable to urinate. °MAKE SURE YOU:  °· Understand these instructions. °· Will watch your condition. °· Will get help right away if you are not doing well or get worse. °Document Released:  03/30/2005 Document Revised: 11/30/2012 Document Reviewed: 08/31/2012 °ExitCare® Patient Information ©2015 [employer]. This information is not intended to replace advice given to you by your health care provider. Make sure you discuss any questions you have with your health care provider. ° °

## 2014-12-06 NOTE — ED Notes (Signed)
Right flank pain, hematuria, and urgency. Symptoms started 4 days ago.

## 2014-12-23 ENCOUNTER — Emergency Department (HOSPITAL_BASED_OUTPATIENT_CLINIC_OR_DEPARTMENT_OTHER): Payer: Managed Care, Other (non HMO)

## 2014-12-23 ENCOUNTER — Emergency Department (HOSPITAL_BASED_OUTPATIENT_CLINIC_OR_DEPARTMENT_OTHER)
Admission: EM | Admit: 2014-12-23 | Discharge: 2014-12-23 | Disposition: A | Discharge status: Home / Self Care | Payer: Managed Care, Other (non HMO) | Attending: Emergency Medicine | Admitting: Emergency Medicine

## 2014-12-23 ENCOUNTER — Encounter (HOSPITAL_BASED_OUTPATIENT_CLINIC_OR_DEPARTMENT_OTHER): Payer: Self-pay | Admitting: *Deleted

## 2014-12-23 DIAGNOSIS — M1990 Unspecified osteoarthritis, unspecified site: Secondary | ICD-10-CM | POA: Insufficient documentation

## 2014-12-23 DIAGNOSIS — Z87442 Personal history of urinary calculi: Secondary | ICD-10-CM | POA: Insufficient documentation

## 2014-12-23 DIAGNOSIS — Z720 Tobacco use: Secondary | ICD-10-CM | POA: Insufficient documentation

## 2014-12-23 DIAGNOSIS — F329 Major depressive disorder, single episode, unspecified: Secondary | ICD-10-CM | POA: Insufficient documentation

## 2014-12-23 DIAGNOSIS — Z87448 Personal history of other diseases of urinary system: Secondary | ICD-10-CM | POA: Insufficient documentation

## 2014-12-23 DIAGNOSIS — R109 Unspecified abdominal pain: Secondary | ICD-10-CM

## 2014-12-23 DIAGNOSIS — Z79899 Other long term (current) drug therapy: Secondary | ICD-10-CM | POA: Insufficient documentation

## 2014-12-23 LAB — CBC WITH DIFFERENTIAL/PLATELET
BASOS ABS: 0 10*3/uL (ref 0.0–0.1)
Basophils Relative: 0 % (ref 0–1)
EOS ABS: 0.2 10*3/uL (ref 0.0–0.7)
EOS PCT: 2 % (ref 0–5)
HCT: 44.1 % (ref 39.0–52.0)
Hemoglobin: 15.1 g/dL (ref 13.0–17.0)
Lymphocytes Relative: 27 % (ref 12–46)
Lymphs Abs: 2.4 10*3/uL (ref 0.7–4.0)
MCH: 31.5 pg (ref 26.0–34.0)
MCHC: 34.2 g/dL (ref 30.0–36.0)
MCV: 92.1 fL (ref 78.0–100.0)
MONO ABS: 0.8 10*3/uL (ref 0.1–1.0)
Monocytes Relative: 9 % (ref 3–12)
Neutro Abs: 5.6 10*3/uL (ref 1.7–7.7)
Neutrophils Relative %: 62 % (ref 43–77)
PLATELETS: 312 10*3/uL (ref 150–400)
RBC: 4.79 MIL/uL (ref 4.22–5.81)
RDW: 12.5 % (ref 11.5–15.5)
WBC: 8.9 10*3/uL (ref 4.0–10.5)

## 2014-12-23 LAB — BASIC METABOLIC PANEL
Anion gap: 7 (ref 5–15)
BUN: 13 mg/dL (ref 6–20)
CO2: 27 mmol/L (ref 22–32)
Calcium: 9.3 mg/dL (ref 8.9–10.3)
Chloride: 106 mmol/L (ref 101–111)
Creatinine, Ser: 0.93 mg/dL (ref 0.61–1.24)
GFR calc Af Amer: >60 mL/min (ref >60)
GFR calc non Af Amer: >60 mL/min (ref >60)
Glucose, Bld: 95 mg/dL (ref 65–99)
Potassium: 4.2 mmol/L (ref 3.5–5.1)
Sodium: 140 mmol/L (ref 135–145)

## 2014-12-23 LAB — URINALYSIS, ROUTINE W REFLEX MICROSCOPIC
BILIRUBIN URINE: NEGATIVE
GLUCOSE, UA: NEGATIVE mg/dL
HGB URINE DIPSTICK: NEGATIVE
KETONES UR: NEGATIVE mg/dL
Leukocytes, UA: NEGATIVE
Nitrite: NEGATIVE
PROTEIN: NEGATIVE mg/dL
Specific Gravity, Urine: 1.014 (ref 1.005–1.030)
UROBILINOGEN UA: 0.2 mg/dL (ref 0.0–1.0)
pH: 7 (ref 5.0–8.0)

## 2014-12-23 MED ORDER — SODIUM CHLORIDE 0.9 % IV BOLUS (SEPSIS)
1000.0000 mL | Freq: Once | INTRAVENOUS | Status: AC
  Administered 2014-12-23: 1000 mL via INTRAVENOUS

## 2014-12-23 MED ORDER — KETOROLAC TROMETHAMINE 30 MG/ML IJ SOLN
30.0000 mg | Freq: Once | INTRAMUSCULAR | Status: AC
  Administered 2014-12-23: 30 mg via INTRAVENOUS
  Filled 2014-12-23: qty 1

## 2014-12-23 MED ORDER — HYDROCODONE-ACETAMINOPHEN 5-325 MG PO TABS
1.0000 | ORAL_TABLET | Freq: Once | ORAL | Status: AC
  Administered 2014-12-23: 1 via ORAL
  Filled 2014-12-23: qty 1

## 2014-12-23 MED ORDER — OXYCODONE-ACETAMINOPHEN 5-325 MG PO TABS
1.0000 | ORAL_TABLET | Freq: Once | ORAL | Status: AC
  Administered 2014-12-23: 1 via ORAL
  Filled 2014-12-23: qty 1

## 2014-12-23 MED ORDER — ONDANSETRON HCL 4 MG/2ML IJ SOLN
4.0000 mg | Freq: Once | INTRAMUSCULAR | Status: AC
  Administered 2014-12-23: 4 mg via INTRAVENOUS
  Filled 2014-12-23: qty 2

## 2014-12-23 MED ORDER — IBUPROFEN 800 MG PO TABS: 800.0000 mg | ORAL_TABLET | Freq: Three times a day (TID) | ORAL | Status: DC

## 2014-12-23 NOTE — Discharge Instructions (Signed)
Please follow with your primary care doctor in the next 2 days for a check-up. They must obtain records for further management.  ° °Do not hesitate to return to the Emergency Department for any new, worsening or concerning symptoms.  ° ° °Flank Pain °Flank pain refers to pain that is located on the side of the body between the upper abdomen and the back. The pain may occur over a short period of time (acute) or may be long-term or reoccurring (chronic). It may be mild or severe. Flank pain can be caused by many things. °CAUSES  °Some of the more common causes of flank pain include: °· Muscle strains.   °· Muscle spasms.   °· A disease of your spine (vertebral disk disease).   °· A lung infection (pneumonia).   °· Fluid around your lungs (pulmonary edema).   °· A kidney infection.   °· Kidney stones.   °· A very painful skin rash caused by the chickenpox virus (shingles).   °· Gallbladder disease.   °HOME CARE INSTRUCTIONS  °Home care will depend on the cause of your pain. In general, °· Rest as directed by your caregiver. °· Drink enough fluids to keep your urine clear or pale yellow. °· Only take over-the-counter or prescription medicines as directed by your caregiver. Some medicines may help relieve the pain. °· Tell your caregiver about any changes in your pain. °· Follow up with your caregiver as directed. °SEEK IMMEDIATE MEDICAL CARE IF:  °· Your pain is not controlled with medicine.   °· You have new or worsening symptoms. °· Your pain increases.   °· You have abdominal pain.   °· You have shortness of breath.   °· You have persistent nausea or vomiting.   °· You have swelling in your abdomen.   °· You feel faint or pass out.   °· You have blood in your urine. °· You have a fever or persistent symptoms for more than 2-3 days. °· You have a fever and your symptoms suddenly get worse. °MAKE SURE YOU:  °· Understand these instructions. °· Will watch your condition. °· Will get help right away if you are not doing  well or get worse. °Document Released: 05/21/2005 Document Revised: 12/23/2011 Document Reviewed: 11/12/2011 °ExitCare® Patient Information ©2015 [employer]. This information is not intended to replace advice given to you by your health care provider. Make sure you discuss any questions you have with your health care provider. ° °

## 2014-12-23 NOTE — ED Notes (Signed)
Presents with left flank pain, has urology appoint next week, but due to pain, unable to see primary MD, states has hx of kidney stones

## 2014-12-23 NOTE — ED Provider Notes (Signed)
CSN: [id number]     Arrival date & time 12/23/14  1609 History   First MD Initiated Contact with Patient 12/23/14 1612     Chief Complaint  Patient presents with  . Back Pain     (Consider location/radiation/quality/duration/timing/severity/associated sxs/prior Treatment) HPI   Blood pressure 103/87, pulse 98, temperature 98.3 F (36.8 C), temperature source Oral, resp. rate 22, height  (1.727 m), weight 155 lb (70.308 kg), SpO2 99 %.  Gregory Flores is a 34 y.o. male complaining of  onset of left flank pain yesterday, severely worsened this morning. Patient took Motrin last night but none today. States pain is severe, consistent with prior kidney stones. Has history of frequent stone is normally able to pass them on his own presented to surgical intervention 5 lithotripsies. Patient denies fever, chills, dysuria, hematuria. Endorses single episode of nonbloody, nonbilious, non-coffee ground emesis and urinary urgency and frequency. She states he has an appointment with his urologist next week.   Urology:[facility] PCP:[facility]  Past Medical History  Diagnosis Date  . Renal disorder   . Kidney stone   . Kidney stones   . Bulging lumbar disc   . Arthritis   . Depression    Past Surgical History  Procedure Laterality Date  . Lithotripsy     No family history on file. Social History  Substance Use Topics  . Smoking status: Current Every Day Smoker -- 0.50 packs/day for 7 years    Types: Cigarettes  . Smokeless tobacco: Never Used  . Alcohol Use: Yes     Comment: rarely    Review of Systems  10 systems reviewed and found to be negative, except as noted in the HPI.  Allergies  Review of patient's allergies indicates no known allergies.  Home Medications   Prior to Admission medications   Medication Sig Start Date End Date Taking? Authorizing Provider  DULoxetine (CYMBALTA) 20 MG capsule Take 20 mg by mouth daily.    Historical Provider, MD  ibuprofen (ADVIL,MOTRIN) 800  MG tablet Take 1 tablet (800 mg total) by mouth 3 (three) times daily. 12/23/14   Lennard Capek, PA-C  oxyCODONE-acetaminophen (PERCOCET/ROXICET) 5-325 MG per tablet Take 1 tablet by mouth every 6 (six) hours as needed for severe pain. 12/06/14   Tilden Fossa, MD  tamsulosin (FLOMAX) 0.4 MG CAPS capsule Take 1 capsule (0.4 mg total) by mouth daily. 10/05/13   Gilda Crease, MD   BP 128/80 mmHg  Pulse 86  Temp(Src) 98.3 F (36.8 C) (Oral)  Resp 18  Ht  (1.727 m)  Wt 155 lb (70.308 kg)  BMI 23.57 kg/m2  SpO2 96% Physical Exam  Constitutional: He is oriented to person, place, and time. He appears well-developed and well-nourished. No distress.  writhing in the bed, appears acutely uncomfortable  HENT:  Head: Normocephalic.  Eyes: Conjunctivae and EOM are normal.  Cardiovascular: Normal rate, regular rhythm and intact distal pulses.   Pulmonary/Chest: Effort normal and breath sounds normal. No stridor.  Abdominal: Soft. Bowel sounds are normal.  Genitourinary:  Right and left CVA tenderness to percussion, left worse than right  Musculoskeletal: Normal range of motion.  Neurological: He is alert and oriented to person, place, and time.  Psychiatric: He has a normal mood and affect.  Nursing note and vitals reviewed.   ED Course  Procedures (including critical care time) Labs Review Labs Reviewed  URINALYSIS, ROUTINE W REFLEX MICROSCOPIC (NOT AT [facility])  CBC WITH DIFFERENTIAL/PLATELET  BASIC METABOLIC PANEL  Imaging Review US Renal  12/23/2014   CLINICAL DATA:  Bilateral flank pain. Worsening symptoms for the last 12 hours. Known kidney stones. LEFT flank pain.  EXAM: RENAL / URINARY TRACT ULTRASOUND COMPLETE  COMPARISON:  12/06/2014.  FINDINGS: Right Kidney:  Length: 10.5 cm. There is no hydronephrosis. Interpolar and lower polar renal calculi are present. The interpolar calculus measures 6 mm. Lower pole calculus measures 8 mm.  Left Kidney:  Length: 11.1 cm.  Inferior pole calculus is present measuring 6 mm long axis. No hydronephrosis.  Bladder:  Bilateral ureteral jets.  IMPRESSION: Bilateral nephrolithiasis.  No hydronephrosis.   Electronically Signed   By: Andreas Newport M.D.   On: 12/23/2014 17:44   I have personally reviewed and evaluated these images and lab results as part of my medical decision-making.   EKG Interpretation None       MDM   Final diagnoses:  Left flank pain   Filed Vitals:   12/23/14 1615 12/23/14 1835  BP: 103/87 128/80  Pulse: 98 86  Temp: 98.3 F (36.8 C)   TempSrc: Oral   Resp: 22 18  Height: 5\' 8"  (1.727 m)   Weight: 155 lb (70.308 kg)   SpO2: 99% 96%    Medications  ondansetron (ZOFRAN) injection 4 mg (4 mg Intravenous Given 12/23/14 1641)  sodium chloride 0.9 % bolus 1,000 mL (0 mLs Intravenous Stopped 12/23/14 1911)  oxyCODONE-acetaminophen (PERCOCET/ROXICET) 5-325 MG per tablet 1 tablet (1 tablet Oral Given 12/23/14 1642)  HYDROcodone-acetaminophen (NORCO/VICODIN) 5-325 MG per tablet 1 tablet (1 tablet Oral Given 12/23/14 1916)  ketorolac (TORADOL) 30 MG/ML injection 30 mg (30 mg Intravenous Given 12/23/14 1914)    Gregory Flores is a pleasant 34 y.o. male presenting with presenting with renal colic.  Abdominal exam is benign. Patient has history of kidney stones that have required intervention to pass in the past. He is also reporting urgency and frequency. Patient has bilateral CVA tenderness to percussion. Urinalysis with no abnormality, blood work with no abnormality. Patient has a history of multiple and frequent stone CTs in the past, the majority of them are without abnormality. I do not think a CT is in this patient's best interest at this time. Ultrasound shows no hydronephrosis. There are intrarenal calculi. Patient is given narcotics in the ED but recommend NSAIDs for pain relief at home and close follow-up with urology.   Evaluation does not show pathology that would require ongoing emergent  intervention or inpatient treatment. Pt is hemodynamically stable and mentating appropriately. Discussed findings and plan with patient/guardian, who agrees with care plan. All questions answered. Return precautions discussed and outpatient follow up given.   Discharge Medication List as of 12/23/2014  7:06 PM    START taking these medications   Details  ibuprofen (ADVIL,MOTRIN) 800 MG tablet Take 1 tablet (800 mg total) by mouth 3 (three) times daily., Starting 12/23/2014, Until Discontinued, Print             Wynetta Emery, PA-C 12/23/14 2034  Vanetta Mulders, MD 12/26/14 1650

## 2014-12-23 NOTE — ED Notes (Signed)
MD at bedside. 

## 2014-12-23 NOTE — ED Notes (Signed)
Also c/o N/V and urgency and frequency

## 2014-12-23 NOTE — ED Notes (Signed)
Pt has hx of kidney stones, presents today with left flank pain, grimmacing, moaning, unable to lay still, placed on stretcher, assisted to position of comfort.

## 2015-01-28 ENCOUNTER — Emergency Department (HOSPITAL_BASED_OUTPATIENT_CLINIC_OR_DEPARTMENT_OTHER): Payer: Managed Care, Other (non HMO)

## 2015-01-28 ENCOUNTER — Encounter (HOSPITAL_BASED_OUTPATIENT_CLINIC_OR_DEPARTMENT_OTHER): Payer: Self-pay

## 2015-01-28 ENCOUNTER — Emergency Department (HOSPITAL_BASED_OUTPATIENT_CLINIC_OR_DEPARTMENT_OTHER)
Admission: EM | Admit: 2015-01-28 | Discharge: 2015-01-28 | Disposition: A | Discharge status: Home / Self Care | Payer: Managed Care, Other (non HMO) | Attending: Emergency Medicine | Admitting: Emergency Medicine

## 2015-01-28 DIAGNOSIS — Z8739 Personal history of other diseases of the musculoskeletal system and connective tissue: Secondary | ICD-10-CM | POA: Insufficient documentation

## 2015-01-28 DIAGNOSIS — Z87442 Personal history of urinary calculi: Secondary | ICD-10-CM | POA: Insufficient documentation

## 2015-01-28 DIAGNOSIS — Z720 Tobacco use: Secondary | ICD-10-CM | POA: Insufficient documentation

## 2015-01-28 DIAGNOSIS — Z87448 Personal history of other diseases of urinary system: Secondary | ICD-10-CM | POA: Insufficient documentation

## 2015-01-28 DIAGNOSIS — R109 Unspecified abdominal pain: Secondary | ICD-10-CM

## 2015-01-28 DIAGNOSIS — Z8659 Personal history of other mental and behavioral disorders: Secondary | ICD-10-CM | POA: Insufficient documentation

## 2015-01-28 LAB — URINALYSIS, ROUTINE W REFLEX MICROSCOPIC
Bilirubin Urine: NEGATIVE
Glucose, UA: NEGATIVE mg/dL
Hgb urine dipstick: NEGATIVE
Ketones, ur: 15 mg/dL — AB
LEUKOCYTES UA: NEGATIVE
Nitrite: NEGATIVE
PROTEIN: NEGATIVE mg/dL
SPECIFIC GRAVITY, URINE: 1.012 (ref 1.005–1.030)
Urobilinogen, UA: 1 mg/dL (ref 0.0–1.0)
pH: 7 (ref 5.0–8.0)

## 2015-01-28 LAB — DIFFERENTIAL
BASOS ABS: 0 10*3/uL (ref 0.0–0.1)
Basophils Relative: 0 %
Eosinophils Absolute: 0.1 10*3/uL (ref 0.0–0.7)
Eosinophils Relative: 1 %
LYMPHS ABS: 1.9 10*3/uL (ref 0.7–4.0)
LYMPHS PCT: 26 %
MONOS PCT: 11 %
Monocytes Absolute: 0.8 10*3/uL (ref 0.1–1.0)
NEUTROS ABS: 4.7 10*3/uL (ref 1.7–7.7)
NEUTROS PCT: 62 %

## 2015-01-28 LAB — BASIC METABOLIC PANEL
ANION GAP: 7 (ref 5–15)
BUN: 10 mg/dL (ref 6–20)
CALCIUM: 9.3 mg/dL (ref 8.9–10.3)
CO2: 26 mmol/L (ref 22–32)
Chloride: 107 mmol/L (ref 101–111)
Creatinine, Ser: 0.99 mg/dL (ref 0.61–1.24)
GLUCOSE: 81 mg/dL (ref 65–99)
POTASSIUM: 4.1 mmol/L (ref 3.5–5.1)
Sodium: 140 mmol/L (ref 135–145)

## 2015-01-28 LAB — CBC
HEMATOCRIT: 45.3 % (ref 39.0–52.0)
HEMOGLOBIN: 15.8 g/dL (ref 13.0–17.0)
MCH: 31.6 pg (ref 26.0–34.0)
MCHC: 34.9 g/dL (ref 30.0–36.0)
MCV: 90.6 fL (ref 78.0–100.0)
Platelets: 286 10*3/uL (ref 150–400)
RBC: 5 MIL/uL (ref 4.22–5.81)
RDW: 12.3 % (ref 11.5–15.5)
WBC: 7.5 10*3/uL (ref 4.0–10.5)

## 2015-01-28 MED ORDER — SODIUM CHLORIDE 0.9 % IV BOLUS (SEPSIS)
1000.0000 mL | Freq: Once | INTRAVENOUS | Status: AC
  Administered 2015-01-28: 1000 mL via INTRAVENOUS

## 2015-01-28 MED ORDER — KETOROLAC TROMETHAMINE 30 MG/ML IJ SOLN
30.0000 mg | Freq: Once | INTRAMUSCULAR | Status: AC
  Administered 2015-01-28: 30 mg via INTRAVENOUS
  Filled 2015-01-28: qty 1

## 2015-01-28 MED ORDER — PROMETHAZINE HCL 25 MG PO TABS: 25.0000 mg | ORAL_TABLET | Freq: Three times a day (TID) | ORAL | Status: DC | PRN

## 2015-01-28 MED ORDER — HYDROMORPHONE HCL 1 MG/ML IJ SOLN
1.0000 mg | Freq: Once | INTRAMUSCULAR | Status: AC
  Administered 2015-01-28: 1 mg via INTRAVENOUS
  Filled 2015-01-28: qty 1

## 2015-01-28 MED ORDER — ONDANSETRON HCL 4 MG/2ML IJ SOLN
4.0000 mg | Freq: Once | INTRAMUSCULAR | Status: AC
  Administered 2015-01-28: 4 mg via INTRAVENOUS
  Filled 2015-01-28: qty 2

## 2015-01-28 MED ORDER — HYDROCODONE-ACETAMINOPHEN 5-325 MG PO TABS: 1.0000 | ORAL_TABLET | Freq: Four times a day (QID) | ORAL | Status: DC | PRN

## 2015-01-28 NOTE — ED Provider Notes (Signed)
CSN: [id number]     Arrival date & time 01/28/15  1350 History   First MD Initiated Contact with Patient 01/28/15 1602     Chief Complaint  Patient presents with  . Flank Pain     (Consider location/radiation/quality/duration/timing/severity/associated sxs/prior Treatment) HPI Patient presents to the emergency department with right-sided flank pain for the last 3 days.  Patient states that he feels like he has had blindness, urine or trouble urinating.  Patient states that he has had kidney stones in the past.  He denies chest pain, shortness of breath, fever, incontinence, hematemesis, bloody stool, near syncope, rash, dizziness, headache, blurred vision, back pain, neck pain or syncope.  The patient states nothing seems make his condition, better or worse.  Patient states that he tried over-the-counter treatments without relief of his symptoms Past Medical History  Diagnosis Date  . Renal disorder   . Kidney stone   . Kidney stones   . Bulging lumbar disc   . Arthritis   . Depression    Past Surgical History  Procedure Laterality Date  . Lithotripsy     No family history on file. Social History  Substance Use Topics  . Smoking status: Current Every Day Smoker -- 0.50 packs/day for 7 years    Types: Cigarettes  . Smokeless tobacco: Never Used  . Alcohol Use: Yes     Comment: rarely    Review of Systems  All other systems negative except as documented in the HPI. All pertinent positives and negatives as reviewed in the HPI.  Allergies  Review of patient's allergies indicates no known allergies.  Home Medications   Prior to Admission medications   Medication Sig Start Date End Date Taking? Authorizing Provider  GABAPENTIN PO Take by mouth.   Yes Historical Provider, MD   BP 127/71 mmHg  Pulse 71  Temp(Src) 98.7 F (37.1 C) (Oral)  Resp 18  Ht  (1.702 m)  Wt 150 lb (68.04 kg)  BMI 23.49 kg/m2  SpO2 98% Physical Exam  Constitutional: He is oriented to  person, place, and time. He appears well-developed and well-nourished. No distress.  HENT:  Head: Normocephalic and atraumatic.  Mouth/Throat: Oropharynx is clear and moist.  Eyes: Pupils are equal, round, and reactive to light.  Neck: Normal range of motion. Neck supple.  Cardiovascular: Normal rate, regular rhythm and normal heart sounds.  Exam reveals no gallop and no friction rub.   No murmur heard. Pulmonary/Chest: Effort normal and breath sounds normal. No respiratory distress. He has no wheezes.  Abdominal: Soft. Bowel sounds are normal. He exhibits no distension. There is tenderness. There is no rebound and no guarding.    Neurological: He is alert and oriented to person, place, and time. He exhibits normal muscle tone. Coordination normal.  Skin: Skin is warm and dry. No rash noted. No erythema.  Psychiatric: He has a normal mood and affect. His behavior is normal.  Nursing note and vitals reviewed.   ED Course  Procedures (including critical care time) Labs Review Labs Reviewed  URINALYSIS, ROUTINE W REFLEX MICROSCOPIC (NOT AT [facility]) - Abnormal; Notable for the following:    Ketones, ur 15 (*)    All other components within normal limits  BASIC METABOLIC PANEL  CBC  DIFFERENTIAL    Imaging Review Ct Renal Stone Study  01/28/2015  CLINICAL DATA:  Bilateral flank pain, left greater than right with hematuria and nausea for 3 days EXAM: CT ABDOMEN AND PELVIS WITHOUT CONTRAST TECHNIQUE: Multidetector CT  imaging of the abdomen and pelvis was performed following the standard protocol without IV contrast. COMPARISON:  06/26/2014 FINDINGS: Lower chest and abdominal wall:  No contributory findings. Hepatobiliary: No focal liver abnormality.No evidence of biliary obstruction or stone. Pancreas: Unremarkable. Spleen: Unremarkable. Adrenals/Urinary Tract:  Negative adrenals. At least 3 bilateral renal calculi with no ureteral calculus or hydronephrosis. Stones are small, measuring no  more than 3 mm. Unremarkable bladder. Reproductive:No pathologic findings. Stomach/Bowel:  No obstruction. No appendicitis. Vascular/Lymphatic: No acute vascular abnormality. No mass or adenopathy. Peritoneal: No ascites or pneumoperitoneum. Musculoskeletal: Remote L4 left transverse process fracture. No osseous findings to explain acute pain. IMPRESSION: 1. No acute finding, including hydronephrosis or ureteral calculus. 2. Bilateral nephrolithiasis. Electronically Signed   By: Marnee SpringJonathon  Watts M.D.   On: 01/28/2015 17:00   I have personally reviewed and evaluated these images and lab results as part of my medical decision-making.  He will be treated present.  She flank pain.  Told to return here as needed.  Patient agrees the plan and all questions were answered.  I am given him follow-up with urology  Charlestine Nighthristopher Emberlie Gotcher, PA-C 02/01/15 1702  Dione Boozeavid Glick, MD 02/01/15 [phone]

## 2015-01-28 NOTE — Discharge Instructions (Signed)
Return here as needed.  Follow up with the urologist provided.  Increase your fluid intake °

## 2015-01-28 NOTE — ED Notes (Signed)
C/o bilat flank pain x 3 days-hematuria and dysuria

## 2015-02-26 ENCOUNTER — Emergency Department (HOSPITAL_BASED_OUTPATIENT_CLINIC_OR_DEPARTMENT_OTHER)
Admission: EM | Admit: 2015-02-26 | Discharge: 2015-02-26 | Disposition: A | Discharge status: Home / Self Care | Payer: Self-pay | Attending: Emergency Medicine | Admitting: Emergency Medicine

## 2015-02-26 ENCOUNTER — Emergency Department (HOSPITAL_BASED_OUTPATIENT_CLINIC_OR_DEPARTMENT_OTHER): Payer: Self-pay

## 2015-02-26 ENCOUNTER — Emergency Department (HOSPITAL_BASED_OUTPATIENT_CLINIC_OR_DEPARTMENT_OTHER): Payer: Managed Care, Other (non HMO)

## 2015-02-26 ENCOUNTER — Encounter (HOSPITAL_BASED_OUTPATIENT_CLINIC_OR_DEPARTMENT_OTHER): Payer: Self-pay | Admitting: Emergency Medicine

## 2015-02-26 DIAGNOSIS — R197 Diarrhea, unspecified: Secondary | ICD-10-CM | POA: Insufficient documentation

## 2015-02-26 DIAGNOSIS — R109 Unspecified abdominal pain: Secondary | ICD-10-CM

## 2015-02-26 DIAGNOSIS — Z87448 Personal history of other diseases of urinary system: Secondary | ICD-10-CM | POA: Insufficient documentation

## 2015-02-26 DIAGNOSIS — R110 Nausea: Secondary | ICD-10-CM | POA: Insufficient documentation

## 2015-02-26 DIAGNOSIS — F17210 Nicotine dependence, cigarettes, uncomplicated: Secondary | ICD-10-CM | POA: Insufficient documentation

## 2015-02-26 DIAGNOSIS — Z8659 Personal history of other mental and behavioral disorders: Secondary | ICD-10-CM | POA: Insufficient documentation

## 2015-02-26 DIAGNOSIS — M1990 Unspecified osteoarthritis, unspecified site: Secondary | ICD-10-CM | POA: Insufficient documentation

## 2015-02-26 DIAGNOSIS — R1011 Right upper quadrant pain: Secondary | ICD-10-CM | POA: Insufficient documentation

## 2015-02-26 DIAGNOSIS — R300 Dysuria: Secondary | ICD-10-CM | POA: Insufficient documentation

## 2015-02-26 DIAGNOSIS — Z87442 Personal history of urinary calculi: Secondary | ICD-10-CM | POA: Insufficient documentation

## 2015-02-26 LAB — CBC WITH DIFFERENTIAL/PLATELET
Basophils Absolute: 0 10*3/uL (ref 0.0–0.1)
Basophils Relative: 0 %
EOS ABS: 0.1 10*3/uL (ref 0.0–0.7)
EOS PCT: 1 %
HCT: 41 % (ref 39.0–52.0)
Hemoglobin: 14.2 g/dL (ref 13.0–17.0)
LYMPHS ABS: 1.5 10*3/uL (ref 0.7–4.0)
Lymphocytes Relative: 25 %
MCH: 31.6 pg (ref 26.0–34.0)
MCHC: 34.6 g/dL (ref 30.0–36.0)
MCV: 91.3 fL (ref 78.0–100.0)
MONO ABS: 0.5 10*3/uL (ref 0.1–1.0)
MONOS PCT: 8 %
Neutro Abs: 4.2 10*3/uL (ref 1.7–7.7)
Neutrophils Relative %: 66 %
Platelets: 240 10*3/uL (ref 150–400)
RBC: 4.49 MIL/uL (ref 4.22–5.81)
RDW: 12.2 % (ref 11.5–15.5)
WBC: 6.3 10*3/uL (ref 4.0–10.5)

## 2015-02-26 LAB — COMPREHENSIVE METABOLIC PANEL
ALT: 10 U/L — ABNORMAL LOW (ref 17–63)
ANION GAP: 7 (ref 5–15)
AST: 13 U/L — ABNORMAL LOW (ref 15–41)
Albumin: 4.4 g/dL (ref 3.5–5.0)
Alkaline Phosphatase: 60 U/L (ref 38–126)
BUN: 16 mg/dL (ref 6–20)
CALCIUM: 9 mg/dL (ref 8.9–10.3)
CHLORIDE: 108 mmol/L (ref 101–111)
CO2: 25 mmol/L (ref 22–32)
Creatinine, Ser: 0.93 mg/dL (ref 0.61–1.24)
GFR calc non Af Amer: >60 mL/min (ref >60)
Glucose, Bld: 88 mg/dL (ref 65–99)
Potassium: 3.7 mmol/L (ref 3.5–5.1)
SODIUM: 140 mmol/L (ref 135–145)
Total Bilirubin: 0.6 mg/dL (ref 0.3–1.2)
Total Protein: 6.9 g/dL (ref 6.5–8.1)

## 2015-02-26 LAB — URINE MICROSCOPIC-ADD ON: WBC UA: NONE SEEN WBC/hpf (ref 0–5)

## 2015-02-26 LAB — URINALYSIS, ROUTINE W REFLEX MICROSCOPIC
Bilirubin Urine: NEGATIVE
Glucose, UA: NEGATIVE mg/dL
KETONES UR: 15 mg/dL — AB
LEUKOCYTES UA: NEGATIVE
NITRITE: NEGATIVE
PH: 6.5 (ref 5.0–8.0)
Protein, ur: NEGATIVE mg/dL
Specific Gravity, Urine: 1.036 — ABNORMAL HIGH (ref 1.005–1.030)

## 2015-02-26 LAB — LIPASE, BLOOD: LIPASE: 25 U/L (ref 11–51)

## 2015-02-26 MED ORDER — IOHEXOL 300 MG/ML  SOLN
100.0000 mL | Freq: Once | INTRAMUSCULAR | Status: AC | PRN
  Administered 2015-02-26: 100 mL via INTRAVENOUS

## 2015-02-26 MED ORDER — ONDANSETRON HCL 4 MG/2ML IJ SOLN
4.0000 mg | Freq: Once | INTRAMUSCULAR | Status: AC
  Administered 2015-02-26: 4 mg via INTRAVENOUS
  Filled 2015-02-26: qty 2

## 2015-02-26 MED ORDER — MORPHINE SULFATE (PF) 4 MG/ML IV SOLN
4.0000 mg | Freq: Once | INTRAVENOUS | Status: AC
  Administered 2015-02-26: 4 mg via INTRAVENOUS
  Filled 2015-02-26: qty 1

## 2015-02-26 MED ORDER — KETOROLAC TROMETHAMINE 30 MG/ML IJ SOLN
30.0000 mg | Freq: Once | INTRAMUSCULAR | Status: AC
Start: 2015-02-26 — End: 2015-02-26
  Administered 2015-02-26: 30 mg via INTRAVENOUS
  Filled 2015-02-26: qty 1

## 2015-02-26 MED ORDER — ACETAMINOPHEN 500 MG PO TABS: 500.0000 mg | ORAL_TABLET | Freq: Four times a day (QID) | ORAL | Status: DC | PRN

## 2015-02-26 MED ORDER — IOHEXOL 300 MG/ML  SOLN
25.0000 mL | Freq: Once | INTRAMUSCULAR | Status: AC | PRN
  Administered 2015-02-26: 25 mL via ORAL

## 2015-02-26 MED ORDER — IBUPROFEN 800 MG PO TABS: 800.0000 mg | ORAL_TABLET | Freq: Three times a day (TID) | ORAL | Status: DC

## 2015-02-26 MED ORDER — SODIUM CHLORIDE 0.9 % IV SOLN
Freq: Once | INTRAVENOUS | Status: AC
  Administered 2015-02-26: 18:00:00 via INTRAVENOUS

## 2015-02-26 NOTE — ED Notes (Signed)
Pt returned from Ultrasound at this time.

## 2015-02-26 NOTE — ED Provider Notes (Signed)
CSN: [id number]     Arrival date & time 02/26/15  1651 History   First MD Initiated Contact with Patient 02/26/15 1725     Chief Complaint  Patient presents with  . Abdominal Pain     (Consider location/radiation/quality/duration/timing/severity/associated sxs/prior Treatment) HPI   Gregory Flores is a 34 y.o. male with PMH significant for kidney stones, bulging lumbar disc, arthritis, depression who presents with gradually worsening sharp stabbing non-radiating right sided abdominal pain for the past week; however, it got "really bad" yesterday.  It's made worse after eating, and he reports he experiences a sharp stabbing pain in his right upper abdomen.  Tried tylenol, which has not helped.  He states it's 8/10.  Assoc symptoms include nausea, dysuria, and diarrhea.  Denies fevers, chills, vomiting, hematuria, increased urinary frequency, bloody stools, testicular pain or swelling.  No history of abdominal surgeries.  Endorses adequate PO intake.  Nothing like this has happened before.  He states this does not feel like his kidney stones in the past.    Past Medical History  Diagnosis Date  . Renal disorder   . Kidney stone   . Kidney stones   . Bulging lumbar disc   . Arthritis   . Depression    Past Surgical History  Procedure Laterality Date  . Lithotripsy     History reviewed. No pertinent family history. Social History  Substance Use Topics  . Smoking status: Current Every Day Smoker -- 0.50 packs/day for 7 years    Types: Cigarettes  . Smokeless tobacco: Never Used  . Alcohol Use: Yes     Comment: rarely    Review of Systems  All other systems negative unless otherwise stated in HPI   Allergies  Review of patient's allergies indicates no known allergies.  Home Medications   Prior to Admission medications   Medication Sig Start Date End Date Taking? Authorizing Provider  acetaminophen (TYLENOL) 500 MG tablet Take 1 tablet (500 mg total) by mouth every 6 (six)  hours as needed. 02/26/15   Cheri Fowler, PA-C  GABAPENTIN PO Take by mouth.    Historical Provider, MD  HYDROcodone-acetaminophen (NORCO/VICODIN) 5-325 MG tablet Take 1 tablet by mouth every 6 (six) hours as needed for moderate pain. 01/28/15   Charlestine Night, PA-C  ibuprofen (ADVIL,MOTRIN) 800 MG tablet Take 1 tablet (800 mg total) by mouth 3 (three) times daily. 02/26/15   Cheri Fowler, PA-C  promethazine (PHENERGAN) 25 MG tablet Take 1 tablet (25 mg total) by mouth every 8 (eight) hours as needed for nausea or vomiting. 01/28/15   Christopher Lawyer, PA-C   BP 113/74 mmHg  Pulse 70  Temp(Src) 98.4 F (36.9 C) (Oral)  Resp 18  Ht  (1.727 m)  Wt 150 lb (68.04 kg)  BMI 22.81 kg/m2  SpO2 98% Physical Exam  Constitutional: He is oriented to person, place, and time. He appears well-developed and well-nourished.  HENT:  Head: Normocephalic and atraumatic.  Mouth/Throat: Oropharynx is clear and moist.  Eyes: Conjunctivae are normal. Pupils are equal, round, and reactive to light.  Neck: Normal range of motion. Neck supple.  Cardiovascular: Normal rate, regular rhythm and normal heart sounds.   No murmur heard. Pulmonary/Chest: Effort normal and breath sounds normal. No accessory muscle usage or stridor. No respiratory distress. He has no wheezes. He has no rhonchi. He has no rales.  Abdominal: Soft. Bowel sounds are normal. He exhibits no distension. There is tenderness in the right upper quadrant. There is no rigidity,  no rebound, no guarding and negative Murphy's sign.    Musculoskeletal: Normal range of motion.  Lymphadenopathy:    He has no cervical adenopathy.  Neurological: He is alert and oriented to person, place, and time.  Speech clear without dysarthria.  Skin: Skin is warm and dry.  Psychiatric: He has a normal mood and affect. His behavior is normal.    ED Course  Procedures (including critical care time) Labs Review Labs Reviewed  COMPREHENSIVE METABOLIC PANEL  - Abnormal; Notable for the following:    AST 13 (*)    ALT 10 (*)    All other components within normal limits  URINALYSIS, ROUTINE W REFLEX MICROSCOPIC (NOT AT [facility]) - Abnormal; Notable for the following:    Specific Gravity, Urine 1.036 (*)    Hgb urine dipstick SMALL (*)    Ketones, ur 15 (*)    All other components within normal limits  URINE MICROSCOPIC-ADD ON - Abnormal; Notable for the following:    Squamous Epithelial / LPF 0-5 (*)    Bacteria, UA RARE (*)    All other components within normal limits  LIPASE, BLOOD  CBC WITH DIFFERENTIAL/PLATELET    Imaging Review US Abdomen Complete  02/26/2015  CLINICAL DATA:  Right-sided abdominal pain for 1 week increasing in intensity over the last 2 days. Bilateral renal calculi. EXAM: ULTRASOUND ABDOMEN COMPLETE COMPARISON:  01/28/2015 FINDINGS: Gallbladder: No gallbladder wall thickening. Slightly contracted gallbladder. Sonographic Murphy sign indeterminate due to pain medication. No gallstones seen. Common bile duct: Diameter: 1 mm Liver: No focal lesion identified. Within normal limits in parenchymal echogenicity. IVC: No abnormality visualized. Pancreas: Visualized portion unremarkable. Pancreatic head not well seen due to overlying bowel gas. Spleen: Size and appearance within normal limits. Right Kidney: Length: 9.8 cm. Echogenicity within normal limits. No mass or hydronephrosis visualized. Several tiny calculi including a 3 mm lower pole calculus. Left Kidney: Length: 11.2 cm. Echogenicity within normal limits. No mass or hydronephrosis visualized. Multiple small calculi including a 2-3 mm lower pole calculus. Abdominal aorta: No aneurysm visualized. Other findings: None. IMPRESSION: 1. Small nonobstructive bilateral renal calculi. 2. No gallstones seen. No biliary dilatation. Sonographic Murphy sign indeterminate due to pain medication. 3. Otherwise negative exam. Pancreatic head not well seen due to overlying bowel gas. Electronically  Signed   By: Gaylyn Rong M.D.   On: 02/26/2015 19:44   Ct Abdomen Pelvis W Contrast  02/26/2015  CLINICAL DATA:  Acute onset of right lower quadrant abdominal pain and nausea. Initial encounter. EXAM: CT ABDOMEN AND PELVIS WITH CONTRAST TECHNIQUE: Multidetector CT imaging of the abdomen and pelvis was performed using the standard protocol following bolus administration of intravenous contrast. CONTRAST:  OMNIPAQUE IOHEXOL 300 MG/ML  SOLN COMPARISON:  CT of the abdomen and pelvis performed 01/28/2015, and abdominal ultrasound performed earlier today at 6:30 p.m. FINDINGS: The visualized lung bases are clear. The liver and spleen are unremarkable in appearance. The gallbladder is within normal limits. The pancreas and adrenal glands are unremarkable. A few small nonobstructing right renal stones are seen, measuring up to 4 mm in size. There is no evidence of hydronephrosis. No obstructing ureteral stones are seen. No perinephric stranding is appreciated. No free fluid is identified. The small bowel is unremarkable in appearance. The stomach is within normal limits. No acute vascular abnormalities are seen. The appendix is normal in caliber and contains air, without evidence of appendicitis. The colon is unremarkable in appearance. The bladder is mildly distended and grossly unremarkable. The  prostate remains normal in size. No inguinal lymphadenopathy is seen. No acute osseous abnormalities are identified. IMPRESSION: 1. No acute abnormality seen within the abdomen or pelvis. 2. Few small nonobstructing right renal stones, measuring up to 4 mm in size. Electronically Signed   By: Roanna RaiderJeffery  Chang M.D.   On: 02/26/2015 21:40   I have personally reviewed and evaluated these images and lab results as part of my medical decision-making.   EKG Interpretation None      MDM   Final diagnoses:  Abdominal pain, unspecified abdominal location   Patient presents with right sided abdominal pain x 1  week that became worse yesterday.  Associated with eating.  No fevers.  VSS, NAD.  On exam, heart RRR, lungs CTAB, abdomen soft and exhibits tenderness in RUQ.  No rebound or guarding.    Will obtain labs and abdominal ultrasound. Labs unremarkable.  Abdominal ultrasound and CT unremarkable.  Doubt testicular torsion.  Doubt appendicitis.  Doubt nephrolithiasis. Doubt cholelithiasis or cholangitis.  Evaluation does not show pathology requring ongoing emergent intervention or admission. Pt is hemodynamically stable and mentating appropriately. Discussed findings/results and plan with patient/guardian, who agrees with plan. All questions answered. Return precautions discussed and outpatient follow up given.   Case has been discussed with and seen by Dr. Dalene SeltzerSchlossman who agrees with the above plan for discharge.   Cheri FowlerKayla Arpi Diebold, PA-C 02/26/15 2212  Alvira MondayErin Schlossman, MD 02/27/15 2118

## 2015-02-26 NOTE — Discharge Instructions (Signed)

## 2015-02-26 NOTE — ED Notes (Signed)
Patient transported to Ultrasound 

## 2015-02-26 NOTE — ED Notes (Signed)
34 yom RLQ abdominal pain x 1 week becoming more constant. +nausea

## 2015-03-26 ENCOUNTER — Encounter (HOSPITAL_BASED_OUTPATIENT_CLINIC_OR_DEPARTMENT_OTHER): Payer: Self-pay | Admitting: Emergency Medicine

## 2015-03-26 ENCOUNTER — Emergency Department (HOSPITAL_BASED_OUTPATIENT_CLINIC_OR_DEPARTMENT_OTHER): Payer: Managed Care, Other (non HMO)

## 2015-03-26 ENCOUNTER — Emergency Department (HOSPITAL_BASED_OUTPATIENT_CLINIC_OR_DEPARTMENT_OTHER)
Admission: EM | Admit: 2015-03-26 | Discharge: 2015-03-26 | Disposition: A | Discharge status: Home / Self Care | Payer: Managed Care, Other (non HMO) | Attending: Emergency Medicine | Admitting: Emergency Medicine

## 2015-03-26 DIAGNOSIS — R319 Hematuria, unspecified: Secondary | ICD-10-CM | POA: Insufficient documentation

## 2015-03-26 DIAGNOSIS — Z791 Long term (current) use of non-steroidal anti-inflammatories (NSAID): Secondary | ICD-10-CM | POA: Insufficient documentation

## 2015-03-26 DIAGNOSIS — N50812 Left testicular pain: Secondary | ICD-10-CM | POA: Insufficient documentation

## 2015-03-26 DIAGNOSIS — R109 Unspecified abdominal pain: Secondary | ICD-10-CM

## 2015-03-26 DIAGNOSIS — R51 Headache: Secondary | ICD-10-CM | POA: Insufficient documentation

## 2015-03-26 DIAGNOSIS — Z87442 Personal history of urinary calculi: Secondary | ICD-10-CM | POA: Insufficient documentation

## 2015-03-26 DIAGNOSIS — R300 Dysuria: Secondary | ICD-10-CM | POA: Insufficient documentation

## 2015-03-26 DIAGNOSIS — R1032 Left lower quadrant pain: Secondary | ICD-10-CM | POA: Insufficient documentation

## 2015-03-26 DIAGNOSIS — F17210 Nicotine dependence, cigarettes, uncomplicated: Secondary | ICD-10-CM | POA: Insufficient documentation

## 2015-03-26 DIAGNOSIS — M1990 Unspecified osteoarthritis, unspecified site: Secondary | ICD-10-CM | POA: Insufficient documentation

## 2015-03-26 DIAGNOSIS — F329 Major depressive disorder, single episode, unspecified: Secondary | ICD-10-CM | POA: Insufficient documentation

## 2015-03-26 LAB — CBC WITH DIFFERENTIAL/PLATELET
BASOS ABS: 0 10*3/uL (ref 0.0–0.1)
Basophils Relative: 0 %
Eosinophils Absolute: 0.1 10*3/uL (ref 0.0–0.7)
Eosinophils Relative: 1 %
HEMATOCRIT: 42.8 % (ref 39.0–52.0)
Hemoglobin: 14.5 g/dL (ref 13.0–17.0)
LYMPHS ABS: 1.9 10*3/uL (ref 0.7–4.0)
LYMPHS PCT: 32 %
MCH: 31.3 pg (ref 26.0–34.0)
MCHC: 33.9 g/dL (ref 30.0–36.0)
MCV: 92.4 fL (ref 78.0–100.0)
MONO ABS: 0.6 10*3/uL (ref 0.1–1.0)
Monocytes Relative: 10 %
NEUTROS ABS: 3.4 10*3/uL (ref 1.7–7.7)
Neutrophils Relative %: 57 %
Platelets: 260 10*3/uL (ref 150–400)
RBC: 4.63 MIL/uL (ref 4.22–5.81)
RDW: 12.7 % (ref 11.5–15.5)
WBC: 6.1 10*3/uL (ref 4.0–10.5)

## 2015-03-26 LAB — URINALYSIS, ROUTINE W REFLEX MICROSCOPIC
Bilirubin Urine: NEGATIVE
GLUCOSE, UA: NEGATIVE mg/dL
HGB URINE DIPSTICK: NEGATIVE
Ketones, ur: NEGATIVE mg/dL
Leukocytes, UA: NEGATIVE
Nitrite: NEGATIVE
PROTEIN: NEGATIVE mg/dL
Specific Gravity, Urine: 1.018 (ref 1.005–1.030)
pH: 7.5 (ref 5.0–8.0)

## 2015-03-26 LAB — COMPREHENSIVE METABOLIC PANEL
ALBUMIN: 4.6 g/dL (ref 3.5–5.0)
ALK PHOS: 62 U/L (ref 38–126)
ALT: 12 U/L — AB (ref 17–63)
AST: 16 U/L (ref 15–41)
Anion gap: 6 (ref 5–15)
BILIRUBIN TOTAL: 0.7 mg/dL (ref 0.3–1.2)
BUN: 15 mg/dL (ref 6–20)
CO2: 28 mmol/L (ref 22–32)
CREATININE: 0.86 mg/dL (ref 0.61–1.24)
Calcium: 9.3 mg/dL (ref 8.9–10.3)
Chloride: 105 mmol/L (ref 101–111)
GFR calc Af Amer: >60 mL/min (ref >60)
GFR calc non Af Amer: >60 mL/min (ref >60)
GLUCOSE: 102 mg/dL — AB (ref 65–99)
POTASSIUM: 4 mmol/L (ref 3.5–5.1)
Sodium: 139 mmol/L (ref 135–145)
TOTAL PROTEIN: 7.1 g/dL (ref 6.5–8.1)

## 2015-03-26 MED ORDER — HYDROMORPHONE HCL 1 MG/ML IJ SOLN
1.0000 mg | Freq: Once | INTRAMUSCULAR | Status: AC
  Administered 2015-03-26: 1 mg via INTRAVENOUS
  Filled 2015-03-26: qty 1

## 2015-03-26 MED ORDER — ONDANSETRON HCL 4 MG/2ML IJ SOLN
4.0000 mg | Freq: Once | INTRAMUSCULAR | Status: AC
  Administered 2015-03-26: 4 mg via INTRAVENOUS
  Filled 2015-03-26: qty 2

## 2015-03-26 MED ORDER — SODIUM CHLORIDE 0.9 % IV BOLUS (SEPSIS)
1000.0000 mL | Freq: Once | INTRAVENOUS | Status: AC
  Administered 2015-03-26: 1000 mL via INTRAVENOUS

## 2015-03-26 MED ORDER — NAPROXEN 500 MG PO TABS: 500.0000 mg | ORAL_TABLET | Freq: Two times a day (BID) | ORAL | Status: DC

## 2015-03-26 MED ORDER — KETOROLAC TROMETHAMINE 15 MG/ML IJ SOLN
30.0000 mg | Freq: Once | INTRAMUSCULAR | Status: AC
  Administered 2015-03-26: 30 mg via INTRAVENOUS
  Filled 2015-03-26: qty 2

## 2015-03-26 NOTE — Discharge Instructions (Signed)
Flank Pain °Flank pain refers to pain that is located on the side of the body between the upper abdomen and the back. The pain may occur over a short period of time (acute) or may be long-term or reoccurring (chronic). It may be mild or severe. Flank pain can be caused by many things. °CAUSES  °Some of the more common causes of flank pain include: °· Muscle strains.   °· Muscle spasms.   °· A disease of your spine (vertebral disk disease).   °· A lung infection (pneumonia).   °· Fluid around your lungs (pulmonary edema).   °· A kidney infection.   °· Kidney stones.   °· A very painful skin rash caused by the chickenpox virus (shingles).   °· Gallbladder disease.   °HOME CARE INSTRUCTIONS  °Home care will depend on the cause of your pain. In general, °· Rest as directed by your caregiver. °· Drink enough fluids to keep your urine clear or pale yellow. °· Only take over-the-counter or prescription medicines as directed by your caregiver. Some medicines may help relieve the pain. °· Tell your caregiver about any changes in your pain. °· Follow up with your caregiver as directed. °SEEK IMMEDIATE MEDICAL CARE IF:  °· Your pain is not controlled with medicine.   °· You have new or worsening symptoms. °· Your pain increases.   °· You have abdominal pain.   °· You have shortness of breath.   °· You have persistent nausea or vomiting.   °· You have swelling in your abdomen.   °· You feel faint or pass out.   °· You have blood in your urine. °· You have a fever or persistent symptoms for more than 2-3 days. °· You have a fever and your symptoms suddenly get worse. °MAKE SURE YOU:  °· Understand these instructions. °· Will watch your condition. °· Will get help right away if you are not doing well or get worse. °  °This information is not intended to replace advice given to you by your health care provider. Make sure you discuss any questions you have with your health care provider. °  °Document Released: 05/21/2005 Document  Revised: 12/23/2011 Document Reviewed: 11/12/2011 °Elsevier Interactive Patient Education ©2016 [employer] ° °

## 2015-03-26 NOTE — ED Notes (Signed)
Patient states that he is having left flank pain with pain into his testicles. The patient reports that he has a known kidney stone

## 2015-03-26 NOTE — ED Provider Notes (Signed)
CSN: [id number]     Arrival date & time 03/26/15  1518 History   First MD Initiated Contact with Patient 03/26/15 1531     Chief Complaint  Patient presents with  . Flank Pain     (Consider location/radiation/quality/duration/timing/severity/associated sxs/prior Treatment) HPI Comments: Left flank pain, left groin/testicles No trauma Dysuria/heamturia   Patient is a 34 y.o. male presenting with flank pain.  Flank Pain This is a new problem. The current episode started yesterday. The problem occurs constantly (COMES AND GOES). The problem has not changed since onset.Associated symptoms include abdominal pain and headaches (hx of, mild). Pertinent negatives include no chest pain and no shortness of breath. He has tried nothing for the symptoms. The treatment provided no relief.    Past Medical History  Diagnosis Date  . Renal disorder   . Kidney stone   . Kidney stones   . Bulging lumbar disc   . Arthritis   . Depression    Past Surgical History  Procedure Laterality Date  . Lithotripsy     History reviewed. No pertinent family history. Social History  Substance Use Topics  . Smoking status: Current Every Day Smoker -- 0.50 packs/day for 7 years    Types: Cigarettes  . Smokeless tobacco: Never Used  . Alcohol Use: Yes     Comment: rarely    Review of Systems  Constitutional: Negative for fever.  HENT: Negative for sore throat.   Eyes: Negative for visual disturbance.  Respiratory: Negative for shortness of breath.   Cardiovascular: Negative for chest pain.  Gastrointestinal: Positive for nausea and abdominal pain. Negative for vomiting, diarrhea and constipation.  Genitourinary: Positive for dysuria, hematuria, flank pain and testicular pain. Negative for difficulty urinating.  Musculoskeletal: Negative for back pain and neck stiffness.  Skin: Negative for rash.  Neurological: Positive for headaches (hx of, mild). Negative for syncope.      Allergies  Review  of patient's allergies indicates no known allergies.  Home Medications   Prior to Admission medications   Medication Sig Start Date End Date Taking? Authorizing Provider  acetaminophen (TYLENOL) 500 MG tablet Take 1 tablet (500 mg total) by mouth every 6 (six) hours as needed. 02/26/15   Cheri FowlerKayla Rose, PA-C  GABAPENTIN PO Take by mouth.    Historical Provider, MD  HYDROcodone-acetaminophen (NORCO/VICODIN) 5-325 MG tablet Take 1 tablet by mouth every 6 (six) hours as needed for moderate pain. 01/28/15   Charlestine Nighthristopher Lawyer, PA-C  ibuprofen (ADVIL,MOTRIN) 800 MG tablet Take 1 tablet (800 mg total) by mouth 3 (three) times daily. 02/26/15   Cheri FowlerKayla Rose, PA-C  naproxen (NAPROSYN) 500 MG tablet Take 1 tablet (500 mg total) by mouth 2 (two) times daily. 03/26/15   Alvira MondayErin Sevana Grandinetti, MD  promethazine (PHENERGAN) 25 MG tablet Take 1 tablet (25 mg total) by mouth every 8 (eight) hours as needed for nausea or vomiting. 01/28/15   Christopher Lawyer, PA-C   BP 108/81 mmHg  Pulse 85  Temp(Src) 98.5 F (36.9 C) (Oral)  Ht 5\' 8"  (1.727 m)  Wt 150 lb (68.04 kg)  BMI 22.81 kg/m2  SpO2 94% Physical Exam  Constitutional: He is oriented to person, place, and time. He appears well-developed and well-nourished. No distress.  HENT:  Head: Normocephalic and atraumatic.  Eyes: Conjunctivae and EOM are normal.  Neck: Normal range of motion.  Cardiovascular: Normal rate, regular rhythm, normal heart sounds and intact distal pulses.  Exam reveals no gallop and no friction rub.   No murmur heard. Pulmonary/Chest:  Effort normal and breath sounds normal. No respiratory distress. He has no wheezes. He has no rales.  Abdominal: Soft. He exhibits no distension. There is tenderness (mild LLQ). There is CVA tenderness (left side). There is no guarding. Hernia confirmed negative in the right inguinal area and confirmed negative in the left inguinal area.  Genitourinary: Right testis shows no mass, no swelling and no tenderness.  Cremasteric reflex is not absent on the right side. Left testis shows tenderness (mild). Left testis shows no mass and no swelling. Cremasteric reflex is not absent on the left side.  Musculoskeletal: He exhibits no edema.  Neurological: He is alert and oriented to person, place, and time.  Skin: Skin is warm and dry. He is not diaphoretic.  Nursing note and vitals reviewed.   ED Course  Procedures (including critical care time) Labs Review Labs Reviewed  COMPREHENSIVE METABOLIC PANEL - Abnormal; Notable for the following:    Glucose, Bld 102 (*)    ALT 12 (*)    All other components within normal limits  CBC WITH DIFFERENTIAL/PLATELET  URINALYSIS, ROUTINE W REFLEX MICROSCOPIC (NOT AT [facility])    Imaging Review Dg Abd 1 View  03/26/2015  CLINICAL DATA:  Left flank pain for 2 days EXAM: ABDOMEN - 1 VIEW COMPARISON:  CT abdomen and pelvis February 26, 2015 FINDINGS: There is moderate stool in the colon. There is no bowel dilatation or air-fluid level suggesting obstruction. No free air. No abnormal calcifications are appreciated. IMPRESSION: No appreciable abnormal calcifications. Bowel gas pattern unremarkable. Electronically Signed   By: Bretta Bang III M.D.   On: 03/26/2015 17:22   US Renal  03/26/2015  CLINICAL DATA:  Left-sided flank pain for 2 days EXAM: RENAL / URINARY TRACT ULTRASOUND COMPLETE COMPARISON:  02/26/2015 FINDINGS: Right Kidney: Length: 10.7 cm. Scattered small 3-4 mm stones are identified. No obstructive changes are seen. Left Kidney: Length: 11.2 cm. A few small areas of increased echogenicity are noted without significant posterior acoustical shadowing. These may represent small areas of fat as no definitive calculi were seen on the recent CT examination from 1 month ago. Bladder: Appears normal for degree of bladder distention. IMPRESSION: Nonobstructing right renal stones. No other definitive abnormality is seen. Electronically Signed   By: Alcide Clever M.D.   On:  03/26/2015 17:16   I have personally reviewed and evaluated these images and lab results as part of my medical decision-making.   EKG Interpretation None      MDM   Final diagnoses:  Left flank pain   34yo male with history of nephrolithiasis presents with left groin pain radiating to the left flank.  CBC shows no leukocytosis, normal hemoglobin. CMP is within normal limits. Urinalysis shows no sign of UTI or hematuria. X-ray of the abdomen shows no clear renal calculi, and renal ultrasound shows no sign of hydronephrosis, however possibility of nonobstructing right renal stones. Have low suspicion for testicular pathology, with bilateral cremaster reflexes in place, normal testicular exam, no sign of hernia, and multiple presentations to the emergency department in the past with flank pain radiating to the groin. Recommend follow-up with urologist given flank pain and history of nephrolithiasis. Patient was given a prescription for naproxen and discharged in stable condition with understanding of reasons to return.     Alvira Monday, MD 03/26/15 1743

## 2015-06-04 ENCOUNTER — Encounter (HOSPITAL_BASED_OUTPATIENT_CLINIC_OR_DEPARTMENT_OTHER): Payer: Self-pay

## 2015-06-04 ENCOUNTER — Emergency Department (HOSPITAL_BASED_OUTPATIENT_CLINIC_OR_DEPARTMENT_OTHER): Payer: Self-pay

## 2015-06-04 ENCOUNTER — Emergency Department (HOSPITAL_BASED_OUTPATIENT_CLINIC_OR_DEPARTMENT_OTHER)
Admission: EM | Admit: 2015-06-04 | Discharge: 2015-06-04 | Disposition: A | Discharge status: Home / Self Care | Payer: Self-pay | Attending: Emergency Medicine | Admitting: Emergency Medicine

## 2015-06-04 DIAGNOSIS — Z87442 Personal history of urinary calculi: Secondary | ICD-10-CM | POA: Insufficient documentation

## 2015-06-04 DIAGNOSIS — M545 Low back pain: Secondary | ICD-10-CM | POA: Insufficient documentation

## 2015-06-04 DIAGNOSIS — R110 Nausea: Secondary | ICD-10-CM | POA: Insufficient documentation

## 2015-06-04 DIAGNOSIS — F17210 Nicotine dependence, cigarettes, uncomplicated: Secondary | ICD-10-CM | POA: Insufficient documentation

## 2015-06-04 DIAGNOSIS — F329 Major depressive disorder, single episode, unspecified: Secondary | ICD-10-CM | POA: Insufficient documentation

## 2015-06-04 DIAGNOSIS — Z87448 Personal history of other diseases of urinary system: Secondary | ICD-10-CM | POA: Insufficient documentation

## 2015-06-04 DIAGNOSIS — R1011 Right upper quadrant pain: Secondary | ICD-10-CM | POA: Insufficient documentation

## 2015-06-04 LAB — COMPREHENSIVE METABOLIC PANEL
ALT: 16 U/L — ABNORMAL LOW (ref 17–63)
ANION GAP: 6 (ref 5–15)
AST: 18 U/L (ref 15–41)
Albumin: 4.2 g/dL (ref 3.5–5.0)
Alkaline Phosphatase: 58 U/L (ref 38–126)
BUN: 15 mg/dL (ref 6–20)
CALCIUM: 8.9 mg/dL (ref 8.9–10.3)
CHLORIDE: 105 mmol/L (ref 101–111)
CO2: 29 mmol/L (ref 22–32)
Creatinine, Ser: 1.02 mg/dL (ref 0.61–1.24)
Glucose, Bld: 100 mg/dL — ABNORMAL HIGH (ref 65–99)
Potassium: 4.1 mmol/L (ref 3.5–5.1)
SODIUM: 140 mmol/L (ref 135–145)
Total Bilirubin: 0.6 mg/dL (ref 0.3–1.2)
Total Protein: 6.7 g/dL (ref 6.5–8.1)

## 2015-06-04 LAB — CBC WITH DIFFERENTIAL/PLATELET
BASOS PCT: 0 %
Basophils Absolute: 0 10*3/uL (ref 0.0–0.1)
Eosinophils Absolute: 0.1 10*3/uL (ref 0.0–0.7)
Eosinophils Relative: 1 %
HCT: 42.2 % (ref 39.0–52.0)
HEMOGLOBIN: 14 g/dL (ref 13.0–17.0)
Lymphocytes Relative: 28 %
Lymphs Abs: 1.8 10*3/uL (ref 0.7–4.0)
MCH: 31 pg (ref 26.0–34.0)
MCHC: 33.2 g/dL (ref 30.0–36.0)
MCV: 93.4 fL (ref 78.0–100.0)
MONOS PCT: 8 %
Monocytes Absolute: 0.5 10*3/uL (ref 0.1–1.0)
NEUTROS ABS: 4.1 10*3/uL (ref 1.7–7.7)
NEUTROS PCT: 63 %
Platelets: 239 10*3/uL (ref 150–400)
RBC: 4.52 MIL/uL (ref 4.22–5.81)
RDW: 13 % (ref 11.5–15.5)
WBC: 6.6 10*3/uL (ref 4.0–10.5)

## 2015-06-04 LAB — URINALYSIS, ROUTINE W REFLEX MICROSCOPIC
BILIRUBIN URINE: NEGATIVE
Glucose, UA: NEGATIVE mg/dL
HGB URINE DIPSTICK: NEGATIVE
Ketones, ur: NEGATIVE mg/dL
Leukocytes, UA: NEGATIVE
Nitrite: NEGATIVE
PROTEIN: NEGATIVE mg/dL
Specific Gravity, Urine: 1.021 (ref 1.005–1.030)
pH: 8 (ref 5.0–8.0)

## 2015-06-04 LAB — LIPASE, BLOOD: LIPASE: 33 U/L (ref 11–51)

## 2015-06-04 MED ORDER — MORPHINE SULFATE (PF) 4 MG/ML IV SOLN
4.0000 mg | Freq: Once | INTRAVENOUS | Status: AC
  Administered 2015-06-04: 4 mg via INTRAVENOUS
  Filled 2015-06-04: qty 1

## 2015-06-04 MED ORDER — SODIUM CHLORIDE 0.9 % IV BOLUS (SEPSIS)
1000.0000 mL | Freq: Once | INTRAVENOUS | Status: AC
  Administered 2015-06-04: 1000 mL via INTRAVENOUS

## 2015-06-04 MED ORDER — NAPROXEN 500 MG PO TABS: 500.0000 mg | ORAL_TABLET | Freq: Two times a day (BID) | ORAL | Status: DC

## 2015-06-04 MED ORDER — ONDANSETRON HCL 4 MG/2ML IJ SOLN
4.0000 mg | Freq: Once | INTRAMUSCULAR | Status: AC
  Administered 2015-06-04: 4 mg via INTRAVENOUS
  Filled 2015-06-04: qty 2

## 2015-06-04 MED FILL — NAPROXEN 500 MG TABLET: 500 | 15 days supply | Qty: 30 | Fill #0

## 2015-06-04 NOTE — ED Provider Notes (Addendum)
CSN: [id number]     Arrival date & time 06/04/15  1322 History   First MD Initiated Contact with Patient 06/04/15 1426     Chief Complaint  Patient presents with  . Back Pain     (Consider location/radiation/quality/duration/timing/severity/associated sxs/prior Treatment) HPI Comments: Patient presents with right-sided abdominal pain. He has a history of kidney stones and reports ongoing issues with abdominal pain for about 5-6 years. Over the last 6 months to a year he's had recurrent pain in his right abdomen and flank area which is been unexplained. He's had CT scans as well as renal ultrasounds that showed only small stones in the kidneys but no ureteral stones. He has had a good explanation for the pain. He subsequently has lost his insurance and currently doesn't have a PCP. He reports a one-day history of worsening pain in his right upper abdomen. He's had nausea but no vomiting. The pain doesn't seem to be related to eating. He denies any urinary symptoms. He's having normal bowel movements. He is taking over-the-counter medicines without improvement of symptoms.  Patient is a 35 y.o. male presenting with back pain.  Back Pain Associated symptoms: abdominal pain   Associated symptoms: no chest pain, no fever, no headaches, no numbness and no weakness     Past Medical History  Diagnosis Date  . Renal disorder   . Kidney stone   . Kidney stones   . Bulging lumbar disc   . Arthritis   . Depression    Past Surgical History  Procedure Laterality Date  . Lithotripsy     No family history on file. Social History  Substance Use Topics  . Smoking status: Current Every Day Smoker -- 0.50 packs/day for 7 years    Types: Cigarettes  . Smokeless tobacco: Never Used  . Alcohol Use: Yes     Comment: rarely    Review of Systems  Constitutional: Negative for fever, chills, diaphoresis and fatigue.  HENT: Negative for congestion, rhinorrhea and sneezing.   Eyes: Negative.    Respiratory: Negative for cough, chest tightness and shortness of breath.   Cardiovascular: Negative for chest pain and leg swelling.  Gastrointestinal: Positive for nausea and abdominal pain. Negative for vomiting, diarrhea and blood in stool.  Genitourinary: Negative for frequency, hematuria, flank pain and difficulty urinating.  Musculoskeletal: Positive for back pain. Negative for arthralgias.  Skin: Negative for rash.  Neurological: Negative for dizziness, speech difficulty, weakness, numbness and headaches.      Allergies  Review of patient's allergies indicates no known allergies.  Home Medications   Prior to Admission medications   Medication Sig Start Date End Date Taking? Authorizing Provider  GABAPENTIN PO Take by mouth.    Historical Provider, MD  naproxen (NAPROSYN) 500 MG tablet Take 1 tablet (500 mg total) by mouth 2 (two) times daily. 06/04/15   Rolan Bucco, MD   BP 107/78 mmHg  Pulse 70  Temp(Src) 98.1 F (36.7 C) (Oral)  Resp 16  Ht  (1.753 m)  Wt 155 lb (70.308 kg)  BMI 22.88 kg/m2  SpO2 100% Physical Exam  Constitutional: He is oriented to person, place, and time. He appears well-developed and well-nourished.  HENT:  Head: Normocephalic and atraumatic.  Eyes: Pupils are equal, round, and reactive to light.  Neck: Normal range of motion. Neck supple.  Cardiovascular: Normal rate, regular rhythm and normal heart sounds.   Pulmonary/Chest: Effort normal and breath sounds normal. No respiratory distress. He has no wheezes. He has  no rales. He exhibits no tenderness.  Abdominal: Soft. Bowel sounds are normal. There is tenderness. There is no rebound and no guarding.  Moderate tenderness to the right abdomen. The pain seems more localized to the right upper quadrant. There is no pain in the inguinal or testicular areas.  Musculoskeletal: Normal range of motion. He exhibits no edema.  Lymphadenopathy:    He has no cervical adenopathy.  Neurological: He  is alert and oriented to person, place, and time.  Skin: Skin is warm and dry. No rash noted.  Psychiatric: He has a normal mood and affect.    ED Course  Procedures (including critical care time) Labs Review Labs Reviewed  COMPREHENSIVE METABOLIC PANEL - Abnormal; Notable for the following:    Glucose, Bld 100 (*)    ALT 16 (*)    All other components within normal limits  URINALYSIS, ROUTINE W REFLEX MICROSCOPIC (NOT AT [facility])  CBC WITH DIFFERENTIAL/PLATELET  LIPASE, BLOOD    Imaging Review No results found. I have personally reviewed and evaluated these images and lab results as part of my medical decision-making.   EKG Interpretation None      MDM   Final diagnoses:  Right upper quadrant pain    PT with longstanding hx of abd pain.  Has had multiple CT scans and renal u/s without etiology for pain.  Has not had gallbladder looked at in awhile.  Labs okay.  Awaiting RUQ u/s.  If neg, will d/c.  Recommend PCP or GI f/u.    15:36  U/s neg.  Will d/c.  PT to f/u with free clinic in [facility].  Return precautions given    Rolan Bucco, MD 06/04/15 1530  Rolan Bucco, MD 06/04/15 1537

## 2015-06-04 NOTE — Discharge Instructions (Signed)

## 2015-06-04 NOTE — ED Notes (Signed)
C/o pain to right lower back pain and abd pain x today

## 2015-07-11 ENCOUNTER — Emergency Department (HOSPITAL_BASED_OUTPATIENT_CLINIC_OR_DEPARTMENT_OTHER)
Admission: EM | Admit: 2015-07-11 | Discharge: 2015-07-11 | Disposition: A | Discharge status: Home / Self Care | Payer: Managed Care, Other (non HMO) | Attending: Emergency Medicine | Admitting: Emergency Medicine

## 2015-07-11 ENCOUNTER — Encounter (HOSPITAL_BASED_OUTPATIENT_CLINIC_OR_DEPARTMENT_OTHER): Payer: Self-pay | Admitting: Emergency Medicine

## 2015-07-11 DIAGNOSIS — Z791 Long term (current) use of non-steroidal anti-inflammatories (NSAID): Secondary | ICD-10-CM | POA: Insufficient documentation

## 2015-07-11 DIAGNOSIS — R300 Dysuria: Secondary | ICD-10-CM | POA: Insufficient documentation

## 2015-07-11 DIAGNOSIS — F329 Major depressive disorder, single episode, unspecified: Secondary | ICD-10-CM | POA: Insufficient documentation

## 2015-07-11 DIAGNOSIS — Z87442 Personal history of urinary calculi: Secondary | ICD-10-CM | POA: Insufficient documentation

## 2015-07-11 DIAGNOSIS — R1030 Lower abdominal pain, unspecified: Secondary | ICD-10-CM | POA: Insufficient documentation

## 2015-07-11 DIAGNOSIS — M545 Low back pain, unspecified: Secondary | ICD-10-CM

## 2015-07-11 DIAGNOSIS — Z87448 Personal history of other diseases of urinary system: Secondary | ICD-10-CM | POA: Insufficient documentation

## 2015-07-11 DIAGNOSIS — M1990 Unspecified osteoarthritis, unspecified site: Secondary | ICD-10-CM | POA: Insufficient documentation

## 2015-07-11 DIAGNOSIS — R319 Hematuria, unspecified: Secondary | ICD-10-CM | POA: Insufficient documentation

## 2015-07-11 DIAGNOSIS — R110 Nausea: Secondary | ICD-10-CM | POA: Insufficient documentation

## 2015-07-11 DIAGNOSIS — R197 Diarrhea, unspecified: Secondary | ICD-10-CM | POA: Insufficient documentation

## 2015-07-11 DIAGNOSIS — F17210 Nicotine dependence, cigarettes, uncomplicated: Secondary | ICD-10-CM | POA: Insufficient documentation

## 2015-07-11 LAB — URINALYSIS, ROUTINE W REFLEX MICROSCOPIC
BILIRUBIN URINE: NEGATIVE
GLUCOSE, UA: NEGATIVE mg/dL
KETONES UR: 15 mg/dL — AB
Leukocytes, UA: NEGATIVE
NITRITE: NEGATIVE
PH: 6.5 (ref 5.0–8.0)
Protein, ur: 30 mg/dL — AB
Specific Gravity, Urine: 1.01 (ref 1.005–1.030)

## 2015-07-11 LAB — URINE MICROSCOPIC-ADD ON

## 2015-07-11 MED ORDER — TRAMADOL HCL 50 MG PO TABS: 50.0000 mg | ORAL_TABLET | Freq: Four times a day (QID) | ORAL | Status: DC | PRN

## 2015-07-11 MED ORDER — HYDROCODONE-ACETAMINOPHEN 5-325 MG PO TABS
2.0000 | ORAL_TABLET | Freq: Once | ORAL | Status: AC
  Administered 2015-07-11: 2 via ORAL
  Filled 2015-07-11: qty 2

## 2015-07-11 MED ORDER — KETOROLAC TROMETHAMINE 30 MG/ML IJ SOLN
30.0000 mg | Freq: Once | INTRAMUSCULAR | Status: AC
  Administered 2015-07-11: 30 mg via INTRAMUSCULAR
  Filled 2015-07-11: qty 1

## 2015-07-11 NOTE — ED Provider Notes (Addendum)
CSN: [id number]     Arrival date & time 07/11/15  2050 History  By signing my name below, I, Gonzella Lex, attest that this documentation has been prepared under the direction and in the presence of Cathren Laine, MD. Electronically Signed: Gonzella Lex, Scribe. 07/11/2015. 9:22 PM.  Chief Complaint  Patient presents with  . Flank Pain   The history is provided by the patient. No language interpreter was used.   HPI Comments: Gregory Flores is a 35 y.o. male, with a PMHx of renal disorder, kidney stones, and similar flank pain in the past, who presents to the Emergency Department complaining of sudden onset, constant, moderate bilateral, lower flank pain which began this morning. Pt also reports associated nausea, diarrhea, dysuria, malodorous urine, and intermittent hematuria. Took aleve earlier in day, no recent pain meds.  He denies recent injury or strain, vomiting, and testicular pain. Pt has NKDA.       Past Medical History  Diagnosis Date  . Renal disorder   . Kidney stone   . Kidney stones   . Bulging lumbar disc   . Arthritis   . Depression    Past Surgical History  Procedure Laterality Date  . Lithotripsy     History reviewed. No pertinent family history. Social History  Substance Use Topics  . Smoking status: Current Every Day Smoker -- 0.50 packs/day for 7 years    Types: Cigarettes  . Smokeless tobacco: Never Used  . Alcohol Use: Yes     Comment: rarely    Review of Systems  Constitutional: Negative for fever and chills.  Respiratory: Negative for cough and shortness of breath.   Cardiovascular: Negative for chest pain.  Gastrointestinal: Positive for nausea and diarrhea. Negative for vomiting.  Endocrine: Negative for polyuria.  Genitourinary: Positive for dysuria, hematuria and flank pain. Negative for testicular pain.  Musculoskeletal: Positive for back pain. Negative for neck pain.  Skin: Negative for rash.  All other systems reviewed and  are negative.  Allergies  Review of patient's allergies indicates no known allergies.  Home Medications   Prior to Admission medications   Medication Sig Start Date End Date Taking? Authorizing Provider  GABAPENTIN PO Take by mouth.    Historical Provider, MD  naproxen (NAPROSYN) 500 MG tablet Take 1 tablet (500 mg total) by mouth 2 (two) times daily. 06/04/15   Rolan Bucco, MD   BP 125/82 mmHg  Pulse 84  Temp(Src) 98.2 F (36.8 C) (Oral)  Resp 18  Ht  (1.727 m)  Wt 155 lb (70.308 kg)  BMI 23.57 kg/m2  SpO2 100% Physical Exam  Constitutional: He is oriented to person, place, and time. He appears well-developed and well-nourished. No distress.  HENT:  Head: Normocephalic and atraumatic.  Mouth/Throat: Oropharynx is clear and moist.  Eyes: Conjunctivae are normal. No scleral icterus.  Neck: Neck supple. No tracheal deviation present.  Cardiovascular: Normal rate, regular rhythm, normal heart sounds and intact distal pulses.  Exam reveals no gallop and no friction rub.   No murmur heard. Pulmonary/Chest: Effort normal and breath sounds normal. No respiratory distress. He has no wheezes.  Abdominal: Soft. Bowel sounds are normal. He exhibits no distension and no mass. There is no tenderness. There is no rebound and no guarding.  Genitourinary:  No cva tenderness  Musculoskeletal: He exhibits no edema.  TLS spine non tender. Mild lumbar muscular tenderness. No sts or skin changes noted.   Neurological: He is alert and oriented to person, place,  and time.  Steady gait.   Skin: Skin is warm and dry. No rash noted. He is not diaphoretic.  Psychiatric: He has a normal mood and affect.  Nursing note and vitals reviewed.   ED Course  Procedures  DIAGNOSTIC STUDIES:    Oxygen Saturation is 100% on RA, normal by my interpretation.  Results for orders placed or performed during the hospital encounter of 07/11/15  Urinalysis, Routine w reflex microscopic (not [facility])   Result Value Ref Range   Color, Urine AMBER (A) YELLOW   APPearance CLEAR CLEAR   Specific Gravity, Urine 1.010 1.005 - 1.030   pH 6.5 5.0 - 8.0   Glucose, UA NEGATIVE NEGATIVE mg/dL   Hgb urine dipstick TRACE (A) NEGATIVE   Bilirubin Urine NEGATIVE NEGATIVE   Ketones, ur 15 (A) NEGATIVE mg/dL   Protein, ur 30 (A) NEGATIVE mg/dL   Nitrite NEGATIVE NEGATIVE   Leukocytes, UA NEGATIVE NEGATIVE  Urine microscopic-add on  Result Value Ref Range   Squamous Epithelial / LPF 0-5 (A) NONE SEEN   WBC, UA 0-5 0 - 5 WBC/hpf   RBC / HPF 0-5 0 - 5 RBC/hpf   Bacteria, UA RARE (A) NONE SEEN   Urine-Other MUCOUS PRESENT      COORDINATION OF CARE:  9:15 PM Will administer toradol in the ED. Discussed treatment plan with pt at bedside and pt agreed to plan.   MDM   I personally performed the services described in this documentation, which was scribed in my presence. The recorded information has been reviewed and considered. Cathren LaineKevin Carsyn Boster, MD   Confirmed nkda w pt.  Reviewed nursing notes and prior charts for additional history.   Patient w multiple, multiple prior imaging studies, here and elsewhere to r/o ureteral obstruction, all negative for acute ureteral obstruction.  Pt is noted to have history kidney stnoes and chronic back pain.  toradol im.  Recheck pt comfortable appearing.    ua 0 rbcs, no infxn.   abd soft nt.   Spine nt.  Pt indicates pain persists. abd remains benign. Pt does have ride. Hydrocodone 2 po.   Pt currently appears stable for d/c.        Cathren LaineKevin Nyisha Clippard, MD 07/11/15 [phone]

## 2015-07-11 NOTE — ED Notes (Signed)
Patient states that he is having pain to both "kidneys"  - denies any vomiting at this time

## 2015-07-11 NOTE — Discharge Instructions (Signed)
It was our pleasure to provide your ER care today - we hope that you feel better.  Rest. Drink plenty of fluids.  You may take motrin or aleve as need for pain.  You may also take ultram as need for pain - no driving when taking.  Follow up with primary care doctor, and/or urologist in the coming week.  Return to ER if worse, new symptoms, fevers, persistent vomiting, other concern.   You were given pain medication in the ER - no driving for the next 4 hours.     Back Pain, Adult Back pain is very common in adults.The cause of back pain is rarely dangerous and the pain often gets better over time.The cause of your back pain may not be known. Some common causes of back pain include:  Strain of the muscles or ligaments supporting the spine.  Wear and tear (degeneration) of the spinal disks.  Arthritis.  Direct injury to the back. For many people, back pain may return. Since back pain is rarely dangerous, most people can learn to manage this condition on their own. HOME CARE INSTRUCTIONS Watch your back pain for any changes. The following actions may help to lessen any discomfort you are feeling:  Remain active. It is stressful on your back to sit or stand in one place for long periods of time. Do not sit, drive, or stand in one place for more than 30 minutes at a time. Take short walks on even surfaces as soon as you are able.Try to increase the length of time you walk each day.  Exercise regularly as directed by your health care provider. Exercise helps your back heal faster. It also helps avoid future injury by keeping your muscles strong and flexible.  Do not stay in bed.Resting more than 1-2 days can delay your recovery.  Pay attention to your body when you bend and lift. The most comfortable positions are those that put less stress on your recovering back. Always use proper lifting techniques, including:  Bending your knees.  Keeping the load close to your  body.  Avoiding twisting.  Find a comfortable position to sleep. Use a firm mattress and lie on your side with your knees slightly bent. If you lie on your back, put a pillow under your knees.  Avoid feeling anxious or stressed.Stress increases muscle tension and can worsen back pain.It is important to recognize when you are anxious or stressed and learn ways to manage it, such as with exercise.  Take medicines only as directed by your health care provider. Over-the-counter medicines to reduce pain and inflammation are often the most helpful.Your health care provider may prescribe muscle relaxant drugs.These medicines help dull your pain so you can more quickly return to your normal activities and healthy exercise.  Apply ice to the injured area:  Put ice in a plastic bag.  Place a towel between your skin and the bag.  Leave the ice on for 20 minutes, 2-3 times a day for the first 2-3 days. After that, ice and heat may be alternated to reduce pain and spasms.  Maintain a healthy weight. Excess weight puts extra stress on your back and makes it difficult to maintain good posture. SEEK MEDICAL CARE IF:  You have pain that is not relieved with rest or medicine.  You have increasing pain going down into the legs or buttocks.  You have pain that does not improve in one week.  You have night pain.  You lose weight.  You have a fever or chills. SEEK IMMEDIATE MEDICAL CARE IF:   You develop new bowel or bladder control problems.  You have unusual weakness or numbness in your arms or legs.  You develop nausea or vomiting.  You develop abdominal pain.  You feel faint.   This information is not intended to replace advice given to you by your health care provider. Make sure you discuss any questions you have with your health care provider.   Document Released: 03/30/2005 Document Revised: 04/20/2014 Document Reviewed: 08/01/2013 Elsevier Interactive Patient Education [id number]  [employer]

## 2015-08-27 ENCOUNTER — Emergency Department (HOSPITAL_BASED_OUTPATIENT_CLINIC_OR_DEPARTMENT_OTHER)
Admission: EM | Admit: 2015-08-27 | Discharge: 2015-08-27 | Disposition: A | Discharge status: Home / Self Care | Payer: Managed Care, Other (non HMO) | Attending: Emergency Medicine | Admitting: Emergency Medicine

## 2015-08-27 ENCOUNTER — Encounter (HOSPITAL_BASED_OUTPATIENT_CLINIC_OR_DEPARTMENT_OTHER): Payer: Self-pay

## 2015-08-27 DIAGNOSIS — F329 Major depressive disorder, single episode, unspecified: Secondary | ICD-10-CM | POA: Insufficient documentation

## 2015-08-27 DIAGNOSIS — F17210 Nicotine dependence, cigarettes, uncomplicated: Secondary | ICD-10-CM | POA: Insufficient documentation

## 2015-08-27 DIAGNOSIS — N200 Calculus of kidney: Secondary | ICD-10-CM

## 2015-08-27 LAB — CBC WITH DIFFERENTIAL/PLATELET
Basophils Absolute: 0 10*3/uL (ref 0.0–0.1)
Basophils Relative: 0 %
EOS PCT: 2 %
Eosinophils Absolute: 0.2 10*3/uL (ref 0.0–0.7)
HEMATOCRIT: 43.1 % (ref 39.0–52.0)
HEMOGLOBIN: 15.1 g/dL (ref 13.0–17.0)
LYMPHS ABS: 3.2 10*3/uL (ref 0.7–4.0)
LYMPHS PCT: 37 %
MCH: 32.3 pg (ref 26.0–34.0)
MCHC: 35 g/dL (ref 30.0–36.0)
MCV: 92.1 fL (ref 78.0–100.0)
Monocytes Absolute: 0.9 10*3/uL (ref 0.1–1.0)
Monocytes Relative: 10 %
Neutro Abs: 4.6 10*3/uL (ref 1.7–7.7)
Neutrophils Relative %: 51 %
PLATELETS: 322 10*3/uL (ref 150–400)
RBC: 4.68 MIL/uL (ref 4.22–5.81)
RDW: 12.8 % (ref 11.5–15.5)
WBC: 8.9 10*3/uL (ref 4.0–10.5)

## 2015-08-27 LAB — URINALYSIS, ROUTINE W REFLEX MICROSCOPIC
BILIRUBIN URINE: NEGATIVE
Glucose, UA: NEGATIVE mg/dL
KETONES UR: NEGATIVE mg/dL
Leukocytes, UA: NEGATIVE
NITRITE: NEGATIVE
PH: 6 (ref 5.0–8.0)
Protein, ur: NEGATIVE mg/dL
Specific Gravity, Urine: 1.023 (ref 1.005–1.030)

## 2015-08-27 LAB — BASIC METABOLIC PANEL
Anion gap: 9 (ref 5–15)
BUN: 18 mg/dL (ref 6–20)
CHLORIDE: 105 mmol/L (ref 101–111)
CO2: 25 mmol/L (ref 22–32)
Calcium: 9.4 mg/dL (ref 8.9–10.3)
Creatinine, Ser: 0.99 mg/dL (ref 0.61–1.24)
GFR calc Af Amer: >60 mL/min (ref >60)
GFR calc non Af Amer: >60 mL/min (ref >60)
GLUCOSE: 85 mg/dL (ref 65–99)
POTASSIUM: 4.3 mmol/L (ref 3.5–5.1)
Sodium: 139 mmol/L (ref 135–145)

## 2015-08-27 LAB — URINE MICROSCOPIC-ADD ON: WBC UA: NONE SEEN WBC/hpf (ref 0–5)

## 2015-08-27 MED ORDER — ONDANSETRON HCL 4 MG/2ML IJ SOLN
4.0000 mg | Freq: Once | INTRAMUSCULAR | Status: AC
  Administered 2015-08-27: 4 mg via INTRAVENOUS
  Filled 2015-08-27: qty 2

## 2015-08-27 MED ORDER — HYDROMORPHONE HCL 1 MG/ML IJ SOLN
1.0000 mg | Freq: Once | INTRAMUSCULAR | Status: AC
  Administered 2015-08-27: 1 mg via INTRAVENOUS
  Filled 2015-08-27: qty 1

## 2015-08-27 MED ORDER — KETOROLAC TROMETHAMINE 15 MG/ML IJ SOLN
15.0000 mg | Freq: Once | INTRAMUSCULAR | Status: AC
  Administered 2015-08-27: 15 mg via INTRAVENOUS
  Filled 2015-08-27: qty 1

## 2015-08-27 MED ORDER — ONDANSETRON HCL 4 MG PO TABS: 4.0000 mg | ORAL_TABLET | Freq: Four times a day (QID) | ORAL | Status: DC

## 2015-08-27 MED ORDER — HYDROCODONE-ACETAMINOPHEN 5-325 MG PO TABS: 1.0000 | ORAL_TABLET | Freq: Four times a day (QID) | ORAL | Status: DC | PRN

## 2015-08-27 NOTE — Discharge Instructions (Signed)
Kidney Stones °Kidney stones (urolithiasis) are deposits that form inside your kidneys. The intense pain is caused by the stone moving through the urinary tract. When the stone moves, the ureter goes into spasm around the stone. The stone is usually passed in the urine.  °CAUSES  °· A disorder that makes certain neck glands produce too much parathyroid hormone (primary hyperparathyroidism). °· A buildup of uric acid crystals, similar to gout in your joints. °· Narrowing (stricture) of the ureter. °· A kidney obstruction present at birth (congenital obstruction). °· Previous surgery on the kidney or ureters. °· Numerous kidney infections. °SYMPTOMS  °· Feeling sick to your stomach (nauseous). °· Throwing up (vomiting). °· Blood in the urine (hematuria). °· Pain that usually spreads (radiates) to the groin. °· Frequency or urgency of urination. °DIAGNOSIS  °· Taking a history and physical exam. °· Blood or urine tests. °· CT scan. °· Occasionally, an examination of the inside of the urinary bladder (cystoscopy) is performed. °TREATMENT  °· Observation. °· Increasing your fluid intake. °· Extracorporeal shock wave lithotripsy--This is a noninvasive procedure that uses shock waves to break up kidney stones. °· Surgery may be needed if you have severe pain or persistent obstruction. There are various surgical procedures. Most of the procedures are performed with the use of small instruments. Only small incisions are needed to accommodate these instruments, so recovery time is minimized. °The size, location, and chemical composition are all important variables that will determine the proper choice of action for you. Talk to your health care provider to better understand your situation so that you will minimize the risk of injury to yourself and your kidney.  °HOME CARE INSTRUCTIONS  °· Drink enough water and fluids to keep your urine clear or pale yellow. This will help you to pass the stone or stone fragments. °· Strain  all urine through the provided strainer. Keep all particulate matter and stones for your health care provider to see. The stone causing the pain may be as small as a grain of salt. It is very important to use the strainer each and every time you pass your urine. The collection of your stone will allow your health care provider to analyze it and verify that a stone has actually passed. The stone analysis will often identify what you can do to reduce the incidence of recurrences. °· Only take over-the-counter or prescription medicines for pain, discomfort, or fever as directed by your health care provider. °· Keep all follow-up visits as told by your health care provider. This is important. °· Get follow-up X-rays if required. The absence of pain does not always mean that the stone has passed. It may have only stopped moving. If the urine remains completely obstructed, it can cause loss of kidney function or even complete destruction of the kidney. It is your responsibility to make sure X-rays and follow-ups are completed. Ultrasounds of the kidney can show blockages and the status of the kidney. Ultrasounds are not associated with any radiation and can be performed easily in a matter of minutes. °· Make changes to your daily diet as told by your health care provider. You may be told to: °¨ Limit the amount of salt that you eat. °¨ Eat 5 or more servings of fruits and vegetables each day. °¨ Limit the amount of meat, poultry, fish, and eggs that you eat. °· Collect a 24-hour urine sample as told by your health care provider. You may need to collect another urine sample every 6-12   months. °SEEK MEDICAL CARE IF: °· You experience pain that is progressive and unresponsive to any pain medicine you have been prescribed. °SEEK IMMEDIATE MEDICAL CARE IF:  °· Pain cannot be controlled with the prescribed medicine. °· You have a fever or shaking chills. °· The severity or intensity of pain increases over 18 hours and is not  relieved by pain medicine. °· You develop a new onset of abdominal pain. °· You feel faint or pass out. °· You are unable to urinate. °  °This information is not intended to replace advice given to you by your health care provider. Make sure you discuss any questions you have with your health care provider. °  °Document Released: 03/30/2005 Document Revised: 12/19/2014 Document Reviewed: 08/31/2012 °Elsevier Interactive Patient Education ©2016 Elsevier Inc. ° °

## 2015-08-27 NOTE — ED Notes (Signed)
Left flank pain x today

## 2015-08-27 NOTE — ED Provider Notes (Addendum)
CSN: [id number]     Arrival date & time 08/27/15  1815 History  By signing my name below, I, Freida Busman, attest that this documentation has been prepared under the direction and in the presence of Gwyneth Sprout, MD . Electronically Signed: Freida Busman, Scribe. 08/27/2015. 7:19 PM.   Chief Complaint  Patient presents with  . Flank Pain   The history is provided by the patient and the spouse. No language interpreter was used.    HPI Comments:  Gregory Flores is a 35 y.o. male with a history of kidney stones, kidney stents, and lithrotripsy, who presents to the Emergency Department complaining of 10/10 left sided flank pain since this AM. Pt notes the pain woke him from his sleep. He describes a sharp pain to his lower back which is similar to pain felt with past stones and an aching/throbbing pain that is dissimilar to past kidney stones. Pt's wife notes  was recently lifting furniture but denies obvious injury.  He reports associated nausea and dysuria. He denies vomting and fever. He has taken Excedrin, ibuprofen, and gabapentin without relief. His last kidney stone was ~ 3-4 months ago.   Past Medical History  Diagnosis Date  . Renal disorder   . Kidney stone   . Kidney stones   . Bulging lumbar disc   . Arthritis   . Depression    Past Surgical History  Procedure Laterality Date  . Lithotripsy     No family history on file. Social History  Substance Use Topics  . Smoking status: Current Every Day Smoker -- 0.50 packs/day for 7 years    Types: Cigarettes  . Smokeless tobacco: Never Used  . Alcohol Use: Yes     Comment: occ    Review of Systems  Constitutional: Negative for fever.  Gastrointestinal: Positive for nausea. Negative for vomiting.  Genitourinary: Positive for dysuria and flank pain.  All other systems reviewed and are negative.  Allergies  Review of patient's allergies indicates no known allergies.  Home Medications   Prior to Admission medications    Medication Sig Start Date End Date Taking? Authorizing Provider  GABAPENTIN PO Take by mouth.    Historical Provider, MD   BP 128/58 mmHg  Pulse 116  Temp(Src) 98.3 F (36.8 C) (Oral)  Resp 18  Ht  (1.727 m)  Wt 155 lb (70.308 kg)  BMI 23.57 kg/m2  SpO2 100% Physical Exam  Constitutional: He is oriented to person, place, and time. He appears well-developed and well-nourished.  Appears to be in pain  Mildly diaphoretic  HENT:  Head: Normocephalic and atraumatic.  Eyes: EOM are normal.  Neck: Normal range of motion.  Cardiovascular: Regular rhythm, normal heart sounds and intact distal pulses.  Tachycardia present.   Pulmonary/Chest: Effort normal and breath sounds normal. No respiratory distress.  Abdominal: Soft. He exhibits no distension. There is tenderness in the left lower quadrant. There is CVA tenderness.  Left CVA and minimal LLQ tenderness  Musculoskeletal: Normal range of motion.  Neurological: He is alert and oriented to person, place, and time.  Skin: Skin is warm.  Psychiatric: He has a normal mood and affect. Judgment normal.  Nursing note and vitals reviewed.   ED Course  Procedures  DIAGNOSTIC STUDIES:  Oxygen Saturation is 100% on RA, normal by my interpretation.    COORDINATION OF CARE:  6:54 PM Discussed treatment plan with pt at bedside and pt agreed to plan.  Labs Review Labs Reviewed  URINALYSIS, ROUTINE W  REFLEX [facility]CROSCOPIC (NOT [facility]) - Abnormal; Notable for the following:    APPearance CLOUDY (*)    Hgb urine dipstick MODERATE (*)    All other components within normal limits  URINE MICROSCOPIC-ADD ON - Abnormal; Notable for the following:    Squamous Epithelial / LPF 0-5 (*)    Bacteria, UA RARE (*)    All other components within normal limits  CBC WITH DIFFERENTIAL/PLATELET  BASIC METABOLIC PANEL    Imaging Review No results found. I have personally reviewed and evaluated these images and lab results as part of my medical  decision-making.   EKG Interpretation None      MDM   Final diagnoses:  Kidney stone on left side    Pt with symptoms consistent with kidney stone and prior hx of same. Approximate one year ago had to have stents and lithotripsy. Denies infectious sx, or GI symptoms.  Low concern for diverticulitis and no risk factors or history suggestive of AAA.  No hx suggestive of GU source (discharge) and otherwise pt is healthy.  Will hydrate, treat pain and UA with blood but no sign of infection.  CBC and BMP pending.  Patient does not have insurance at this time and has not seen a urologist in approximately one year. Given his repetitive issues with kidney stones do not feel that imaging is necessary unless acute renal failure or inability to get pain control.  8:19 PM Pain is significantly improved. Patient does have a strainer at home. He was given oral pain medication and follow-up with [facility].  I personally performed the services described in this documentation, which was scribed in my presence.  The recorded information has been reviewed and considered.    Gwyneth SproutWhitney Lakechia Nay, MD 08/27/15 [id number]  Gwyneth SproutWhitney Delontae Lamm, MD 08/27/15 2020

## 2015-10-10 ENCOUNTER — Emergency Department (HOSPITAL_BASED_OUTPATIENT_CLINIC_OR_DEPARTMENT_OTHER)
Admission: EM | Admit: 2015-10-10 | Discharge: 2015-10-10 | Disposition: A | Discharge status: Home / Self Care | Payer: Self-pay | Attending: Emergency Medicine | Admitting: Emergency Medicine

## 2015-10-10 ENCOUNTER — Emergency Department (HOSPITAL_BASED_OUTPATIENT_CLINIC_OR_DEPARTMENT_OTHER): Payer: Self-pay

## 2015-10-10 ENCOUNTER — Encounter (HOSPITAL_BASED_OUTPATIENT_CLINIC_OR_DEPARTMENT_OTHER): Payer: Self-pay | Admitting: *Deleted

## 2015-10-10 DIAGNOSIS — F17210 Nicotine dependence, cigarettes, uncomplicated: Secondary | ICD-10-CM | POA: Insufficient documentation

## 2015-10-10 DIAGNOSIS — N200 Calculus of kidney: Secondary | ICD-10-CM | POA: Insufficient documentation

## 2015-10-10 DIAGNOSIS — F329 Major depressive disorder, single episode, unspecified: Secondary | ICD-10-CM | POA: Insufficient documentation

## 2015-10-10 DIAGNOSIS — Z79899 Other long term (current) drug therapy: Secondary | ICD-10-CM | POA: Insufficient documentation

## 2015-10-10 DIAGNOSIS — N390 Urinary tract infection, site not specified: Secondary | ICD-10-CM | POA: Insufficient documentation

## 2015-10-10 DIAGNOSIS — M1990 Unspecified osteoarthritis, unspecified site: Secondary | ICD-10-CM | POA: Insufficient documentation

## 2015-10-10 DIAGNOSIS — R109 Unspecified abdominal pain: Secondary | ICD-10-CM

## 2015-10-10 LAB — URINALYSIS, ROUTINE W REFLEX MICROSCOPIC
Glucose, UA: NEGATIVE mg/dL
Ketones, ur: >80 mg/dL — AB
Leukocytes, UA: NEGATIVE
Nitrite: NEGATIVE
Protein, ur: NEGATIVE mg/dL
SPECIFIC GRAVITY, URINE: 1.027 (ref 1.005–1.030)
pH: 6 (ref 5.0–8.0)

## 2015-10-10 LAB — CBC WITH DIFFERENTIAL/PLATELET
BASOS ABS: 0 10*3/uL (ref 0.0–0.1)
BASOS PCT: 0 %
Eosinophils Absolute: 0.1 10*3/uL (ref 0.0–0.7)
Eosinophils Relative: 1 %
HEMATOCRIT: 43.6 % (ref 39.0–52.0)
HEMOGLOBIN: 15.4 g/dL (ref 13.0–17.0)
Lymphocytes Relative: 20 %
Lymphs Abs: 1.9 10*3/uL (ref 0.7–4.0)
MCH: 32.1 pg (ref 26.0–34.0)
MCHC: 35.3 g/dL (ref 30.0–36.0)
MCV: 90.8 fL (ref 78.0–100.0)
Monocytes Absolute: 0.7 10*3/uL (ref 0.1–1.0)
Monocytes Relative: 7 %
NEUTROS ABS: 7.1 10*3/uL (ref 1.7–7.7)
NEUTROS PCT: 72 %
Platelets: 273 10*3/uL (ref 150–400)
RBC: 4.8 MIL/uL (ref 4.22–5.81)
RDW: 12.5 % (ref 11.5–15.5)
WBC: 9.9 10*3/uL (ref 4.0–10.5)

## 2015-10-10 LAB — COMPREHENSIVE METABOLIC PANEL
ALBUMIN: 4.8 g/dL (ref 3.5–5.0)
ALT: 16 U/L — AB (ref 17–63)
AST: 18 U/L (ref 15–41)
Alkaline Phosphatase: 65 U/L (ref 38–126)
Anion gap: 12 (ref 5–15)
BILIRUBIN TOTAL: 1.2 mg/dL (ref 0.3–1.2)
BUN: 18 mg/dL (ref 6–20)
CO2: 24 mmol/L (ref 22–32)
Calcium: 9.1 mg/dL (ref 8.9–10.3)
Chloride: 102 mmol/L (ref 101–111)
Creatinine, Ser: 1.15 mg/dL (ref 0.61–1.24)
GFR calc Af Amer: >60 mL/min (ref >60)
GFR calc non Af Amer: >60 mL/min (ref >60)
GLUCOSE: 74 mg/dL (ref 65–99)
POTASSIUM: 3.5 mmol/L (ref 3.5–5.1)
Sodium: 138 mmol/L (ref 135–145)
TOTAL PROTEIN: 7.6 g/dL (ref 6.5–8.1)

## 2015-10-10 LAB — URINE MICROSCOPIC-ADD ON

## 2015-10-10 MED ORDER — ONDANSETRON HCL 4 MG PO TABS: 4.0000 mg | ORAL_TABLET | Freq: Four times a day (QID) | ORAL | Status: DC

## 2015-10-10 MED ORDER — SULFAMETHOXAZOLE-TRIMETHOPRIM 800-160 MG PO TABS
1.0000 | ORAL_TABLET | Freq: Once | ORAL | Status: AC
  Administered 2015-10-10: 1 via ORAL
  Filled 2015-10-10: qty 1

## 2015-10-10 MED ORDER — HYDROMORPHONE HCL 1 MG/ML IJ SOLN
1.0000 mg | Freq: Once | INTRAMUSCULAR | Status: AC
  Administered 2015-10-10: 1 mg via INTRAVENOUS
  Filled 2015-10-10: qty 1

## 2015-10-10 MED ORDER — HYDROCODONE-ACETAMINOPHEN 5-325 MG PO TABS: 1.0000 | ORAL_TABLET | Freq: Four times a day (QID) | ORAL | Status: DC | PRN

## 2015-10-10 MED ORDER — SULFAMETHOXAZOLE-TRIMETHOPRIM 800-160 MG PO TABS: 1.0000 | ORAL_TABLET | Freq: Two times a day (BID) | ORAL | Status: AC

## 2015-10-10 MED ORDER — ONDANSETRON HCL 4 MG/2ML IJ SOLN
4.0000 mg | Freq: Once | INTRAMUSCULAR | Status: AC
Start: 2015-10-10 — End: 2015-10-10
  Administered 2015-10-10: 4 mg via INTRAVENOUS
  Filled 2015-10-10: qty 2

## 2015-10-10 MED ORDER — SODIUM CHLORIDE 0.9 % IV BOLUS (SEPSIS)
1000.0000 mL | Freq: Once | INTRAVENOUS | Status: AC
  Administered 2015-10-10: 1000 mL via INTRAVENOUS

## 2015-10-10 MED ORDER — MORPHINE SULFATE (PF) 4 MG/ML IV SOLN
4.0000 mg | Freq: Once | INTRAVENOUS | Status: AC
  Administered 2015-10-10: 4 mg via INTRAVENOUS
  Filled 2015-10-10: qty 1

## 2015-10-10 MED FILL — HYDROCODON-APAP 5-325: 5-325 | 3 days supply | Qty: 10 | Fill #0

## 2015-10-10 MED FILL — SULFAMETHOXAZOLE-TMP DS TAB: 800-160 | 7 days supply | Qty: 14 | Fill #0

## 2015-10-10 MED FILL — ONDANSETRON HCL 4 MG TABLET: 4 | 3 days supply | Qty: 12 | Fill #0

## 2015-10-10 NOTE — ED Notes (Signed)
Left flank pain since last night and hematuria. Hx of kidney stones.

## 2015-10-10 NOTE — ED Provider Notes (Signed)
CSN: [id number]     Arrival date & time 10/10/15  1334 History   First MD Initiated Contact with Patient 10/10/15 1455     Chief Complaint  Patient presents with  . Flank Pain   PT IS A 35 YO WM WHO SAID THAT HE HAS A KIDNEY STONE.  HE SAID THAT HE HAS LEFT FLANK PAIN AND HEMATURIA SINCE LAST NIGHT.  THE PT SAID THAT HE THINKS HE PASSED THE STONE IN TRIAGE.  PT SAID THAT HE HAS NOT SEEN A UROLOGIST FOR AWHILE BECAUSE HE DID NOT HAVE INSURANCE.  HE HAS INSURANCE NOW.  PT STILL CONTINUES TO HAVE LEFT FLANK PAIN AND NAUSEA.  (Consider location/radiation/quality/duration/timing/severity/associated sxs/prior Treatment) Patient is a 35 y.o. male presenting with flank pain. The history is provided by the patient.  Flank Pain This is a recurrent problem. Associated symptoms include abdominal pain. Nothing aggravates the symptoms.    Past Medical History  Diagnosis Date  . Renal disorder   . Kidney stone   . Kidney stones   . Bulging lumbar disc   . Arthritis   . Depression    Past Surgical History  Procedure Laterality Date  . Lithotripsy     No family history on file. Social History  Substance Use Topics  . Smoking status: Current Every Day Smoker -- 0.50 packs/day for 7 years    Types: Cigarettes  . Smokeless tobacco: Never Used  . Alcohol Use: Yes     Comment: occ    Review of Systems  Gastrointestinal: Positive for nausea and abdominal pain.  Genitourinary: Positive for hematuria and flank pain.  All other systems reviewed and are negative.     Allergies  Review of patient's allergies indicates no known allergies.  Home Medications   Prior to Admission medications   Medication Sig Start Date End Date Taking? Authorizing Provider  GABAPENTIN PO Take by mouth.   Yes Historical Provider, MD  HYDROcodone-acetaminophen (NORCO/VICODIN) 5-325 MG tablet Take 1 tablet by mouth every 6 (six) hours as needed for moderate pain. 10/10/15   Jacalyn LefevreJulie Hershal Eriksson, MD  ondansetron (ZOFRAN)  4 MG tablet Take 1 tablet (4 mg total) by mouth every 6 (six) hours. 10/10/15   Jacalyn LefevreJulie Shan Padgett, MD  sulfamethoxazole-trimethoprim (BACTRIM DS,SEPTRA DS) 800-160 MG tablet Take 1 tablet by mouth 2 (two) times daily. 10/10/15 10/17/15  Jacalyn LefevreJulie Drevin Ortner, MD   BP 121/78 mmHg  Pulse 62  Temp(Src) 98.7 F (37.1 C) (Oral)  Resp 18  Ht 5\' 7"  (1.702 m)  Wt 155 lb (70.308 kg)  BMI 24.27 kg/m2  SpO2 98% Physical Exam  Constitutional: He is oriented to person, place, and time. He appears well-developed and well-nourished. He appears distressed.  HENT:  Head: Normocephalic and atraumatic.  Right Ear: External ear normal.  Left Ear: External ear normal.  Nose: Nose normal.  Mouth/Throat: Oropharynx is clear and moist.  Eyes: Conjunctivae and EOM are normal. Pupils are equal, round, and reactive to light.  Neck: Normal range of motion. Neck supple.  Cardiovascular: Normal rate, regular rhythm, normal heart sounds and intact distal pulses.   Pulmonary/Chest: Effort normal and breath sounds normal.  Abdominal: Soft. Bowel sounds are normal.  Musculoskeletal: Normal range of motion.  Neurological: He is alert and oriented to person, place, and time.  Skin: Skin is warm and dry.  Psychiatric: He has a normal mood and affect. His behavior is normal. Judgment and thought content normal.  Nursing note and vitals reviewed.   ED Course  Procedures (including  critical care time) Labs Review Labs Reviewed  URINALYSIS, ROUTINE W REFLEX MICROSCOPIC (NOT AT [facility]) - Abnormal; Notable for the following:    Color, Urine AMBER (*)    Hgb urine dipstick SMALL (*)    Bilirubin Urine SMALL (*)    Ketones, ur >80 (*)    All other components within normal limits  URINE MICROSCOPIC-ADD ON - Abnormal; Notable for the following:    Squamous Epithelial / LPF 0-5 (*)    Bacteria, UA MANY (*)    All other components within normal limits  COMPREHENSIVE METABOLIC PANEL - Abnormal; Notable for the following:    ALT 16 (*)     All other components within normal limits  URINE CULTURE  CBC WITH DIFFERENTIAL/PLATELET    Imaging Review Ct Renal Stone Study  10/10/2015  CLINICAL DATA:  Left flank pain since last night. History of kidney stones. EXAM: CT ABDOMEN AND PELVIS WITHOUT CONTRAST TECHNIQUE: Multidetector CT imaging of the abdomen and pelvis was performed following the standard protocol without IV contrast. COMPARISON:  CT of the abdomen pelvis 04/29/2015 FINDINGS: Lower chest:  No acute findings. Hepatobiliary: No mass visualized on this un-enhanced exam. Pancreas: No mass or inflammatory process identified on this un-enhanced exam. Spleen: Within normal limits in size. Adrenals/Urinary Tract: There are bilateral nonobstructing renal calculi. The largest calculus within the midpole of the right kidney measures 3.7 mm. No evidence of hydronephrosis or inflammatory perirenal fat stranding. No evidence of ureteral calculi, hydronephrosis or hydroureter. The urinary bladder has normal appearance for its state of distension. Stomach/Bowel: No evidence of obstruction, inflammatory process, or abnormal fluid collections. Normal appendix. Vascular/Lymphatic: No pathologically enlarged lymph nodes. No evidence of abdominal aortic aneurysm. Reproductive: No mass or other significant abnormality. Other: None. Musculoskeletal:  No suspicious bone lesions identified. IMPRESSION: Bilateral nephrolithiasis without evidence of obstructive uropathy. Electronically Signed   By: Ted Mcalpineobrinka  Dimitrova M.D.   On: 10/10/2015 16:21   I have personally reviewed and evaluated these images and lab results as part of my medical decision-making.   EKG Interpretation None      MDM  PT IS FEELING BETTER.  HE KNOWS TO RETURN IF WORSE.  F/U WITH UROLOGY. Final diagnoses:  Nephrolithiasis  Flank pain, acute  UTI (lower urinary tract infection)       Jacalyn LefevreJulie Lyna Laningham, MD 10/10/15 1630

## 2015-10-10 NOTE — ED Notes (Signed)
Pt passed a kidney stone when he urinated at triage.

## 2015-10-11 LAB — URINE CULTURE: Special Requests: NORMAL

## 2015-11-29 ENCOUNTER — Emergency Department (HOSPITAL_BASED_OUTPATIENT_CLINIC_OR_DEPARTMENT_OTHER): Payer: 59

## 2015-11-29 ENCOUNTER — Emergency Department (HOSPITAL_BASED_OUTPATIENT_CLINIC_OR_DEPARTMENT_OTHER)
Admission: EM | Admit: 2015-11-29 | Discharge: 2015-11-29 | Disposition: A | Discharge status: Home / Self Care | Payer: 59 | Attending: Emergency Medicine | Admitting: Emergency Medicine

## 2015-11-29 ENCOUNTER — Encounter (HOSPITAL_BASED_OUTPATIENT_CLINIC_OR_DEPARTMENT_OTHER): Payer: Self-pay

## 2015-11-29 DIAGNOSIS — Y999 Unspecified external cause status: Secondary | ICD-10-CM | POA: Diagnosis not present

## 2015-11-29 DIAGNOSIS — Y9301 Activity, walking, marching and hiking: Secondary | ICD-10-CM | POA: Insufficient documentation

## 2015-11-29 DIAGNOSIS — S9032XA Contusion of left foot, initial encounter: Secondary | ICD-10-CM | POA: Insufficient documentation

## 2015-11-29 DIAGNOSIS — Y929 Unspecified place or not applicable: Secondary | ICD-10-CM | POA: Insufficient documentation

## 2015-11-29 DIAGNOSIS — F17210 Nicotine dependence, cigarettes, uncomplicated: Secondary | ICD-10-CM | POA: Insufficient documentation

## 2015-11-29 DIAGNOSIS — S99922A Unspecified injury of left foot, initial encounter: Secondary | ICD-10-CM | POA: Diagnosis present

## 2015-11-29 DIAGNOSIS — W228XXA Striking against or struck by other objects, initial encounter: Secondary | ICD-10-CM | POA: Diagnosis not present

## 2015-11-29 NOTE — ED Triage Notes (Addendum)
Hit left foot on a bench Tuesday-pt has left great toe and second toe taped together with pink tape-limping gait-NAD

## 2015-11-29 NOTE — ED Provider Notes (Signed)
[facility] Note   CSN: [id number] Arrival date & time: 11/29/15  1201     History   Chief Complaint Chief Complaint  Patient presents with  . Foot Pain    HPI Gregory Flores is a 35 y.o. male.  Patient presents today with pain of the left foot.  Pain has been present since hitting his foot on a bench while walking down the stairs three days ago.  He states that the pain has been constant since that time.  Pain worse in the area of the left great toe.  He has been able to ambulate, but states that the pain is worse with ambulation.  He has been taking OTC NSAIDs for the pain with mild relief.  He denies numbness or tingling of the foot or any other symptoms.        Past Medical History:  Diagnosis Date  . Arthritis   . Bulging lumbar disc   . Depression   . Kidney stone   . Kidney stones   . Renal disorder     Patient Active Problem List   Diagnosis Date Noted  . Other convulsions 01/04/2013  . NEPHROLITHIASIS, HX OF 04/15/2010  . ABDOMINAL PAIN, LEFT LOWER QUADRANT 04/01/2010    Past Surgical History:  Procedure Laterality Date  . LITHOTRIPSY         Home Medications    Prior to Admission medications   Medication Sig Start Date End Date Taking? Authorizing Provider  GABAPENTIN PO Take by mouth.    Historical Provider, MD  ondansetron (ZOFRAN) 4 MG tablet Take 1 tablet (4 mg total) by mouth every 6 (six) hours. 10/10/15   Jacalyn LefevreJulie Haviland, MD    Family History No family history on file.  Social History Social History  Substance Use Topics  . Smoking status: Current Every Day Smoker    Packs/day: 0.50    Years: 7.00    Types: Cigarettes  . Smokeless tobacco: Never Used  . Alcohol use Yes     Comment: occ     Allergies   Review of patient's allergies indicates no known allergies.   Review of Systems Review of Systems  All other systems reviewed and are negative.    Physical Exam Updated Vital Signs BP 122/68 (BP  Location: Left Arm)   Pulse 88   Temp 98 F (36.7 C) (Oral)   Resp 16   Ht 5\' 7"  (1.702 m)   Wt 70.3 kg   SpO2 100%   BMI 24.28 kg/m   Physical Exam  Constitutional: He appears well-developed and well-nourished.  HENT:  Head: Normocephalic and atraumatic.  Neck: Normal range of motion. Neck supple.  Cardiovascular: Normal rate, regular rhythm and normal heart sounds.   Pulses:      Dorsalis pedis pulses are 2+ on the right side, and 2+ on the left side.  Pulmonary/Chest: Effort normal and breath sounds normal.  Musculoskeletal: Normal range of motion.  Tenderness to palpation of the left great toe with mild bruising.  No edema.    Neurological: He is alert.  Distal sensation of the left foot intact  Skin: Skin is warm and dry.  Nursing note and vitals reviewed.    ED Treatments / Results  Labs (all labs ordered are listed, but only abnormal results are displayed) Labs Reviewed - No data to display  EKG  EKG Interpretation None       Radiology Dg Foot Complete Left  Result Date: 11/29/2015 CLINICAL DATA:  Pain over the left first MTP joint after kicking a bench on Tuesday. EXAM: LEFT FOOT - COMPLETE 3+ VIEW COMPARISON:  None. FINDINGS: No fracture or dislocation with special attention paid to the first digit. No hallux valgus deformity. Joint spaces are preserved. No erosions. Regional soft tissues appear normal. No radiopaque foreign body. IMPRESSION: No acute findings with special attention paid to the great toe. Electronically Signed   By: Simonne ComeJohn  Watts M.D.   On: 11/29/2015 12:44    Procedures Procedures (including critical care time)  Medications Ordered in ED Medications - No data to display   Initial Impression / Assessment and Plan / ED Course  I have reviewed the triage vital signs and the nursing notes.  Pertinent labs & imaging results that were available during my care of the patient were reviewed by me and considered in my medical decision making  (see chart for details).  Clinical Course   Patient presents today with foot pain after hitting it on a bench three days ago.  Xray is negative.  Neurovascularly intact.  Patient given post op shoe.  Patient instructed to take NSAIDs.  Stable for discharge.  Return precautions given.    Final Clinical Impressions(s) / ED Diagnoses   Final diagnoses:  None    New Prescriptions New Prescriptions   No medications on file     Santiago GladHeather Taha Dimond, PA-C 11/29/15 2054    Laurence Spatesachel Morgan Little, MD 12/06/15 [phone]

## 2015-11-29 NOTE — ED Notes (Signed)
C/o pain to L big toe and top of L foot after kicking a bench while trying to get his balance. Using ice and motrin at home.

## 2016-01-13 ENCOUNTER — Emergency Department (HOSPITAL_BASED_OUTPATIENT_CLINIC_OR_DEPARTMENT_OTHER): Payer: 59

## 2016-01-13 ENCOUNTER — Emergency Department (HOSPITAL_BASED_OUTPATIENT_CLINIC_OR_DEPARTMENT_OTHER)
Admission: EM | Admit: 2016-01-13 | Discharge: 2016-01-13 | Disposition: A | Discharge status: Home / Self Care | Payer: 59 | Attending: Emergency Medicine | Admitting: Emergency Medicine

## 2016-01-13 ENCOUNTER — Encounter (HOSPITAL_BASED_OUTPATIENT_CLINIC_OR_DEPARTMENT_OTHER): Payer: Self-pay

## 2016-01-13 DIAGNOSIS — F17210 Nicotine dependence, cigarettes, uncomplicated: Secondary | ICD-10-CM | POA: Insufficient documentation

## 2016-01-13 DIAGNOSIS — R1032 Left lower quadrant pain: Secondary | ICD-10-CM | POA: Diagnosis present

## 2016-01-13 LAB — URINALYSIS, ROUTINE W REFLEX MICROSCOPIC
BILIRUBIN URINE: NEGATIVE
Glucose, UA: NEGATIVE mg/dL
HGB URINE DIPSTICK: NEGATIVE
KETONES UR: NEGATIVE mg/dL
Leukocytes, UA: NEGATIVE
Nitrite: NEGATIVE
Protein, ur: NEGATIVE mg/dL
SPECIFIC GRAVITY, URINE: 1.013 (ref 1.005–1.030)
pH: 7.5 (ref 5.0–8.0)

## 2016-01-13 LAB — BASIC METABOLIC PANEL
Anion gap: 6 (ref 5–15)
BUN: 10 mg/dL (ref 6–20)
CALCIUM: 9 mg/dL (ref 8.9–10.3)
CHLORIDE: 104 mmol/L (ref 101–111)
CO2: 29 mmol/L (ref 22–32)
CREATININE: 1.03 mg/dL (ref 0.61–1.24)
GFR calc Af Amer: >60 mL/min (ref >60)
Glucose, Bld: 108 mg/dL — ABNORMAL HIGH (ref 65–99)
Potassium: 3.7 mmol/L (ref 3.5–5.1)
SODIUM: 139 mmol/L (ref 135–145)

## 2016-01-13 MED ORDER — MORPHINE SULFATE (PF) 4 MG/ML IV SOLN
4.0000 mg | Freq: Once | INTRAVENOUS | Status: AC
  Administered 2016-01-13: 4 mg via INTRAVENOUS
  Filled 2016-01-13: qty 1

## 2016-01-13 MED ORDER — SODIUM CHLORIDE 0.9 % IV BOLUS (SEPSIS)
500.0000 mL | Freq: Once | INTRAVENOUS | Status: AC
  Administered 2016-01-13: 500 mL via INTRAVENOUS

## 2016-01-13 MED ORDER — TRAMADOL HCL 50 MG PO TABS: 50.0000 mg | ORAL_TABLET | Freq: Four times a day (QID) | ORAL | 0 refills | Status: DC | PRN

## 2016-01-13 MED ORDER — ONDANSETRON 4 MG PO TBDP: ORAL_TABLET | ORAL | 0 refills | Status: DC

## 2016-01-13 MED ORDER — KETOROLAC TROMETHAMINE 30 MG/ML IJ SOLN
30.0000 mg | Freq: Once | INTRAMUSCULAR | Status: AC
  Administered 2016-01-13: 30 mg via INTRAVENOUS
  Filled 2016-01-13: qty 1

## 2016-01-13 MED ORDER — ONDANSETRON HCL 4 MG/2ML IJ SOLN
4.0000 mg | Freq: Once | INTRAMUSCULAR | Status: AC
  Administered 2016-01-13: 4 mg via INTRAVENOUS
  Filled 2016-01-13: qty 2

## 2016-01-13 NOTE — ED Triage Notes (Signed)
C/o labd pain, left flank pain, n/v/d x 3 days-presents  to triage in w/c

## 2016-01-13 NOTE — ED Notes (Signed)
Discontinued IV Per RN

## 2016-01-13 NOTE — ED Notes (Signed)
Pt given ginger ale for a PO challenge. 

## 2016-01-13 NOTE — ED Provider Notes (Signed)
[facility] Provider Note   CSN: [id number] Arrival date & time: 01/13/16  1639   By signing my name below, I, Teofilo Pod, attest that this documentation has been prepared under the direction and in the presence of Rolan Bucco, MD . Electronically Signed: Teofilo Pod, ED Scribe. 01/13/2016. 4:59 PM.   History   Chief Complaint Chief Complaint  Patient presents with  . Abdominal Pain     The history is provided by the patient. No language interpreter was used.   HPI Comments:  Gregory Flores is a 35 y.o. male with PMHx of kidney stones and renal disorder who presents to the Emergency Department complaining of L flank pain x3 days. Pt states that his side feels like it is being "cut open." Pt reports associated hematuria, dysuria, mild testicular pain, nausea, vomiting (multiple episodes) and diarrhea. Pt has a urology appointment at [facility] this month. Pt has taken ibuprofen and tylenol with little to no relief. Pt denies fever.  Past Medical History:  Diagnosis Date  . Arthritis   . Bulging lumbar disc   . Depression   . Kidney stone   . Kidney stones   . Renal disorder     Patient Active Problem List   Diagnosis Date Noted  . Other convulsions 01/04/2013  . NEPHROLITHIASIS, HX OF 04/15/2010  . ABDOMINAL PAIN, LEFT LOWER QUADRANT 04/01/2010    Past Surgical History:  Procedure Laterality Date  . LITHOTRIPSY         Home Medications    Prior to Admission medications   Medication Sig Start Date End Date Taking? Authorizing Provider  ClonazePAM (KLONOPIN PO) Take by mouth.   Yes Historical Provider, MD  DULOXETINE HCL PO Take by mouth.   Yes Historical Provider, MD  Prazosin HCl (MINIPRESS PO) Take by mouth.   Yes Historical Provider, MD  GABAPENTIN PO Take by mouth.    Historical Provider, MD  ondansetron (ZOFRAN ODT) 4 MG disintegrating tablet 4mg  ODT q4 hours prn nausea/vomit 01/13/16   Rolan Bucco, MD  ondansetron (ZOFRAN) 4  MG tablet Take 1 tablet (4 mg total) by mouth every 6 (six) hours. 10/10/15   Jacalyn Lefevre, MD  traMADol (ULTRAM) 50 MG tablet Take 1 tablet (50 mg total) by mouth every 6 (six) hours as needed. 01/13/16   Rolan Bucco, MD    Family History No family history on file.  Social History Social History  Substance Use Topics  . Smoking status: Current Every Day Smoker    Packs/day: 0.50    Years: 7.00    Types: Cigarettes  . Smokeless tobacco: Never Used  . Alcohol use No     Allergies   Review of patient's allergies indicates no known allergies.   Review of Systems Review of Systems  Constitutional: Negative for chills, diaphoresis and fatigue.  HENT: Negative for congestion, rhinorrhea and sneezing.   Eyes: Negative.   Respiratory: Negative for cough, chest tightness and shortness of breath.   Cardiovascular: Negative for chest pain and leg swelling.  Gastrointestinal: Negative for blood in stool.  Genitourinary: Positive for flank pain and testicular pain. Negative for difficulty urinating.  Musculoskeletal: Negative for back pain.  Skin: Negative for rash.  Neurological: Negative for dizziness, speech difficulty, weakness and numbness.     Physical Exam Updated Vital Signs BP 129/82 (BP Location: Left Wrist)   Pulse 70   Temp 98 F (36.7 C) (Oral)   Resp 18   Ht 5\' 9"  (1.753 m)  Wt 155 lb (70.3 kg)   SpO2 98%   BMI 22.89 kg/m   Physical Exam  Constitutional: He is oriented to person, place, and time. He appears well-developed and well-nourished.  HENT:  Head: Normocephalic and atraumatic.  Eyes: Pupils are equal, round, and reactive to light.  Neck: Normal range of motion. Neck supple.  Cardiovascular: Normal rate, regular rhythm and normal heart sounds.   Pulmonary/Chest: Effort normal and breath sounds normal. No respiratory distress. He has no wheezes. He has no rales. He exhibits no tenderness.  Abdominal: Soft. Bowel sounds are normal. There is no  tenderness. There is no rebound and no guarding.  Pain in L mid and lower abdomen. No CVA tenderness.  Genitourinary:  Genitourinary Comments: No pain to the testicles or inguinal areas  Musculoskeletal: Normal range of motion. He exhibits no edema.  Lymphadenopathy:    He has no cervical adenopathy.  Neurological: He is alert and oriented to person, place, and time.  Skin: Skin is warm and dry. No rash noted.  Psychiatric: He has a normal mood and affect.     ED Treatments / Results  DIAGNOSTIC STUDIES:  Oxygen Saturation is 95% on RA, normal by my interpretation.    COORDINATION OF CARE:  4:59 PM Discussed treatment plan with pt at bedside and pt agreed to plan.   Labs (all labs ordered are listed, but only abnormal results are displayed) Results for orders placed or performed during the hospital encounter of 01/13/16  Basic metabolic panel  Result Value Ref Range   Sodium 139 135 - 145 mmol/L   Potassium 3.7 3.5 - 5.1 mmol/L   Chloride 104 101 - 111 mmol/L   CO2 29 22 - 32 mmol/L   Glucose, Bld 108 (H) 65 - 99 mg/dL   BUN 10 6 - 20 mg/dL   Creatinine, Ser 1.611.03 0.61 - 1.24 mg/dL   Calcium 9.0 8.9 - 09.610.3 mg/dL   GFR calc non Af Amer >60 >60 mL/min   GFR calc Af Amer >60 >60 mL/min   Anion gap 6 5 - 15  Urinalysis, Routine w reflex microscopic  Result Value Ref Range   Color, Urine YELLOW YELLOW   APPearance CLOUDY (A) CLEAR   Specific Gravity, Urine 1.013 1.005 - 1.030   pH 7.5 5.0 - 8.0   Glucose, UA NEGATIVE NEGATIVE mg/dL   Hgb urine dipstick NEGATIVE NEGATIVE   Bilirubin Urine NEGATIVE NEGATIVE   Ketones, ur NEGATIVE NEGATIVE mg/dL   Protein, ur NEGATIVE NEGATIVE mg/dL   Nitrite NEGATIVE NEGATIVE   Leukocytes, UA NEGATIVE NEGATIVE   Dg Abdomen 1 View  Result Date: 01/13/2016 CLINICAL DATA:  Left flank pain for 3 days. History of urinary tract stones. EXAM: ABDOMEN - 1 VIEW COMPARISON:  CT abdomen and pelvis 10/10/2015. FINDINGS: The bowel gas pattern is  normal. No radio-opaque calculi or other significant radiographic abnormality are seen. IMPRESSION: Negative exam. Electronically Signed   By: Drusilla Kannerhomas  Dalessio M.D.   On: 01/13/2016 17:34     EKG  EKG Interpretation None       Radiology Dg Abdomen 1 View  Result Date: 01/13/2016 CLINICAL DATA:  Left flank pain for 3 days. History of urinary tract stones. EXAM: ABDOMEN - 1 VIEW COMPARISON:  CT abdomen and pelvis 10/10/2015. FINDINGS: The bowel gas pattern is normal. No radio-opaque calculi or other significant radiographic abnormality are seen. IMPRESSION: Negative exam. Electronically Signed   By: Drusilla Kannerhomas  Dalessio M.D.   On: 01/13/2016 17:34  Procedures Procedures (including critical care time)  Medications Ordered in ED Medications  ketorolac (TORADOL) 30 MG/ML injection 30 mg (30 mg Intravenous Given 01/13/16 1719)  morphine 4 MG/ML injection 4 mg (4 mg Intravenous Given 01/13/16 1719)  ondansetron (ZOFRAN) injection 4 mg (4 mg Intravenous Given 01/13/16 1719)  sodium chloride 0.9 % bolus 500 mL (0 mLs Intravenous Stopped 01/13/16 1826)  morphine 4 MG/ML injection 4 mg (4 mg Intravenous Given 01/13/16 1836)     Initial Impression / Assessment and Plan / ED Course  I have reviewed the triage vital signs and the nursing notes.  Pertinent labs & imaging results that were available during my care of the patient were reviewed by me and considered in my medical decision making (see chart for details).  Clinical Course    Patient presents with left flank pain. He states it's the same pain these had previously with his kidney stones. However his urine is negative for blood. There is no signs of infection. A 1 view abdomen did not show an obvious kidney stone. I don't feel that he needs further imaging studies at this point. He's had multiple scans in the past. His last recent CT scans have not shown any passing stones. His pain is controlled in the ED. He doesn't have any ongoing pain to  his testicles. He has upcoming appointments with a urologist at [facility]. He also is scheduling an appointment with a gastroenterologist for his ongoing episodes of abdominal pain. He was advised to follow-up with these physicians and return here as needed for any worsening symptoms.  Final Clinical Impressions(s) / ED Diagnoses   Final diagnoses:  Left lower quadrant pain    New Prescriptions Discharge Medication List as of 01/13/2016  8:19 PM    START taking these medications   Details  ondansetron (ZOFRAN ODT) 4 MG disintegrating tablet 4mg  ODT q4 hours prn nausea/vomit, Print    traMADol (ULTRAM) 50 MG tablet Take 1 tablet (50 mg total) by mouth every 6 (six) hours as needed., Starting Mon 01/13/2016, Print      I personally performed the services described in this documentation, which was scribed in my presence.  The recorded information has been reviewed and considered.     Rolan Bucco, MD 01/13/16 [phone]

## 2016-01-22 ENCOUNTER — Emergency Department (HOSPITAL_BASED_OUTPATIENT_CLINIC_OR_DEPARTMENT_OTHER): Payer: 59

## 2016-01-22 ENCOUNTER — Emergency Department (HOSPITAL_BASED_OUTPATIENT_CLINIC_OR_DEPARTMENT_OTHER)
Admission: EM | Admit: 2016-01-22 | Discharge: 2016-01-22 | Disposition: A | Discharge status: Home / Self Care | Payer: 59 | Attending: Physician Assistant | Admitting: Physician Assistant

## 2016-01-22 ENCOUNTER — Encounter (HOSPITAL_BASED_OUTPATIENT_CLINIC_OR_DEPARTMENT_OTHER): Payer: Self-pay | Admitting: Emergency Medicine

## 2016-01-22 DIAGNOSIS — Z79899 Other long term (current) drug therapy: Secondary | ICD-10-CM | POA: Diagnosis not present

## 2016-01-22 DIAGNOSIS — F17210 Nicotine dependence, cigarettes, uncomplicated: Secondary | ICD-10-CM | POA: Diagnosis not present

## 2016-01-22 DIAGNOSIS — R109 Unspecified abdominal pain: Secondary | ICD-10-CM | POA: Diagnosis not present

## 2016-01-22 HISTORY — DX: Sleep terrors (night terrors): F51.4

## 2016-01-22 LAB — CBC WITH DIFFERENTIAL/PLATELET
BASOS PCT: 0 %
Basophils Absolute: 0 10*3/uL (ref 0.0–0.1)
EOS ABS: 0.1 10*3/uL (ref 0.0–0.7)
Eosinophils Relative: 1 %
HCT: 45.7 % (ref 39.0–52.0)
Hemoglobin: 16 g/dL (ref 13.0–17.0)
Lymphocytes Relative: 28 %
Lymphs Abs: 2.5 10*3/uL (ref 0.7–4.0)
MCH: 31.9 pg (ref 26.0–34.0)
MCHC: 35 g/dL (ref 30.0–36.0)
MCV: 91.2 fL (ref 78.0–100.0)
MONO ABS: 0.7 10*3/uL (ref 0.1–1.0)
MONOS PCT: 8 %
NEUTROS ABS: 5.4 10*3/uL (ref 1.7–7.7)
NEUTROS PCT: 63 %
Platelets: 317 10*3/uL (ref 150–400)
RBC: 5.01 MIL/uL (ref 4.22–5.81)
RDW: 12.6 % (ref 11.5–15.5)
WBC: 8.8 10*3/uL (ref 4.0–10.5)

## 2016-01-22 LAB — URINALYSIS, ROUTINE W REFLEX MICROSCOPIC
BILIRUBIN URINE: NEGATIVE
Glucose, UA: NEGATIVE mg/dL
Hgb urine dipstick: NEGATIVE
KETONES UR: NEGATIVE mg/dL
LEUKOCYTES UA: NEGATIVE
NITRITE: NEGATIVE
PROTEIN: NEGATIVE mg/dL
Specific Gravity, Urine: 1.013 (ref 1.005–1.030)
pH: 6.5 (ref 5.0–8.0)

## 2016-01-22 LAB — COMPREHENSIVE METABOLIC PANEL
ALBUMIN: 4.5 g/dL (ref 3.5–5.0)
ALK PHOS: 62 U/L (ref 38–126)
ALT: 14 U/L — AB (ref 17–63)
ANION GAP: 10 (ref 5–15)
AST: 17 U/L (ref 15–41)
BILIRUBIN TOTAL: 0.7 mg/dL (ref 0.3–1.2)
BUN: 14 mg/dL (ref 6–20)
CALCIUM: 9.3 mg/dL (ref 8.9–10.3)
CO2: 24 mmol/L (ref 22–32)
CREATININE: 0.91 mg/dL (ref 0.61–1.24)
Chloride: 104 mmol/L (ref 101–111)
GFR calc non Af Amer: >60 mL/min (ref >60)
GLUCOSE: 83 mg/dL (ref 65–99)
Potassium: 3.9 mmol/L (ref 3.5–5.1)
SODIUM: 138 mmol/L (ref 135–145)
TOTAL PROTEIN: 7.8 g/dL (ref 6.5–8.1)

## 2016-01-22 MED ORDER — OXYCODONE-ACETAMINOPHEN 5-325 MG PO TABS
1.0000 | ORAL_TABLET | Freq: Once | ORAL | Status: AC
  Administered 2016-01-22: 1 via ORAL
  Filled 2016-01-22: qty 1

## 2016-01-22 MED ORDER — SODIUM CHLORIDE 0.9 % IV BOLUS (SEPSIS): 1000.0000 mL | Freq: Once | INTRAVENOUS | Status: DC

## 2016-01-22 NOTE — Discharge Instructions (Signed)
You were seen today with left flank pain. We did not find any obstructing stone or reason to have increasing pain in your flank. Your urine was normal showed no signs of infection. Please follow-up with your urologist. You were just given tramadol, we encourage you to use that for pain control as well as ibuprofen and Tylenol at home.

## 2016-01-22 NOTE — ED Notes (Signed)
Pt's family member verbalizes understanding of d/c instructions and denies any further need at this time.

## 2016-01-22 NOTE — ED Notes (Signed)
MD at bedside. 

## 2016-01-22 NOTE — ED Provider Notes (Signed)
[facility] Note   CSN: [id number] Arrival date & time: 01/22/16  1717   By signing my name below, I, Avnee Patel, attest that this documentation has been prepared under the direction and in the presence of Earla Charlie Randall An, MD  Electronically Signed: Clovis Pu, ED Scribe. 01/22/16. 6:29 PM.   History   Chief Complaint Chief Complaint  Patient presents with  . Flank Pain    The history is provided by the patient. No language interpreter was used.   HPI Comments:  Deborah Dondero is a 35 y.o. male, with a hx of kidney stones, who presents to the Emergency Department complaining of intermittent sharp left sided flank pain x 3 days. Pt notes associated dysuria and trouble urinating. He was in ED for similar symptoms x 1 week. He notes his symptoms today are consistent with symptoms he has experienced with kidney stones. Pt is followed by a urologist but notes he has switched multiple urologists for unclear reasons. He has a follow up appointment schedule for this week. Pt denies fevers, penile discharge.   Past Medical History:  Diagnosis Date  . Arthritis   . Bulging lumbar disc   . Depression   . Kidney stone   . Kidney stones   . Night terrors, adult   . Renal disorder     Patient Active Problem List   Diagnosis Date Noted  . Other convulsions 01/04/2013  . NEPHROLITHIASIS, HX OF 04/15/2010  . ABDOMINAL PAIN, LEFT LOWER QUADRANT 04/01/2010    Past Surgical History:  Procedure Laterality Date  . BLADDER STONE REMOVAL    . LITHOTRIPSY       Home Medications    Prior to Admission medications   Medication Sig Start Date End Date Taking? Authorizing Provider  ClonazePAM (KLONOPIN PO) Take by mouth.    Historical Provider, MD  DULOXETINE HCL PO Take by mouth.    Historical Provider, MD  GABAPENTIN PO Take by mouth.    Historical Provider, MD  ondansetron (ZOFRAN ODT) 4 MG disintegrating tablet 4mg  ODT q4 hours prn nausea/vomit 01/13/16    Rolan Bucco, MD  ondansetron (ZOFRAN) 4 MG tablet Take 1 tablet (4 mg total) by mouth every 6 (six) hours. 10/10/15   Jacalyn Lefevre, MD  Prazosin HCl (MINIPRESS PO) Take by mouth.    Historical Provider, MD  traMADol (ULTRAM) 50 MG tablet Take 1 tablet (50 mg total) by mouth every 6 (six) hours as needed. 01/13/16   Rolan Bucco, MD    Family History No family history on file.  Social History Social History  Substance Use Topics  . Smoking status: Current Every Day Smoker    Packs/day: 0.50    Years: 7.00    Types: Cigarettes  . Smokeless tobacco: Never Used  . Alcohol use No     Allergies   Review of patient's allergies indicates no known allergies.   Review of Systems Review of Systems  Constitutional: Negative for fever.  Gastrointestinal: Negative for abdominal pain.  Genitourinary: Positive for dysuria. Negative for discharge.  Musculoskeletal: Positive for myalgias.  All other systems reviewed and are negative.    Physical Exam Updated Vital Signs BP 133/91 (BP Location: Left Arm)   Pulse 66   Temp 97.7 F (36.5 C) (Oral)   Resp 20   Ht 5\' 8"  (1.727 m)   Wt 140 lb (63.5 kg)   SpO2 97%   BMI 21.29 kg/m   Physical Exam  Constitutional: He is oriented to person,  place, and time. He appears well-developed and well-nourished. No distress.  HENT:  Head: Normocephalic and atraumatic.  Eyes: Conjunctivae are normal.  Cardiovascular: Normal rate.   Pulmonary/Chest: Effort normal.  Abdominal: He exhibits no distension.  Musculoskeletal: He exhibits no tenderness.  No CVA tenderness. No abdominal tenderness.  Neurological: He is alert and oriented to person, place, and time.  Skin: Skin is warm and dry.  Psychiatric: He has a normal mood and affect.  Nursing note and vitals reviewed.    ED Treatments / Results  DIAGNOSTIC STUDIES:  Oxygen Saturation is 100% on RA, normal by my interpretation.    COORDINATION OF CARE:  6:22 PM Discussed treatment  plan with pt at bedside and pt agreed to plan.  Labs (all labs ordered are listed, but only abnormal results are displayed) Labs Reviewed  COMPREHENSIVE METABOLIC PANEL - Abnormal; Notable for the following:       Result Value   ALT 14 (*)    All other components within normal limits  URINE CULTURE  CBC WITH DIFFERENTIAL/PLATELET  URINALYSIS, ROUTINE W REFLEX MICROSCOPIC (NOT AT [facility])    EKG  EKG Interpretation None       Radiology Ct Renal Stone Study  Result Date: 01/22/2016 CLINICAL DATA:  Left flank pain history of kidney stone EXAM: CT ABDOMEN AND PELVIS WITHOUT CONTRAST TECHNIQUE: Multidetector CT imaging of the abdomen and pelvis was performed following the standard protocol without IV contrast. COMPARISON:  01/14/2016, 10/10/2015 FINDINGS: Lower chest: Lung bases clear. Hepatobiliary: Non contrasted appearance of the liver is within normal limits. No calcified gallstones. No biliary dilatation. Pancreas: Unremarkable. No pancreatic ductal dilatation or surrounding inflammatory changes. Spleen: Normal in size without focal abnormality. Adrenals/Urinary Tract: Adrenal glands within normal limits. Punctate nonobstructing stones in the left kidney. Multiple calcified stones in the right kidney, largest is seen within the midpole and measures 4 mm. No hydronephrosis. No hydroureter. Urinary bladder is within normal limits. Stomach/Bowel: Stomach is nondilated. No dilated large or small bowel. Appendix is within normal limits. Moderate stool in the colon. Vascular/Lymphatic: Non aneurysmal aorta. No significant vascular calcification. No grossly enlarged abdominal or pelvic lymph nodes. Reproductive: Prostate is unremarkable. Other: No free air or free fluid. Musculoskeletal: No acute osseous abnormality IMPRESSION: 1. Bilateral nephrolithiasis without evidence for hydronephrosis. No hydroureter and no stones identified along the course of either ureter. 2. No acute intra-abdominal or  pelvic pathology. Electronically Signed   By: Jasmine Pang M.D.   On: 01/22/2016 19:37    Procedures Procedures (including critical care time)  Medications Ordered in ED Medications  sodium chloride 0.9 % bolus 1,000 mL (0 mLs Intravenous Hold 01/22/16 2010)  oxyCODONE-acetaminophen (PERCOCET/ROXICET) 5-325 MG per tablet 1 tablet (1 tablet Oral Given 01/22/16 1932)     Initial Impression / Assessment and Plan / ED Course  I have reviewed the triage vital signs and the nursing notes.  Pertinent labs & imaging results that were available during my care of the patient were reviewed by me and considered in my medical decision making (see chart for details).  Clinical Course   Advised pt, for 3 minutes, to decrease/stop smoking habit for health reasons.   Patient is a 35 year old male presenting with left flank pain. Patient has history of "over 100" kidney stones. Patient has been seen multiple urologists and seeing in ED multiple times both here [facility] and other surrounding ED. Patient has history of multiple providers providing pain medication. Patient most recently seen at [facility]  given tramadol for similar symptoms last week.  We'll test urine. We'll get a CAT scan for obstructive stone.  Patient's CT shows that he does not have obstructive stone or hydronephorsis. He has multiple stones in the kidney itself. Patient is still complaining of pain. However with normal labs, normal vital signs, normal physical exam and a CT that shows no evidence for stones causing pain I cannot find a reason for the patient's pain. We had long discussion with patient, patient's girlfriend at bedside.  Explained that we have reasonably ruled out any kind of dangerous pathology at this point. Patient has not been consistent about following up with urology or his primary care physician. I offered that he should consider t following up again with his primary care physician and discussing pain  management. In looking through patient's files appears that he's been referred to pain management before.  I explained the predicament of of the opioid epidemic in the [place] and that I do not feel comfortable giving any opiates at this time. I encouraged him to use Tylenol or ibuprofen, warm soaks, and the tramadol he already has a prescription for.  I told him to return with any fever, vomiting, or other change symptoms.  Final Clinical Impressions(s) / ED Diagnoses   Final diagnoses:  Flank pain    New Prescriptions Discharge Medication List as of 01/22/2016  9:22 PM    I personally performed the services described in this documentation, which was scribed in my presence. The recorded information has been reviewed and is accurate.      Keylor Rands Randall AnLyn Weslie Pretlow, MD 01/22/16 2251

## 2016-01-22 NOTE — ED Notes (Signed)
While going through pt's discharge instructions with Dr. Corlis LeakMacKuen, it was suggested that pt might need to see a pain specialist.  Per patient's visit last year at nephrology, it was also suggested at that time that pt see pain management.  When this was mentioned to pt's wife, she stated that they told them to "just come to the ED."  Pt also did not follow up this year to his nephrology appointment, and when it was suggested that he follow up with them if they have any continuing concerns for kidney disease, wife stated again that they told them to "just come to the ED."  Pt and wife advised to follow up with PCP for coordination of care.

## 2016-01-22 NOTE — ED Triage Notes (Signed)
Left flank pain x3 days.  Got severe 2 hours ago.  N/V.  Long hx of kidney stones and sts that this is what they have felt like.  Colonoscopy and Endoscopy done last Friday.

## 2016-01-22 NOTE — ED Notes (Signed)
Pt has had half a cup of water.  Informed pt that we still need a urine sample.  Pt was given two full cups of water 45 minutes ago and asked to provide a urine sample.  Pt reminded again and informed that if needed, the doctor can order an in and out catheter.

## 2016-01-24 LAB — URINE CULTURE

## 2016-03-23 ENCOUNTER — Encounter (HOSPITAL_BASED_OUTPATIENT_CLINIC_OR_DEPARTMENT_OTHER): Payer: Self-pay | Admitting: *Deleted

## 2016-03-23 ENCOUNTER — Emergency Department (HOSPITAL_BASED_OUTPATIENT_CLINIC_OR_DEPARTMENT_OTHER): Payer: 59

## 2016-03-23 ENCOUNTER — Emergency Department (HOSPITAL_BASED_OUTPATIENT_CLINIC_OR_DEPARTMENT_OTHER)
Admission: EM | Admit: 2016-03-23 | Discharge: 2016-03-23 | Disposition: A | Discharge status: Home / Self Care | Payer: 59 | Attending: Emergency Medicine | Admitting: Emergency Medicine

## 2016-03-23 DIAGNOSIS — Z79899 Other long term (current) drug therapy: Secondary | ICD-10-CM | POA: Diagnosis not present

## 2016-03-23 DIAGNOSIS — R51 Headache: Secondary | ICD-10-CM | POA: Diagnosis not present

## 2016-03-23 DIAGNOSIS — R079 Chest pain, unspecified: Secondary | ICD-10-CM | POA: Diagnosis not present

## 2016-03-23 DIAGNOSIS — Y939 Activity, unspecified: Secondary | ICD-10-CM | POA: Insufficient documentation

## 2016-03-23 DIAGNOSIS — F17210 Nicotine dependence, cigarettes, uncomplicated: Secondary | ICD-10-CM | POA: Diagnosis not present

## 2016-03-23 DIAGNOSIS — R52 Pain, unspecified: Secondary | ICD-10-CM

## 2016-03-23 DIAGNOSIS — Y92009 Unspecified place in unspecified non-institutional (private) residence as the place of occurrence of the external cause: Secondary | ICD-10-CM | POA: Diagnosis not present

## 2016-03-23 DIAGNOSIS — Y999 Unspecified external cause status: Secondary | ICD-10-CM | POA: Insufficient documentation

## 2016-03-23 DIAGNOSIS — W19XXXA Unspecified fall, initial encounter: Secondary | ICD-10-CM

## 2016-03-23 DIAGNOSIS — W109XXA Fall (on) (from) unspecified stairs and steps, initial encounter: Secondary | ICD-10-CM | POA: Insufficient documentation

## 2016-03-23 DIAGNOSIS — R109 Unspecified abdominal pain: Secondary | ICD-10-CM | POA: Diagnosis not present

## 2016-03-23 DIAGNOSIS — S0990XA Unspecified injury of head, initial encounter: Secondary | ICD-10-CM | POA: Diagnosis present

## 2016-03-23 HISTORY — DX: Other chronic pain: G89.29

## 2016-03-23 HISTORY — DX: Malingerer (conscious simulation): Z76.5

## 2016-03-23 HISTORY — DX: Low back pain, unspecified: M54.50

## 2016-03-23 HISTORY — DX: Low back pain: M54.5

## 2016-03-23 LAB — URINALYSIS, ROUTINE W REFLEX MICROSCOPIC
BILIRUBIN URINE: NEGATIVE
Glucose, UA: NEGATIVE mg/dL
HGB URINE DIPSTICK: NEGATIVE
KETONES UR: NEGATIVE mg/dL
Leukocytes, UA: NEGATIVE
NITRITE: NEGATIVE
PROTEIN: NEGATIVE mg/dL
Specific Gravity, Urine: 1.017 (ref 1.005–1.030)
pH: 6.5 (ref 5.0–8.0)

## 2016-03-23 MED ORDER — ACETAMINOPHEN 500 MG PO TABS
1000.0000 mg | ORAL_TABLET | Freq: Once | ORAL | Status: AC
  Administered 2016-03-23: 1000 mg via ORAL
  Filled 2016-03-23: qty 2

## 2016-03-23 NOTE — ED Notes (Signed)
ED Provider at bedside. 

## 2016-03-23 NOTE — ED Provider Notes (Signed)
[facility] Note   CSN: [id number] Arrival date & time: 03/23/16  1803   By signing my name below, I, Teofilo PodMatthew P. Jamison, attest that this documentation has been prepared under the direction and in the presence of Doug SouSam Clothilde Tippetts, MD . Electronically Signed: Teofilo PodMatthew P. Jamison, ED Scribe. 03/23/2016. 7:53 PM.   History   Chief Complaint Chief Complaint  Patient presents with  . Fall   The history is provided by the patient. No language interpreter was used.   HPI Comments:  Abelino DerrickJason Rash is a 35 y.o. male with PMHx of kidney stones who presents to the Emergency Department s/p fall that occurred 5 hours ago. Pt reports that he had a sharp pain in his left flank that caused him to fall down 7 steps at home. Pt hit his head but did  notlose consciousness. Pt complains of associated headache .His wife reports that his speech was garbled for a few minutes after the fall Pt states that he currently has a kidney stone that he has had now for 2 weeks. Pt's wife reports that the pt was slurring his words after the fall. Pt smokes, and does not use illicit drugs or drink. Pt has a nephrology appointment in 2 weeks. Pt was prescribed demerol by urgent care recently which he is taking currently. No alleviating factors noted. Pt denies abdominal pain.   Past Medical History:  Diagnosis Date  . Arthritis   . Bulging lumbar disc   . Depression   . Kidney stone   . Kidney stones   . Night terrors, adult   . Renal disorder     Patient Active Problem List   Diagnosis Date Noted  . Other convulsions 01/04/2013  . NEPHROLITHIASIS, HX OF 04/15/2010  . ABDOMINAL PAIN, LEFT LOWER QUADRANT 04/01/2010    Past Surgical History:  Procedure Laterality Date  . BLADDER STONE REMOVAL    . LITHOTRIPSY         Home Medications    Prior to Admission medications   Medication Sig Start Date End Date Taking? Authorizing Provider  meperidine (DEMEROL) 50 MG tablet Take 50 mg by  mouth every 4 (four) hours as needed for severe pain.   Yes Historical Provider, MD  ClonazePAM (KLONOPIN PO) Take by mouth.    Historical Provider, MD  DULOXETINE HCL PO Take by mouth.    Historical Provider, MD  GABAPENTIN PO Take by mouth.    Historical Provider, MD  ondansetron (ZOFRAN ODT) 4 MG disintegrating tablet 4mg  ODT q4 hours prn nausea/vomit 01/13/16   Rolan BuccoMelanie Belfi, MD  ondansetron (ZOFRAN) 4 MG tablet Take 1 tablet (4 mg total) by mouth every 6 (six) hours. 10/10/15   Jacalyn LefevreJulie Haviland, MD  Prazosin HCl (MINIPRESS PO) Take by mouth.    Historical Provider, MD  traMADol (ULTRAM) 50 MG tablet Take 1 tablet (50 mg total) by mouth every 6 (six) hours as needed. 01/13/16   Rolan BuccoMelanie Belfi, MD    Family History History reviewed. No pertinent family history.  Social History Social History  Substance Use Topics  . Smoking status: Current Every Day Smoker    Packs/day: 0.50    Years: 7.00    Types: Cigarettes  . Smokeless tobacco: Never Used  . Alcohol use No     Allergies   Patient has no known allergies.   Review of Systems Review of Systems  Constitutional: Negative.   HENT: Negative.   Respiratory: Negative.   Cardiovascular: Positive for chest pain.  Syncope  Gastrointestinal: Negative.   Genitourinary: Positive for flank pain.       Left flank pain  Skin: Negative.   Allergic/Immunologic: Negative.   Neurological: Positive for headaches.  Psychiatric/Behavioral: Negative.   All other systems reviewed and are negative.    Physical Exam Updated Vital Signs BP 106/77 (BP Location: Left Arm)   Pulse 93   Temp 98.1 F (36.7 C)   Resp 16   Ht 5\' 7"  (1.702 m)   Wt 150 lb (68 kg)   SpO2 98%   BMI 23.49 kg/m   Physical Exam  Constitutional: He is oriented to person, place, and time. He appears well-developed and well-nourished. No distress.  Alert Glasgow Coma Score 15  HENT:  Head: Normocephalic and atraumatic.  Right Ear: External ear normal.  Left  Ear: External ear normal.  Eyes: Conjunctivae are normal. Pupils are equal, round, and reactive to light.  Neck: Neck supple. No tracheal deviation present. No thyromegaly present.  Cardiovascular: Normal rate and regular rhythm.   No murmur heard. Pulmonary/Chest: Effort normal and breath sounds normal.  Abdominal: Soft. Bowel sounds are normal. He exhibits no distension. There is no tenderness.  Musculoskeletal: Normal range of motion. He exhibits no edema or tenderness.  entire spine nontender. Pelvis stable nontender. All 4 extremities without contusion abrasion or tenderness neurovascularly intact  Neurological: He is alert and oriented to person, place, and time. Coordination normal.  Gait normal motor strength 5 over 5 overall  Skin: Skin is warm and dry. No rash noted.  Psychiatric: He has a normal mood and affect.  Nursing note and vitals reviewed.    ED Treatments / Results  DIAGNOSTIC STUDIES:  Oxygen Saturation is 98% on RA, normal by my interpretation.    COORDINATION OF CARE:  7:53 PM Discussed treatment plan with pt at bedside and pt agreed to plan.   Labs (all labs ordered are listed, but only abnormal results are displayed) Labs Reviewed - No data to display X-rays viewed by me Results for orders placed or performed during the hospital encounter of 03/23/16  Urinalysis, Routine w reflex microscopic  Result Value Ref Range   Color, Urine YELLOW YELLOW   APPearance CLOUDY (A) CLEAR   Specific Gravity, Urine 1.017 1.005 - 1.030   pH 6.5 5.0 - 8.0   Glucose, UA NEGATIVE NEGATIVE mg/dL   Hgb urine dipstick NEGATIVE NEGATIVE   Bilirubin Urine NEGATIVE NEGATIVE   Ketones, ur NEGATIVE NEGATIVE mg/dL   Protein, ur NEGATIVE NEGATIVE mg/dL   Nitrite NEGATIVE NEGATIVE   Leukocytes, UA NEGATIVE NEGATIVE   Dg Cervical Spine Complete  Result Date: 03/23/2016 CLINICAL DATA:  Fall down multiple stairs with neck pain, initial encounter EXAM: CERVICAL SPINE - COMPLETE 4+  VIEW COMPARISON:  None. FINDINGS: Seven cervical segments are well visualized. Vertebral body height is well maintained. Alignment is well maintained. Neural foramina are widely patent. The odontoid is within normal limits. No soft tissue abnormality is seen. Density is noted overlying the anterior aspect of the C5-6 disc space on the lateral film only. This may represent some chronic change. No other focal abnormality is seen. IMPRESSION: No acute abnormality noted. Electronically Signed   By: Alcide Clever M.D.   On: 03/23/2016 20:49   Dg Lumbar Spine Complete  Result Date: 03/23/2016 CLINICAL DATA:  35 year old male with left flank pain. History of kidney stones. EXAM: LUMBAR SPINE - COMPLETE 4+ VIEW COMPARISON:  CT dated 01/22/2016 FINDINGS: There is no acute fracture or subluxation  of the lumbar spine. The vertebral body heights and disc spaces are maintained. The visualized transverse and spinous processes appear intact. Multiple small radiopaque foci over the right renal silhouette likely representing stones. There is moderate stool throughout the colon. IMPRESSION: Unremarkable lumbar spine radiograph. Small right renal calculi. Electronically Signed   By: Elgie Collard M.D.   On: 03/23/2016 23:04   Ct Head Wo Contrast  Result Date: 03/23/2016 CLINICAL DATA:  Recent fall down stairs with headaches EXAM: CT HEAD WITHOUT CONTRAST TECHNIQUE: Contiguous axial images were obtained from the base of the skull through the vertex without intravenous contrast. COMPARISON:  12/18/2015 FINDINGS: Brain: No evidence of acute infarction, hemorrhage, hydrocephalus, extra-axial collection or mass lesion/mass effect. Vascular: No hyperdense vessel or unexpected calcification. Skull: Normal. Negative for fracture or focal lesion. Sinuses/Orbits: No acute finding. Other: None. IMPRESSION: No acute intracranial abnormality noted. Electronically Signed   By: Alcide Clever M.D.   On: 03/23/2016 20:43   EKG  EKG  Interpretation None      Patient has had 9 CT scans all negative for obstructing stones since 2015. [place] controlled substance database inquiry queried. He hass received prescription for opioid pain medicine and for benzodiazepines monthly for the past 4 months. Radiology No results found.  Procedures Procedures (including critical care time)  Medications Ordered in ED Medications - No data to display Results for orders placed or performed during the hospital encounter of 03/23/16  Urinalysis, Routine w reflex microscopic  Result Value Ref Range   Color, Urine YELLOW YELLOW   APPearance CLOUDY (A) CLEAR   Specific Gravity, Urine 1.017 1.005 - 1.030   pH 6.5 5.0 - 8.0   Glucose, UA NEGATIVE NEGATIVE mg/dL   Hgb urine dipstick NEGATIVE NEGATIVE   Bilirubin Urine NEGATIVE NEGATIVE   Ketones, ur NEGATIVE NEGATIVE mg/dL   Protein, ur NEGATIVE NEGATIVE mg/dL   Nitrite NEGATIVE NEGATIVE   Leukocytes, UA NEGATIVE NEGATIVE   Dg Cervical Spine Complete  Result Date: 03/23/2016 CLINICAL DATA:  Fall down multiple stairs with neck pain, initial encounter EXAM: CERVICAL SPINE - COMPLETE 4+ VIEW COMPARISON:  None. FINDINGS: Seven cervical segments are well visualized. Vertebral body height is well maintained. Alignment is well maintained. Neural foramina are widely patent. The odontoid is within normal limits. No soft tissue abnormality is seen. Density is noted overlying the anterior aspect of the C5-6 disc space on the lateral film only. This may represent some chronic change. No other focal abnormality is seen. IMPRESSION: No acute abnormality noted. Electronically Signed   By: Alcide Clever M.D.   On: 03/23/2016 20:49   Dg Lumbar Spine Complete  Result Date: 03/23/2016 CLINICAL DATA:  35 year old male with left flank pain. History of kidney stones. EXAM: LUMBAR SPINE - COMPLETE 4+ VIEW COMPARISON:  CT dated 01/22/2016 FINDINGS: There is no acute fracture or subluxation of the lumbar  spine. The vertebral body heights and disc spaces are maintained. The visualized transverse and spinous processes appear intact. Multiple small radiopaque foci over the right renal silhouette likely representing stones. There is moderate stool throughout the colon. IMPRESSION: Unremarkable lumbar spine radiograph. Small right renal calculi. Electronically Signed   By: Elgie Collard M.D.   On: 03/23/2016 23:04   Ct Head Wo Contrast  Result Date: 03/23/2016 CLINICAL DATA:  Recent fall down stairs with headaches EXAM: CT HEAD WITHOUT CONTRAST TECHNIQUE: Contiguous axial images were obtained from the base of the skull through the vertex without intravenous contrast. COMPARISON:  12/18/2015 FINDINGS: Brain:  No evidence of acute infarction, hemorrhage, hydrocephalus, extra-axial collection or mass lesion/mass effect. Vascular: No hyperdense vessel or unexpected calcification. Skull: Normal. Negative for fracture or focal lesion. Sinuses/Orbits: No acute finding. Other: None. IMPRESSION: No acute intracranial abnormality noted. Electronically Signed   By: Alcide CleverMark  Lukens M.D.   On: 03/23/2016 20:43    Initial Impression / Assessment and Plan / ED Course  I have reviewed the triage vital signs and the nursing notes.  Pertinent labs & imaging results that were available during my care of the patient were reviewed by me and considered in my medical decision making (see chart for details).  Clinical Course     11:20 PM patient reports no relief after treatment with Tylenol. X-rays viewed by me. Patient is stable for discharge. He reports that he has Demerol at home. He is instructed to follow-up with his primary care physician tomorrow. I suggest referral to pain clinic. There is no external evidence or radiographic evidence of injury. Final Clinical Impressions(s) / ED Diagnoses  Diagnoses #1 fall #2 chronic pain Final diagnoses:  None    New Prescriptions New Prescriptions   No medications on file         Doug SouSam Calaya Gildner, MD 03/23/16 2328

## 2016-03-23 NOTE — Discharge Instructions (Signed)
Take Tylenol or Advil for pain. Contact your primary care physician tomorrow to arrange to get seen in the office. Ask your primary care physician for referral to a pain clinic

## 2016-03-23 NOTE — ED Notes (Signed)
Pt called for 2nd time and was found sleeping in lobby. Pt escorted to room.

## 2016-03-23 NOTE — ED Notes (Signed)
Called to treatment room with no answer from lobby 

## 2016-03-23 NOTE — ED Notes (Signed)
Pt called to room x 1 no answer

## 2016-03-23 NOTE — ED Notes (Signed)
EDP and RN notified.

## 2016-03-23 NOTE — ED Triage Notes (Addendum)
Pt c/o fall down stairs x 3 hrs ago  Wife states he is taking demerol po for kidney stones, pt has pen point pupils and unable to open eyes, very sleepy , wife answering questions

## 2016-05-07 DIAGNOSIS — J181 Lobar pneumonia, unspecified organism: Secondary | ICD-10-CM | POA: Diagnosis not present

## 2016-05-15 DIAGNOSIS — R110 Nausea: Secondary | ICD-10-CM | POA: Diagnosis not present

## 2016-05-15 DIAGNOSIS — R6889 Other general symptoms and signs: Secondary | ICD-10-CM | POA: Diagnosis not present

## 2016-05-15 DIAGNOSIS — J180 Bronchopneumonia, unspecified organism: Secondary | ICD-10-CM | POA: Diagnosis not present

## 2016-05-25 ENCOUNTER — Emergency Department (HOSPITAL_BASED_OUTPATIENT_CLINIC_OR_DEPARTMENT_OTHER)
Admission: EM | Admit: 2016-05-25 | Discharge: 2016-05-25 | Disposition: A | Discharge status: Home / Self Care | Payer: 59 | Attending: Emergency Medicine | Admitting: Emergency Medicine

## 2016-05-25 ENCOUNTER — Encounter (HOSPITAL_BASED_OUTPATIENT_CLINIC_OR_DEPARTMENT_OTHER): Payer: Self-pay | Admitting: *Deleted

## 2016-05-25 DIAGNOSIS — F17210 Nicotine dependence, cigarettes, uncomplicated: Secondary | ICD-10-CM | POA: Insufficient documentation

## 2016-05-25 DIAGNOSIS — Z79899 Other long term (current) drug therapy: Secondary | ICD-10-CM | POA: Diagnosis not present

## 2016-05-25 DIAGNOSIS — R109 Unspecified abdominal pain: Secondary | ICD-10-CM | POA: Diagnosis not present

## 2016-05-25 LAB — URINALYSIS, MICROSCOPIC (REFLEX)

## 2016-05-25 LAB — CBC
HEMATOCRIT: 37.8 % — AB (ref 39.0–52.0)
Hemoglobin: 12.7 g/dL — ABNORMAL LOW (ref 13.0–17.0)
MCH: 31.7 pg (ref 26.0–34.0)
MCHC: 33.6 g/dL (ref 30.0–36.0)
MCV: 94.3 fL (ref 78.0–100.0)
PLATELETS: 196 10*3/uL (ref 150–400)
RBC: 4.01 MIL/uL — ABNORMAL LOW (ref 4.22–5.81)
RDW: 13.3 % (ref 11.5–15.5)
WBC: 6.7 10*3/uL (ref 4.0–10.5)

## 2016-05-25 LAB — BASIC METABOLIC PANEL
Anion gap: 6 (ref 5–15)
BUN: 13 mg/dL (ref 6–20)
CALCIUM: 8.6 mg/dL — AB (ref 8.9–10.3)
CO2: 29 mmol/L (ref 22–32)
CREATININE: 0.95 mg/dL (ref 0.61–1.24)
Chloride: 104 mmol/L (ref 101–111)
GFR calc Af Amer: >60 mL/min (ref >60)
GLUCOSE: 128 mg/dL — AB (ref 65–99)
POTASSIUM: 3.3 mmol/L — AB (ref 3.5–5.1)
SODIUM: 139 mmol/L (ref 135–145)

## 2016-05-25 LAB — URINALYSIS, ROUTINE W REFLEX MICROSCOPIC
Bilirubin Urine: NEGATIVE
Glucose, UA: NEGATIVE mg/dL
Ketones, ur: NEGATIVE mg/dL
LEUKOCYTES UA: NEGATIVE
NITRITE: NEGATIVE
Protein, ur: NEGATIVE mg/dL
SPECIFIC GRAVITY, URINE: 1.014 (ref 1.005–1.030)
pH: 6.5 (ref 5.0–8.0)

## 2016-05-25 MED ORDER — IBUPROFEN 400 MG PO TABS
400.0000 mg | ORAL_TABLET | Freq: Once | ORAL | Status: AC
  Administered 2016-05-25: 400 mg via ORAL
  Filled 2016-05-25: qty 1

## 2016-05-25 MED ORDER — IBUPROFEN 800 MG PO TABS: 800.0000 mg | ORAL_TABLET | Freq: Four times a day (QID) | ORAL | 0 refills | Status: DC | PRN

## 2016-05-25 NOTE — ED Provider Notes (Addendum)
[facility]   CSN: [id number] Arrival date & time: 05/25/16  1304  By signing my name below, I, Rosario Adie, attest that this documentation has been prepared under the direction and in the presence of Lyndal Pulley, MD. Electronically Signed: Rosario Adie, ED Scribe. 05/25/16. 3:39 PM.  History   Chief Complaint Chief Complaint  Patient presents with  . Flank Pain   The history is provided by the patient and medical records. No language interpreter was used.  Flank Pain  This is a recurrent problem. The current episode started more than 2 days ago. The problem occurs constantly. The problem has been gradually worsening. Pertinent negatives include no chest pain, no abdominal pain, no headaches and no shortness of breath. Nothing aggravates the symptoms. Nothing relieves the symptoms. Treatments tried: Ibuprofen. The treatment provided no relief.    HPI Comments: Gregory Flores is a 36 y.o. male with a h/o drug-seeking behavior and prior renal calculi, who presents to the Emergency Department complaining of constant, gradually worsening, left-sided flank pain beginning three days ago. He notes radiation of his pain from his left flank around into his lower abdomen. Pt reports that he has a h/o renal calculi and his pain today is similar to this. Per prior chart review, pt was last seen in the ED for this issue on 01/22/2016 (~4 months ago) and at that time he had a CT A/p performed which was remarkable for a non-obstructive stones of the kidney, but no obstructing calculi or hydronephrosis. He is currently followed by a Urologist. Pt has been taken 400mg  of Ibuprofen without relief of his pain. He denies any difficulty urinating, decreased urine, fever, or any other associated symptoms.   Past Medical History:  Diagnosis Date  . Arthritis   . Bulging lumbar disc   . Chronic lumbar pain   . Depression   . Drug-seeking behavior   . Kidney stone   .  Kidney stones   . Night terrors, adult   . Renal disorder    Patient Active Problem List   Diagnosis Date Noted  . Other convulsions 01/04/2013  . NEPHROLITHIASIS, HX OF 04/15/2010  . ABDOMINAL PAIN, LEFT LOWER QUADRANT 04/01/2010   Past Surgical History:  Procedure Laterality Date  . BLADDER STONE REMOVAL    . LITHOTRIPSY      Home Medications    Prior to Admission medications   Medication Sig Start Date End Date Taking? Authorizing Provider  ClonazePAM (KLONOPIN PO) Take by mouth.    Historical Provider, MD  DULOXETINE HCL PO Take by mouth.    Historical Provider, MD  GABAPENTIN PO Take by mouth.    Historical Provider, MD  meperidine (DEMEROL) 50 MG tablet Take 50 mg by mouth every 4 (four) hours as needed for severe pain.    Historical Provider, MD  ondansetron (ZOFRAN ODT) 4 MG disintegrating tablet 4mg  ODT q4 hours prn nausea/vomit 01/13/16   Rolan Bucco, MD  ondansetron (ZOFRAN) 4 MG tablet Take 1 tablet (4 mg total) by mouth every 6 (six) hours. 10/10/15   Jacalyn Lefevre, MD  Prazosin HCl (MINIPRESS PO) Take by mouth.    Historical Provider, MD  traMADol (ULTRAM) 50 MG tablet Take 1 tablet (50 mg total) by mouth every 6 (six) hours as needed. 01/13/16   Rolan Bucco, MD   Family History No family history on file.  Social History Social History  Substance Use Topics  . Smoking status: Current Every Day Smoker  Packs/day: 0.50    Years: 7.00    Types: Cigarettes  . Smokeless tobacco: Never Used  . Alcohol use No   Allergies   Patient has no known allergies.  Review of Systems Review of Systems  Constitutional: Negative for fever.  Respiratory: Negative for shortness of breath.   Cardiovascular: Negative for chest pain.  Gastrointestinal: Negative for abdominal pain.  Genitourinary: Positive for flank pain. Negative for decreased urine volume and difficulty urinating.  Neurological: Negative for headaches.  All other systems reviewed and are  negative.  Physical Exam Updated Vital Signs BP 118/89 (BP Location: Right Arm)   Pulse 98   Temp 98.4 F (36.9 C) (Oral)   Resp 22   Ht 5\' 7"  (1.702 m)   Wt 163 lb 14.4 oz (74.3 kg)   SpO2 96%   BMI 25.67 kg/m   Physical Exam  Constitutional: He is oriented to person, place, and time. He appears well-developed and well-nourished. No distress.  Appears slightly uncomfortable.   HENT:  Head: Normocephalic and atraumatic.  Nose: Nose normal.  Eyes: Conjunctivae are normal.  Neck: Neck supple. No tracheal deviation present.  Cardiovascular: Normal rate and regular rhythm.   Pulmonary/Chest: Effort normal. No respiratory distress.  Abdominal: Soft. He exhibits no distension. There is no CVA tenderness.  No CVA tenderness.   Musculoskeletal: He exhibits tenderness.  Tenderness over the left mid flank.   Neurological: He is alert and oriented to person, place, and time.  Skin: Skin is warm and dry.  Psychiatric: He has a normal mood and affect.  Nursing note and vitals reviewed.  ED Treatments / Results  DIAGNOSTIC STUDIES: Oxygen Saturation is 96% on RA, normal by my interpretation.   COORDINATION OF CARE: 3:39 PM-Discussed next steps with pt. Pt verbalized understanding and is agreeable with the plan.   Labs (all labs ordered are listed, but only abnormal results are displayed) Labs Reviewed  URINALYSIS, ROUTINE W REFLEX MICROSCOPIC - Abnormal; Notable for the following:       Result Value   Hgb urine dipstick MODERATE (*)    All other components within normal limits  URINALYSIS, MICROSCOPIC (REFLEX) - Abnormal; Notable for the following:    Bacteria, UA RARE (*)    Squamous Epithelial / LPF 0-5 (*)    All other components within normal limits  BASIC METABOLIC PANEL - Abnormal; Notable for the following:    Potassium 3.3 (*)    Glucose, Bld 128 (*)    Calcium 8.6 (*)    All other components within normal limits  CBC - Abnormal; Notable for the following:    RBC  4.01 (*)    Hemoglobin 12.7 (*)    HCT 37.8 (*)    All other components within normal limits   EKG  EKG Interpretation None      Radiology No results found.  Procedures Procedures   EMERGENCY DEPARTMENT US RENAL EXAM  "Study: Limited Retroperitoneal Ultrasound of Kidneys"  INDICATIONS: Flank pain and Hematuria Long and short axis of both kidneys were obtained.   PERFORMED BY: Myself IMAGES ARCHIVED?: Yes LIMITATIONS: None VIEWS USED: Long axis, Short axis and Bladder  INTERPRETATION: No Hydronephrosis     Medications Ordered in ED Medications  ibuprofen (ADVIL,MOTRIN) tablet 400 mg (400 mg Oral Given 05/25/16 1329)   Initial Impression / Assessment and Plan / ED Course  I have reviewed the triage vital signs and the nursing notes.  Pertinent labs & imaging results that were available during my care  of the patient were reviewed by me and considered in my medical decision making (see chart for details).     36 y.o. male presents with recurrent left flank pain. Had similar presentation recently and had CT that showed punctate renal stones without etiology for pain. No hydro today and no large stones noted. I discussed the risks of recurrent CT studies and radiation burden so do not feel this is warranted on today's visit with morel iekyl [facility] etiology. Recommended continuing NSAIDs and f/u with urology for recheck.  Final Clinical Impressions(s) / ED Diagnoses   Final diagnoses:  Left flank pain   New Prescriptions Discharge Medication List as of 05/25/2016  3:43 PM    START taking these medications   Details  ibuprofen (ADVIL,MOTRIN) 800 MG tablet Take 1 tablet (800 mg total) by mouth every 6 (six) hours as needed for mild pain or moderate pain., Starting Mon 05/25/2016, Print       I personally performed the services described in this documentation, which was scribed in my presence. The recorded information has been reviewed and is accurate.      Lyndal Pulley,  MD 05/26/16 7829    Lyndal Pulley, MD 05/26/16 [phone]

## 2016-05-25 NOTE — ED Triage Notes (Signed)
Left flank x 3 days.

## 2016-05-25 NOTE — ED Notes (Signed)
Pt states he has a 5 year history of kidney stones and for the last 3 days he has been having symptoms of a kidney stone attack. Pt unable to get an appointment with PCP.

## 2016-05-26 DIAGNOSIS — N200 Calculus of kidney: Secondary | ICD-10-CM | POA: Diagnosis not present

## 2016-06-04 DIAGNOSIS — G43009 Migraine without aura, not intractable, without status migrainosus: Secondary | ICD-10-CM | POA: Diagnosis not present

## 2016-06-04 DIAGNOSIS — G4440 Drug-induced headache, not elsewhere classified, not intractable: Secondary | ICD-10-CM | POA: Diagnosis not present

## 2016-06-10 DIAGNOSIS — N200 Calculus of kidney: Secondary | ICD-10-CM | POA: Diagnosis not present

## 2016-06-10 DIAGNOSIS — N201 Calculus of ureter: Secondary | ICD-10-CM | POA: Diagnosis not present

## 2016-06-10 DIAGNOSIS — R109 Unspecified abdominal pain: Secondary | ICD-10-CM | POA: Diagnosis not present

## 2016-06-17 ENCOUNTER — Encounter (HOSPITAL_BASED_OUTPATIENT_CLINIC_OR_DEPARTMENT_OTHER): Payer: Self-pay | Admitting: Emergency Medicine

## 2016-06-17 ENCOUNTER — Emergency Department (HOSPITAL_BASED_OUTPATIENT_CLINIC_OR_DEPARTMENT_OTHER): Payer: 59

## 2016-06-17 ENCOUNTER — Emergency Department (HOSPITAL_BASED_OUTPATIENT_CLINIC_OR_DEPARTMENT_OTHER)
Admission: EM | Admit: 2016-06-17 | Discharge: 2016-06-17 | Disposition: A | Discharge status: Home / Self Care | Payer: 59 | Attending: Emergency Medicine | Admitting: Emergency Medicine

## 2016-06-17 DIAGNOSIS — Z79899 Other long term (current) drug therapy: Secondary | ICD-10-CM | POA: Diagnosis not present

## 2016-06-17 DIAGNOSIS — R109 Unspecified abdominal pain: Secondary | ICD-10-CM | POA: Diagnosis not present

## 2016-06-17 DIAGNOSIS — F17210 Nicotine dependence, cigarettes, uncomplicated: Secondary | ICD-10-CM | POA: Diagnosis not present

## 2016-06-17 DIAGNOSIS — R1032 Left lower quadrant pain: Secondary | ICD-10-CM | POA: Diagnosis not present

## 2016-06-17 LAB — CBC WITH DIFFERENTIAL/PLATELET
Basophils Absolute: 0 10*3/uL (ref 0.0–0.1)
Basophils Relative: 1 %
EOS ABS: 0.1 10*3/uL (ref 0.0–0.7)
EOS PCT: 2 %
HCT: 42.5 % (ref 39.0–52.0)
Hemoglobin: 14.7 g/dL (ref 13.0–17.0)
LYMPHS ABS: 2 10*3/uL (ref 0.7–4.0)
Lymphocytes Relative: 31 %
MCH: 31.1 pg (ref 26.0–34.0)
MCHC: 34.6 g/dL (ref 30.0–36.0)
MCV: 89.9 fL (ref 78.0–100.0)
Monocytes Absolute: 0.6 10*3/uL (ref 0.1–1.0)
Monocytes Relative: 10 %
Neutro Abs: 3.7 10*3/uL (ref 1.7–7.7)
Neutrophils Relative %: 56 %
PLATELETS: 238 10*3/uL (ref 150–400)
RBC: 4.73 MIL/uL (ref 4.22–5.81)
RDW: 12.9 % (ref 11.5–15.5)
WBC: 6.5 10*3/uL (ref 4.0–10.5)

## 2016-06-17 LAB — BASIC METABOLIC PANEL
Anion gap: 11 (ref 5–15)
BUN: 19 mg/dL (ref 6–20)
CHLORIDE: 104 mmol/L (ref 101–111)
CO2: 22 mmol/L (ref 22–32)
Calcium: 9 mg/dL (ref 8.9–10.3)
Creatinine, Ser: 0.92 mg/dL (ref 0.61–1.24)
GFR calc Af Amer: >60 mL/min (ref >60)
GFR calc non Af Amer: >60 mL/min (ref >60)
GLUCOSE: 71 mg/dL (ref 65–99)
Potassium: 3.7 mmol/L (ref 3.5–5.1)
SODIUM: 137 mmol/L (ref 135–145)

## 2016-06-17 LAB — URINALYSIS, ROUTINE W REFLEX MICROSCOPIC
GLUCOSE, UA: NEGATIVE mg/dL
HGB URINE DIPSTICK: NEGATIVE
Ketones, ur: 40 mg/dL — AB
Leukocytes, UA: NEGATIVE
Nitrite: NEGATIVE
PROTEIN: NEGATIVE mg/dL
Specific Gravity, Urine: 1.03 (ref 1.005–1.030)
pH: 6 (ref 5.0–8.0)

## 2016-06-17 MED ORDER — KETOROLAC TROMETHAMINE 30 MG/ML IJ SOLN
30.0000 mg | Freq: Once | INTRAMUSCULAR | Status: AC
  Administered 2016-06-17: 30 mg via INTRAVENOUS
  Filled 2016-06-17: qty 1

## 2016-06-17 MED ORDER — MORPHINE SULFATE (PF) 4 MG/ML IV SOLN
4.0000 mg | Freq: Once | INTRAVENOUS | Status: AC
  Administered 2016-06-17: 4 mg via INTRAVENOUS
  Filled 2016-06-17: qty 1

## 2016-06-17 MED ORDER — ONDANSETRON HCL 4 MG/2ML IJ SOLN
4.0000 mg | Freq: Once | INTRAMUSCULAR | Status: AC
  Administered 2016-06-17: 4 mg via INTRAVENOUS
  Filled 2016-06-17: qty 2

## 2016-06-17 NOTE — ED Triage Notes (Signed)
Pt having left sided flank pain for over one week.  Pt having dysuria as well.  No fever.  Some nausea and vomiting.

## 2016-06-17 NOTE — ED Provider Notes (Addendum)
[facility] Provider Note   CSN: [id number] Arrival date & time: 06/17/16  1014     History   Chief Complaint No chief complaint on file.   HPI Gregory Flores is a 36 y.o. male.  Patient is a 36 year old male with history of prior renal calculi. He presents for evaluation of left flank pain radiating into his left groin. This is been ongoing for one week. He was seen at an urgent care facility and told that he probably has a kidney stone. He returns today complaining of severe pain and continued hematuria.   The history is provided by the patient.  Flank Pain  This is a recurrent problem. Episode onset: 1 week ago. The problem occurs constantly. The problem has been gradually worsening. Associated symptoms include abdominal pain. Nothing aggravates the symptoms. Nothing relieves the symptoms. He has tried nothing for the symptoms.    Past Medical History:  Diagnosis Date  . Arthritis   . Bulging lumbar disc   . Chronic lumbar pain   . Depression   . Drug-seeking behavior   . Kidney stone   . Kidney stones   . Night terrors, adult   . Renal disorder     Patient Active Problem List   Diagnosis Date Noted  . Other convulsions 01/04/2013  . NEPHROLITHIASIS, HX OF 04/15/2010  . ABDOMINAL PAIN, LEFT LOWER QUADRANT 04/01/2010    Past Surgical History:  Procedure Laterality Date  . BLADDER STONE REMOVAL    . LITHOTRIPSY         Home Medications    Prior to Admission medications   Medication Sig Start Date End Date Taking? Authorizing Provider  ClonazePAM (KLONOPIN PO) Take by mouth.    Historical Provider, MD  DULOXETINE HCL PO Take by mouth.    Historical Provider, MD  GABAPENTIN PO Take by mouth.    Historical Provider, MD  ibuprofen (ADVIL,MOTRIN) 800 MG tablet Take 1 tablet (800 mg total) by mouth every 6 (six) hours as needed for mild pain or moderate pain. 05/25/16   Lyndal Pulley, MD  meperidine (DEMEROL) 50 MG tablet Take 50 mg by mouth every 4  (four) hours as needed for severe pain.    Historical Provider, MD  ondansetron (ZOFRAN ODT) 4 MG disintegrating tablet 4mg  ODT q4 hours prn nausea/vomit 01/13/16   Rolan Bucco, MD  ondansetron (ZOFRAN) 4 MG tablet Take 1 tablet (4 mg total) by mouth every 6 (six) hours. 10/10/15   Jacalyn Lefevre, MD  Prazosin HCl (MINIPRESS PO) Take by mouth.    Historical Provider, MD  traMADol (ULTRAM) 50 MG tablet Take 1 tablet (50 mg total) by mouth every 6 (six) hours as needed. 01/13/16   Rolan Bucco, MD    Family History No family history on file.  Social History Social History  Substance Use Topics  . Smoking status: Current Every Day Smoker    Packs/day: 0.50    Years: 7.00    Types: Cigarettes  . Smokeless tobacco: Never Used  . Alcohol use No     Allergies   Patient has no known allergies.   Review of Systems Review of Systems  Gastrointestinal: Positive for abdominal pain.  Genitourinary: Positive for flank pain.  All other systems reviewed and are negative.    Physical Exam Updated Vital Signs BP 125/86 (BP Location: Left Arm)   Pulse 85   Temp 97.8 F (36.6 C) (Oral)   Resp 18   Ht 5\' 7"  (1.702 m)   Wt  140 lb (63.5 kg)   SpO2 100%   BMI 21.93 kg/m   Physical Exam  Constitutional: He is oriented to person, place, and time. He appears well-developed and well-nourished. No distress.  HENT:  Head: Normocephalic and atraumatic.  Mouth/Throat: Oropharynx is clear and moist.  Neck: Normal range of motion. Neck supple.  Cardiovascular: Normal rate and regular rhythm.  Exam reveals no friction rub.   No murmur heard. Pulmonary/Chest: Effort normal and breath sounds normal. No respiratory distress. He has no wheezes. He has no rales.  Abdominal: Soft. Bowel sounds are normal. He exhibits no distension. There is tenderness.  There is tenderness to palpation in the left flank and left lower quadrant.  Musculoskeletal: Normal range of motion. He exhibits no edema.    Neurological: He is alert and oriented to person, place, and time. Coordination normal.  Skin: Skin is warm and dry. He is not diaphoretic.  Nursing note and vitals reviewed.    ED Treatments / Results  Labs (all labs ordered are listed, but only abnormal results are displayed) Labs Reviewed  BASIC METABOLIC PANEL  CBC WITH DIFFERENTIAL/PLATELET  URINALYSIS, ROUTINE W REFLEX MICROSCOPIC    EKG  EKG Interpretation None       Radiology No results found.  Procedures Procedures (including critical care time)  Medications Ordered in ED Medications  ondansetron (ZOFRAN) injection 4 mg (not administered)  ketorolac (TORADOL) 30 MG/ML injection 30 mg (not administered)     Initial Impression / Assessment and Plan / ED Course  I have reviewed the triage vital signs and the nursing notes.  Pertinent labs & imaging results that were available during my care of the patient were reviewed by me and considered in my medical decision making (see chart for details).  This patient is a 36 year old male with history of chronic flank pain and reported recurrent kidney stones. He presents here today with complaints of severe left flank pain that has been ongoing for one week. He reports being seen at urgent care and having an x-ray which revealed a kidney stone. His pain is not improving and presents here with ongoing pain. He also reports that he is having blood in his urine when he urinates.  CT scan today reveals no evidence for obstructing calculus and urinalysis reveals no evidence for blood. He is on the exam stretcher writhing and behaving quite dramatically, in a way that is out of proportion with physical exam and workup results.  When I explain the results of the CT scan and urinalysis to the patient and the fact that no pathology had been identified, the male companion began to yell at me stating that I "had to do something", and that she "didn't like my attitude". I explained  to her that I have performed all the tests that are available today at this facility and have excluded emergent pathology. She continued to insist that I do more tests. There is no medical indication for transfer or admission. I informed the patient and his male companion of my plan to administer medication to treat his pain, but then he would have to follow-up with his primary Dr. to discuss referral to pain management.  Upon reviewing this patient's chart, this has been an ongoing issue for him for may years. He has been seen in the emergency department multiple times with complaints of flank pain and no cause has been identified. He is also seen his primary doctor, urgent cares, urologists, however no cause has been found. To  the medical records available to me, this patient has had 14 CT scans performed in the past three years. This has been an ongoing problem for him for many years and I suspect there may be a psychosomatic or possibly a drug-seeking component to his symptoms.  As this is a chronic condition, he needs to follow up with his primary doctor for prescriptions for pain medications.  Final Clinical Impressions(s) / ED Diagnoses   Final diagnoses:  None    New Prescriptions New Prescriptions   No medications on file     Geoffery Lyonsouglas Colbie Sliker, MD 06/17/16 1159    Geoffery Lyonsouglas Colonel Krauser, MD 06/17/16 1159    Geoffery Lyonsouglas Alysha Doolan, MD 06/17/16 1207

## 2016-06-17 NOTE — ED Notes (Signed)
Pt refused a wheelchair in triage

## 2016-06-17 NOTE — Discharge Instructions (Signed)
Ibuprofen 600 mg every six hours as needed for pain.  Follow-up with your primary Dr. to discuss referrals to pain management.

## 2016-06-17 NOTE — ED Notes (Signed)
Yelling was heard outside of the room. Upon opening the door, the patient's spouse was yelling at Dr. Judd Lienelo that he needs to run more test to find out what is wrong with her husband.  Mrs. Karleen HampshireSpencer continued to belittle and yell at Dr. Judd Lienelo, until I asked her to lower her voice.  Patient and spouse are upset that they go to the PCP and Urology Doctors who tell them to go to the ED when he has pain.  CD made of CT scan and when over results with them.  Patient apologized for his wife.

## 2016-06-29 DIAGNOSIS — N200 Calculus of kidney: Secondary | ICD-10-CM | POA: Diagnosis not present

## 2016-06-29 DIAGNOSIS — R51 Headache: Secondary | ICD-10-CM | POA: Diagnosis not present

## 2016-06-29 DIAGNOSIS — M940 Chondrocostal junction syndrome [Tietze]: Secondary | ICD-10-CM | POA: Diagnosis not present

## 2016-06-29 DIAGNOSIS — R109 Unspecified abdominal pain: Secondary | ICD-10-CM | POA: Diagnosis not present

## 2016-07-01 DIAGNOSIS — R1011 Right upper quadrant pain: Secondary | ICD-10-CM | POA: Diagnosis not present

## 2016-07-01 DIAGNOSIS — G8929 Other chronic pain: Secondary | ICD-10-CM | POA: Diagnosis not present

## 2016-07-01 DIAGNOSIS — R109 Unspecified abdominal pain: Secondary | ICD-10-CM | POA: Diagnosis not present

## 2016-07-01 DIAGNOSIS — R319 Hematuria, unspecified: Secondary | ICD-10-CM | POA: Diagnosis not present

## 2016-07-01 DIAGNOSIS — N200 Calculus of kidney: Secondary | ICD-10-CM | POA: Diagnosis not present

## 2016-07-03 DIAGNOSIS — N200 Calculus of kidney: Secondary | ICD-10-CM | POA: Diagnosis not present

## 2016-07-03 DIAGNOSIS — N23 Unspecified renal colic: Secondary | ICD-10-CM | POA: Diagnosis not present

## 2016-07-06 DIAGNOSIS — R3129 Other microscopic hematuria: Secondary | ICD-10-CM | POA: Diagnosis not present

## 2016-07-06 DIAGNOSIS — N200 Calculus of kidney: Secondary | ICD-10-CM | POA: Diagnosis not present

## 2016-07-07 DIAGNOSIS — N200 Calculus of kidney: Secondary | ICD-10-CM | POA: Diagnosis not present

## 2016-07-08 DIAGNOSIS — N200 Calculus of kidney: Secondary | ICD-10-CM | POA: Diagnosis not present

## 2016-07-08 DIAGNOSIS — M549 Dorsalgia, unspecified: Secondary | ICD-10-CM | POA: Diagnosis not present

## 2016-07-08 DIAGNOSIS — N135 Crossing vessel and stricture of ureter without hydronephrosis: Secondary | ICD-10-CM | POA: Diagnosis not present

## 2016-07-16 DIAGNOSIS — R509 Fever, unspecified: Secondary | ICD-10-CM | POA: Diagnosis not present

## 2016-07-17 DIAGNOSIS — N200 Calculus of kidney: Secondary | ICD-10-CM | POA: Diagnosis not present

## 2016-07-26 ENCOUNTER — Emergency Department (HOSPITAL_BASED_OUTPATIENT_CLINIC_OR_DEPARTMENT_OTHER)
Admission: EM | Admit: 2016-07-26 | Discharge: 2016-07-27 | Disposition: A | Discharge status: Home / Self Care | Payer: 59 | Attending: Emergency Medicine | Admitting: Emergency Medicine

## 2016-07-26 ENCOUNTER — Encounter (HOSPITAL_BASED_OUTPATIENT_CLINIC_OR_DEPARTMENT_OTHER): Payer: Self-pay | Admitting: *Deleted

## 2016-07-26 DIAGNOSIS — S0083XA Contusion of other part of head, initial encounter: Secondary | ICD-10-CM | POA: Insufficient documentation

## 2016-07-26 DIAGNOSIS — S0081XA Abrasion of other part of head, initial encounter: Secondary | ICD-10-CM | POA: Diagnosis not present

## 2016-07-26 DIAGNOSIS — Y939 Activity, unspecified: Secondary | ICD-10-CM | POA: Insufficient documentation

## 2016-07-26 DIAGNOSIS — Z79899 Other long term (current) drug therapy: Secondary | ICD-10-CM | POA: Diagnosis not present

## 2016-07-26 DIAGNOSIS — Y92009 Unspecified place in unspecified non-institutional (private) residence as the place of occurrence of the external cause: Secondary | ICD-10-CM | POA: Insufficient documentation

## 2016-07-26 DIAGNOSIS — S0990XA Unspecified injury of head, initial encounter: Secondary | ICD-10-CM | POA: Diagnosis not present

## 2016-07-26 DIAGNOSIS — R51 Headache: Secondary | ICD-10-CM | POA: Diagnosis not present

## 2016-07-26 DIAGNOSIS — Y999 Unspecified external cause status: Secondary | ICD-10-CM | POA: Diagnosis not present

## 2016-07-26 DIAGNOSIS — F17210 Nicotine dependence, cigarettes, uncomplicated: Secondary | ICD-10-CM | POA: Diagnosis not present

## 2016-07-26 DIAGNOSIS — Z043 Encounter for examination and observation following other accident: Secondary | ICD-10-CM | POA: Diagnosis not present

## 2016-07-26 DIAGNOSIS — W19XXXA Unspecified fall, initial encounter: Secondary | ICD-10-CM

## 2016-07-26 DIAGNOSIS — W01198A Fall on same level from slipping, tripping and stumbling with subsequent striking against other object, initial encounter: Secondary | ICD-10-CM | POA: Insufficient documentation

## 2016-07-26 NOTE — ED Triage Notes (Signed)
Pt reports that he has had multiple kidney stents placed last week. States that he had a sharp pain and tripped over the stairs and hit his face on the dresser.  Denies LOC.  Pt noted to have abrasions around left eye.

## 2016-07-26 NOTE — ED Notes (Signed)
Pt provided with a urinal.

## 2016-07-26 NOTE — ED Provider Notes (Signed)
[facility]   CSN: [id number] Arrival date & time: 07/26/16  2341  By signing my name below, I, Linna Darner, attest that this documentation has been prepared under the direction and in the presence of [employer], PA-C . Electronically Signed: Linna Darner, Scribe. 07/26/2016. 11:54 PM.  History   Chief Complaint Chief Complaint  Patient presents with  . Fall    The history is provided by the patient. No language interpreter was used.     HPI Comments: Gregory Flores is a 36 y.o. male who presents to the Emergency Department complaining of a mechanical fall that occurred shortly PTA. He states he tripped and struck the left side of his face on a dresser. No LOC. Pt reports a bruise and swelling to the left side of his face and pain secondary to the bruise. He has a h/o chronic kidney stones and states he was having pain in his back directly prior to falling; he notes he fell due to the severity of his back pain. He denies nausea, vomiting, or any other associated symptoms.  Past Medical History:  Diagnosis Date  . Arthritis   . Bulging lumbar disc   . Chronic lumbar pain   . Depression   . Drug-seeking behavior   . Kidney stone   . Kidney stones   . Night terrors, adult   . Renal disorder     Patient Active Problem List   Diagnosis Date Noted  . Other convulsions 01/04/2013  . NEPHROLITHIASIS, HX OF 04/15/2010  . ABDOMINAL PAIN, LEFT LOWER QUADRANT 04/01/2010    Past Surgical History:  Procedure Laterality Date  . BLADDER STONE REMOVAL    . LITHOTRIPSY         Home Medications    Prior to Admission medications   Medication Sig Start Date End Date Taking? Authorizing Provider  cetirizine (ZYRTEC) 10 MG tablet Take 10 mg by mouth daily.   Yes Historical Provider, MD  lamiVUDine (EPIVIR) 150 MG tablet Take 150 mg by mouth 2 (two) times daily.   Yes Historical Provider, MD  propranolol (INDERAL) 40 MG tablet Take 40 mg by mouth 3  (three) times daily.   Yes Historical Provider, MD  ClonazePAM (KLONOPIN PO) Take 1 mg by mouth 2 (two) times daily.     Historical Provider, MD  GABAPENTIN PO Take 400 mg by mouth 3 (three) times daily.     Historical Provider, MD  ibuprofen (ADVIL,MOTRIN) 800 MG tablet Take 1 tablet (800 mg total) by mouth every 6 (six) hours as needed for mild pain or moderate pain. 05/25/16   Lyndal Pulley, MD    Family History History reviewed. No pertinent family history.  Social History Social History  Substance Use Topics  . Smoking status: Current Every Day Smoker    Packs/day: 0.50    Years: 7.00    Types: Cigarettes  . Smokeless tobacco: Never Used  . Alcohol use No     Allergies   Patient has no known allergies.   Review of Systems Review of Systems  All other systems reviewed and are negative for acute change except as noted in the HPI.  Physical Exam Updated Vital Signs BP (!) 137/100 (BP Location: Right Arm)   Pulse 71   Temp 97.7 F (36.5 C) (Oral)   Resp (!) 24   Ht  (1.753 m)   Wt 150 lb (68 kg)   SpO2 100%   BMI 22.15 kg/m   Physical Exam  Constitutional: He is oriented to person, place, and time. He appears well-developed and well-nourished. No distress.  HENT:  Head: Normocephalic.  Swelling above left eyebrow. Abrasion around left eye superiorly and inferiorly.   Eyes: Conjunctivae and EOM are normal. Pupils are equal, round, and reactive to light.  Neck: Neck supple. No tracheal deviation present.  Cardiovascular: Normal rate.   Pulmonary/Chest: Effort normal. No respiratory distress.  Musculoskeletal: Normal range of motion.  Neurological: He is alert and oriented to person, place, and time.  Skin: Skin is warm and dry.  Psychiatric: He has a normal mood and affect. His behavior is normal.  Nursing note and vitals reviewed.  ED Treatments / Results  Labs (all labs ordered are listed, but only abnormal results are displayed) Labs Reviewed    URINALYSIS, ROUTINE W REFLEX MICROSCOPIC    EKG  EKG Interpretation None       Radiology No results found.  Procedures Procedures (including critical care time)  DIAGNOSTIC STUDIES: Oxygen Saturation is 100% on RA, normal by my interpretation.    COORDINATION OF CARE: 12:02 AM Discussed treatment plan with pt at bedside and pt agreed to plan.  Medications Ordered in ED Medications - No data to display   Initial Impression / Assessment and Plan / ED Course  I have reviewed the triage vital signs and the nursing notes.  Pertinent labs & imaging results that were available during my care of the patient were reviewed by me and considered in my medical decision making (see chart for details).     Patient will be referred back to his urologist for any concerns about his back pain from what he feels like is his ureteral stents that were removed last Tuesday.  The patient does not have any acute injuries noted on CT maxillofacial imaging.  Patient is advised return here as needed.  Patient agrees the plan.  All questions were answered  Final Clinical Impressions(s) / ED Diagnoses   Final diagnoses:  None    New Prescriptions New Prescriptions   No medications on file   I personally performed the services described in this documentation, which was scribed in my presence. The recorded information has been reviewed and is accurate.   Charlestine Night, PA-C 07/27/16 0116    Paula Libra, MD 07/27/16 0130

## 2016-07-27 ENCOUNTER — Emergency Department (HOSPITAL_BASED_OUTPATIENT_CLINIC_OR_DEPARTMENT_OTHER): Payer: 59

## 2016-07-27 DIAGNOSIS — W19XXXD Unspecified fall, subsequent encounter: Secondary | ICD-10-CM | POA: Diagnosis not present

## 2016-07-27 DIAGNOSIS — S0081XA Abrasion of other part of head, initial encounter: Secondary | ICD-10-CM | POA: Diagnosis not present

## 2016-07-27 DIAGNOSIS — S060X0D Concussion without loss of consciousness, subsequent encounter: Secondary | ICD-10-CM | POA: Diagnosis not present

## 2016-07-27 DIAGNOSIS — R51 Headache: Secondary | ICD-10-CM | POA: Diagnosis not present

## 2016-07-27 LAB — URINALYSIS, ROUTINE W REFLEX MICROSCOPIC
BILIRUBIN URINE: NEGATIVE
Glucose, UA: NEGATIVE mg/dL
Hgb urine dipstick: NEGATIVE
Ketones, ur: NEGATIVE mg/dL
NITRITE: NEGATIVE
PH: 6 (ref 5.0–8.0)
Protein, ur: NEGATIVE mg/dL
SPECIFIC GRAVITY, URINE: 1.031 — AB (ref 1.005–1.030)

## 2016-07-27 LAB — URINALYSIS, MICROSCOPIC (REFLEX)

## 2016-07-27 MED ORDER — IBUPROFEN 800 MG PO TABS: 800.0000 mg | ORAL_TABLET | Freq: Three times a day (TID) | ORAL | 0 refills | Status: DC | PRN

## 2016-07-27 MED ORDER — IBUPROFEN 400 MG PO TABS
600.0000 mg | ORAL_TABLET | Freq: Once | ORAL | Status: AC
  Administered 2016-07-27: 600 mg via ORAL
  Filled 2016-07-27: qty 1

## 2016-07-27 MED ORDER — ONDANSETRON 8 MG PO TBDP
8.0000 mg | ORAL_TABLET | Freq: Once | ORAL | Status: AC
  Administered 2016-07-27: 8 mg via ORAL
  Filled 2016-07-27: qty 1

## 2016-07-27 MED ORDER — HYDROCODONE-ACETAMINOPHEN 5-325 MG PO TABS: 1.0000 | ORAL_TABLET | Freq: Four times a day (QID) | ORAL | 0 refills | Status: DC | PRN

## 2016-07-27 MED ORDER — OXYCODONE-ACETAMINOPHEN 5-325 MG PO TABS
1.0000 | ORAL_TABLET | Freq: Once | ORAL | Status: AC
Start: 2016-07-27 — End: 2016-07-27
  Administered 2016-07-27: 1 via ORAL
  Filled 2016-07-27: qty 1

## 2016-07-27 MED ORDER — ACETAMINOPHEN 325 MG PO TABS
650.0000 mg | ORAL_TABLET | Freq: Once | ORAL | Status: AC
  Administered 2016-07-27: 650 mg via ORAL
  Filled 2016-07-27: qty 2

## 2016-07-27 NOTE — Discharge Instructions (Signed)
Follow-up your urologist for any worsening in your back discomfort.  Your urine here tonight did not show any significant abnormalities.  Your CT scan did not show any significant abnormalities at this point

## 2016-07-27 NOTE — ED Notes (Signed)
Pt not in room, pt in xray.  

## 2016-07-28 DIAGNOSIS — N200 Calculus of kidney: Secondary | ICD-10-CM | POA: Diagnosis not present

## 2016-08-11 ENCOUNTER — Encounter (HOSPITAL_BASED_OUTPATIENT_CLINIC_OR_DEPARTMENT_OTHER): Payer: Self-pay | Admitting: Emergency Medicine

## 2016-08-11 ENCOUNTER — Emergency Department (HOSPITAL_BASED_OUTPATIENT_CLINIC_OR_DEPARTMENT_OTHER)
Admission: EM | Admit: 2016-08-11 | Discharge: 2016-08-11 | Disposition: A | Discharge status: Home / Self Care | Payer: 59 | Attending: Emergency Medicine | Admitting: Emergency Medicine

## 2016-08-11 ENCOUNTER — Emergency Department (HOSPITAL_BASED_OUTPATIENT_CLINIC_OR_DEPARTMENT_OTHER): Payer: 59

## 2016-08-11 DIAGNOSIS — N289 Disorder of kidney and ureter, unspecified: Secondary | ICD-10-CM

## 2016-08-11 DIAGNOSIS — F17210 Nicotine dependence, cigarettes, uncomplicated: Secondary | ICD-10-CM | POA: Insufficient documentation

## 2016-08-11 DIAGNOSIS — R52 Pain, unspecified: Secondary | ICD-10-CM

## 2016-08-11 DIAGNOSIS — N2889 Other specified disorders of kidney and ureter: Secondary | ICD-10-CM | POA: Diagnosis not present

## 2016-08-11 DIAGNOSIS — Z765 Malingerer [conscious simulation]: Secondary | ICD-10-CM

## 2016-08-11 DIAGNOSIS — N50819 Testicular pain, unspecified: Secondary | ICD-10-CM | POA: Diagnosis not present

## 2016-08-11 DIAGNOSIS — Z7289 Other problems related to lifestyle: Secondary | ICD-10-CM | POA: Insufficient documentation

## 2016-08-11 DIAGNOSIS — R1030 Lower abdominal pain, unspecified: Secondary | ICD-10-CM | POA: Diagnosis not present

## 2016-08-11 DIAGNOSIS — Z79899 Other long term (current) drug therapy: Secondary | ICD-10-CM | POA: Diagnosis not present

## 2016-08-11 DIAGNOSIS — R109 Unspecified abdominal pain: Secondary | ICD-10-CM | POA: Diagnosis not present

## 2016-08-11 DIAGNOSIS — N200 Calculus of kidney: Secondary | ICD-10-CM | POA: Diagnosis not present

## 2016-08-11 LAB — URINALYSIS, MICROSCOPIC (REFLEX)
Bacteria, UA: NONE SEEN
SQUAMOUS EPITHELIAL / LPF: NONE SEEN

## 2016-08-11 LAB — BASIC METABOLIC PANEL
Anion gap: 8 (ref 5–15)
BUN: 18 mg/dL (ref 6–20)
CALCIUM: 9.3 mg/dL (ref 8.9–10.3)
CO2: 26 mmol/L (ref 22–32)
Chloride: 104 mmol/L (ref 101–111)
Creatinine, Ser: 0.99 mg/dL (ref 0.61–1.24)
GFR calc Af Amer: >60 mL/min (ref >60)
Glucose, Bld: 89 mg/dL (ref 65–99)
POTASSIUM: 3.8 mmol/L (ref 3.5–5.1)
Sodium: 138 mmol/L (ref 135–145)

## 2016-08-11 LAB — URINALYSIS, ROUTINE W REFLEX MICROSCOPIC
BILIRUBIN URINE: NEGATIVE
GLUCOSE, UA: NEGATIVE mg/dL
HGB URINE DIPSTICK: NEGATIVE
KETONES UR: NEGATIVE mg/dL
Nitrite: NEGATIVE
PH: 6 (ref 5.0–8.0)
PROTEIN: NEGATIVE mg/dL
Specific Gravity, Urine: 1.017 (ref 1.005–1.030)

## 2016-08-11 MED ORDER — ONDANSETRON 8 MG PO TBDP: ORAL_TABLET | ORAL | 0 refills | Status: DC

## 2016-08-11 MED ORDER — TAMSULOSIN HCL 0.4 MG PO CAPS: 0.4000 mg | ORAL_CAPSULE | Freq: Every day | ORAL | 0 refills | Status: DC

## 2016-08-11 MED ORDER — TAMSULOSIN HCL 0.4 MG PO CAPS
0.4000 mg | ORAL_CAPSULE | Freq: Every day | ORAL | Status: DC
  Administered 2016-08-11: 0.4 mg via ORAL
  Filled 2016-08-11: qty 1

## 2016-08-11 MED ORDER — DICLOFENAC SODIUM ER 100 MG PO TB24: 100.0000 mg | ORAL_TABLET | Freq: Every day | ORAL | 0 refills | Status: DC

## 2016-08-11 MED ORDER — KETOROLAC TROMETHAMINE 30 MG/ML IJ SOLN
30.0000 mg | Freq: Once | INTRAMUSCULAR | Status: AC
  Administered 2016-08-11: 30 mg via INTRAMUSCULAR
  Filled 2016-08-11: qty 1

## 2016-08-11 NOTE — ED Triage Notes (Signed)
Left flank pain radiating into LLQ and left testicle.  Pain x1 month, since surgery for kidney stones on the left.  This severe of pain x24 hrs.  Sts he has an appointment with urology this week but could not "hold out" any longer.

## 2016-08-11 NOTE — ED Provider Notes (Signed)
[facility] Provider Note   CSN: [id number] Arrival date & time: 08/11/16  0036   By signing my name below, I, Clovis Pu, attest that this documentation has been prepared under the direction and in the presence of Charleigh Correnti, MD  Electronically Signed: Clovis Pu, ED Scribe. 08/11/16. 12:59 AM.   History   Chief Complaint Chief Complaint  Patient presents with  . Flank Pain    HPI Comments:  Lequan Dobratz is a 36 y.o. male who presents to the Emergency Department complaining of acute onset, moderate groin pain beginning 24 hours ago. Pt also reports dysuria. He has had Aleve, ibuprofen and leftover Vicodin with no relief. Pt denies fevers, diarrhea, congestion or any other associated symptoms. No other complaints noted at this time.   The history is provided by the patient. No language interpreter was used.  Flank Pain  This is a recurrent problem. The current episode started yesterday. The problem occurs constantly. The problem has not changed since onset.Pertinent negatives include no chest pain, no headaches and no shortness of breath. Nothing aggravates the symptoms. Nothing relieves the symptoms. Treatments tried: vicodin and alleve. The treatment provided no relief.    Past Medical History:  Diagnosis Date  . Arthritis   . Bulging lumbar disc   . Chronic lumbar pain   . Depression   . Drug-seeking behavior   . Kidney stone   . Kidney stones   . Night terrors, adult   . Renal disorder     Patient Active Problem List   Diagnosis Date Noted  . Other convulsions 01/04/2013  . NEPHROLITHIASIS, HX OF 04/15/2010  . ABDOMINAL PAIN, LEFT LOWER QUADRANT 04/01/2010    Past Surgical History:  Procedure Laterality Date  . BLADDER STONE REMOVAL    . LITHOTRIPSY         Home Medications    Prior to Admission medications   Medication Sig Start Date End Date Taking? Authorizing Provider  cetirizine (ZYRTEC) 10 MG tablet Take 10 mg by mouth daily.     Historical Provider, MD  ClonazePAM (KLONOPIN PO) Take 1 mg by mouth 2 (two) times daily.     Historical Provider, MD  GABAPENTIN PO Take 400 mg by mouth 3 (three) times daily.     Historical Provider, MD  HYDROcodone-acetaminophen (NORCO/VICODIN) 5-325 MG tablet Take 1 tablet by mouth every 6 (six) hours as needed for moderate pain. 07/27/16   Charlestine Night, PA-C  ibuprofen (ADVIL,MOTRIN) 800 MG tablet Take 1 tablet (800 mg total) by mouth every 8 (eight) hours as needed. 07/27/16   Charlestine Night, PA-C  lamiVUDine (EPIVIR) 150 MG tablet Take 150 mg by mouth 2 (two) times daily.    Historical Provider, MD  propranolol (INDERAL) 40 MG tablet Take 40 mg by mouth 3 (three) times daily.    Historical Provider, MD    Family History No family history on file.  Social History Social History  Substance Use Topics  . Smoking status: Current Every Day Smoker    Packs/day: 0.50    Years: 7.00    Types: Cigarettes  . Smokeless tobacco: Never Used  . Alcohol use No     Allergies   Patient has no known allergies.   Review of Systems Review of Systems  Constitutional: Negative for fever.  HENT: Negative for congestion.   Respiratory: Negative for shortness of breath.   Cardiovascular: Negative for chest pain.  Gastrointestinal: Negative for diarrhea and vomiting.  Genitourinary: Positive for dysuria, flank pain  and testicular pain.  Neurological: Negative for headaches.  All other systems reviewed and are negative.  Physical Exam Updated Vital Signs BP (!) 119/95 (BP Location: Left Arm)   Pulse 95   Temp 97.8 F (36.6 C) (Oral)   Resp 16   Ht 5\' 9"  (1.753 m)   Wt 150 lb (68 kg)   SpO2 100%   BMI 22.15 kg/m   Physical Exam  Constitutional: He is oriented to person, place, and time. He appears well-developed and well-nourished. No distress.  HENT:  Head: Normocephalic and atraumatic.  Mouth/Throat: Oropharynx is clear and moist. No oropharyngeal exudate.  Moist mucous  membranes   Eyes: Conjunctivae are normal. Pupils are equal, round, and reactive to light.  Neck: Normal range of motion. Neck supple. No JVD present.  Trachea midline No bruit  Cardiovascular: Normal rate, regular rhythm and normal heart sounds.   Pulmonary/Chest: Effort normal and breath sounds normal. No stridor. No respiratory distress. He has no wheezes. He has no rales.  Abdominal: Soft. Bowel sounds are normal. He exhibits no distension. There is no tenderness. There is no rebound and no guarding.  Musculoskeletal: Normal range of motion.  Neurological: He is alert and oriented to person, place, and time. He has normal reflexes. He displays normal reflexes.  Skin: Skin is warm and dry. Capillary refill takes less than 2 seconds.  Psychiatric: He has a normal mood and affect. His behavior is normal.  Nursing note and vitals reviewed.  ED Treatments / Results   Vitals:   08/11/16 0045 08/11/16 0245  BP: (!) 119/95 113/82  Pulse: 95 74  Resp: 16 17  Temp: 97.8 F (36.6 C) 97.8 F (36.6 C)    DIAGNOSTIC STUDIES:  Oxygen Saturation is 100% on RA, normal by my interpretation.    COORDINATION OF CARE:  12:52 AM Discussed treatment plan with pt at bedside and pt agreed to plan.  Labs (all labs ordered are listed, but only abnormal results are displayed)  Results for orders placed or performed during the hospital encounter of 08/11/16  Urinalysis, Routine w reflex microscopic  Result Value Ref Range   Color, Urine YELLOW YELLOW   APPearance CLOUDY (A) CLEAR   Specific Gravity, Urine 1.017 1.005 - 1.030   pH 6.0 5.0 - 8.0   Glucose, UA NEGATIVE NEGATIVE mg/dL   Hgb urine dipstick NEGATIVE NEGATIVE   Bilirubin Urine NEGATIVE NEGATIVE   Ketones, ur NEGATIVE NEGATIVE mg/dL   Protein, ur NEGATIVE NEGATIVE mg/dL   Nitrite NEGATIVE NEGATIVE   Leukocytes, UA TRACE (A) NEGATIVE  Basic metabolic panel  Result Value Ref Range   Sodium 138 135 - 145 mmol/L   Potassium 3.8 3.5  - 5.1 mmol/L   Chloride 104 101 - 111 mmol/L   CO2 26 22 - 32 mmol/L   Glucose, Bld 89 65 - 99 mg/dL   BUN 18 6 - 20 mg/dL   Creatinine, Ser 1.61 0.61 - 1.24 mg/dL   Calcium 9.3 8.9 - 09.6 mg/dL   GFR calc non Af Amer >60 >60 mL/min   GFR calc Af Amer >60 >60 mL/min   Anion gap 8 5 - 15  Urinalysis, Microscopic (reflex)  Result Value Ref Range   RBC / HPF 0-5 0 - 5 RBC/hpf   WBC, UA 0-5 0 - 5 WBC/hpf   Bacteria, UA NONE SEEN NONE SEEN   Squamous Epithelial / LPF NONE SEEN NONE SEEN   Mucous PRESENT    Ct Renal Stone Study  Result Date: 08/11/2016 CLINICAL DATA:  Left flank pain and left lower quadrant testicle pain hematuria EXAM: CT ABDOMEN AND PELVIS WITHOUT CONTRAST TECHNIQUE: Multidetector CT imaging of the abdomen and pelvis was performed following the standard protocol without IV contrast. COMPARISON:  Ultrasound 07/01/2016, CT 06/29/2016 FINDINGS: Lower chest: No acute abnormality. Hepatobiliary: No focal liver abnormality is seen. No gallstones, gallbladder wall thickening, or biliary dilatation. Pancreas: Unremarkable. No pancreatic ductal dilatation or surrounding inflammatory changes. Spleen: Normal in size without focal abnormality. Adrenals/Urinary Tract: Adrenal glands are within normal limits. There are multiple nonobstructing stones in the right kidney, the largest is seen in the midpole and measures 4 mm on axial images. Multiple stones are also present within the left kidney, the largest is seen in the lower pole and may represent 2 adjacent stones, these measure 5 mm in aggregate. Interim finding of a small perinephric low-density collection adjacent to the posterior inferior aspect of the left kidney measuring 3.4 by 1.2 cm. No significant left hydronephrosis. No definite ureteral stones. The bladder is normal. Stomach/Bowel: Stomach is within normal limits. Appendix appears normal. No evidence of bowel wall thickening, distention, or inflammatory changes. Vascular/Lymphatic:  No significant vascular findings are present. No enlarged abdominal or pelvic lymph nodes. Reproductive: Prostate is unremarkable. Other: No abdominal wall hernia or abnormality. No abdominopelvic ascites. Musculoskeletal: No acute or significant osseous findings. IMPRESSION: 1. Multiple intrarenal calculi, no significant left hydronephrosis or evidence for ureteral stone. New low density perinephric fluid collection along the posterior and inferior aspect of left kidney may relate to a resolving postsurgical collection. 2. There are no acute abnormalities visualized. Electronically Signed   By: Jasmine Pang M.D.   On: 08/11/2016 02:29   Ct Maxillofacial Wo Cm  Result Date: 07/27/2016 CLINICAL DATA:  Facial injury with abrasions and pain to the left maxilla and forehead EXAM: CT MAXILLOFACIAL WITHOUT CONTRAST TECHNIQUE: Multidetector CT imaging of the maxillofacial structures was performed. Multiplanar CT image reconstructions were also generated. A small metallic BB was placed on the right temple in order to reliably differentiate right from left. COMPARISON:  06/29/2016 head CT FINDINGS: Osseous: Mandibular heads appear normally position. There is no evidence for mandibular fracture. The pterygoid plates and zygomatic arches appear intact. Nasal bones appear intact. Orbits: No orbital fracture is visualized. No intra or extraconal soft tissue abnormality. The globes are unremarkable Sinuses: No acute fluid level. No sinus wall fracture. Mild rightward bowing of the nasal septum. Soft tissues: Soft tissue swelling over the left forehead and periorbital region. Soft tissue swelling anterior to the left maxillary sinus. Limited intracranial: No significant or unexpected finding. IMPRESSION: No acute displaced facial bone fracture visualized. Left periorbital, forehead, and anterior maxillary soft tissue swelling. Electronically Signed   By: Jasmine Pang M.D.   On: 07/27/2016 00:57    Procedures Procedures  (including critical care time)  Medications Ordered in ED Medications - No data to display   Initial Impression / Assessment and Plan / ED Course  Review of the DEA database: 07/27/2016 07/27/2016 HYDROCODONE- ACETAMIN 5- 325 MG [id number] 8 3 0 0 [id number] Eber Hong D MD [place], [place] [id number] [facility] [place], [place] Retter, Benford 08/17/1980 [address] [place], [place] [address] 01 13.33 07/18/2016 05/18/2016 CLONAZEPAM 1 MG TABLET [id number] 60 [id number] SENA CAROL LITTLE [place],  [id number] [place] [facility] [place],  [place], Spyridon L [birth year] [address] [place], [place] [address] 04 UNK 07/09/2016 07/08/2016 HYDROCODONE- ACETAMIN 5- 325 MG [id number] 20  3 0 0 [id number] PUSCHINSKY Adelfa Koh MD H[facility] [place], [place] [id number] [facility] [place], [place] Costantino, Tyress 07-31-1980 [address] [place], [place] [address] 04 33.33 07/06/2016 07/06/2016 HYDROCODONE- ACETAMIN 5- 325 MG [id number] 20 4 0 0 [id number] PUSCHINSKY Adelfa Koh MD [place], [place] [id number] [place] [facility] [place], [place] Savage, Rhonda L 1981/03/05 [address] [place], [place] [address] 04 25 07/01/2016 07/01/2016 HYDROCODONE- ACETAMIN 5- 325 MG [id number] 15 3 0 0 [id number] CVELIC PATRICK M PA-C [place], [place] [id number] [facility] [place], [place] Ribeiro, Ervine Jul 25, 1980 [address] [place], [place] [address] 04 25 06/15/2016 05/18/2016 CLONAZEPAM 1 MG TABLET [id number] 60 [id number] SENA CAROL LITTLE [place], [place] [id number] [place] [facility] [place], [place] Streat, Daishon L 07-Apr-1981 [address] [place], [place] [address] 04 UNK 06/10/2016 06/10/2016 HYDROCODONE- ACETAMIN 5- 325 MG [id number] 15 5 0 0 [id number] PESCHONG Corey Skains [place], [place] [id number] [facility] [place], [place] Ludvigsen, Herald 10/17/1980 [address] [place], [place] [address] 04 15 05/18/2016 05/18/2016 CLONAZEPAM 1 MG  TABLET [id number] 60 30 0 2 [id number] SENA CAROL LITTLE [place], [place] [id number] [place] [facility] [place], [place] Hammersmith, Siddhartha L Jan 03, 1981 [address] [place], [place] [address] 04 UNK 04/11/2016 03/13/2016 CLONAZEPAM 1 MG TABLET [id number] 60 [id number] [place], [place] [id number] [place] [facility] [place], [place] Boruff, Rorey L 02-21-1981 [address] [place], [place] [address] 04 UNK 03/13/2016 03/13/2016 CLONAZEPAM 1 MG TABLET [id number] 60 30 0 1 [id number] [facility] [place], [place] [id number] [place] [facility] [place], [place] Cocco, Salaam L December 22, 1980 [address] [place], [place] [address] 04 UNK 03/10/2016 03/10/2016 MEPERIDINE 50 MG TABLET [id number] 30 15 0 0 [id number] ASRES ALEHEGN MD [place], [place] [id number] [facility] [place], [place] Kirshenbaum, Onyx 07-16-1980 [address] [place], [place] [address] 04 10 02/05/2016 01/08/2016 CLONAZEPAM 1 MG TABLET [id number] 60 [id number] [place], [place] [id number] [place] [facility] [place], [place] Eades, Corgan L [birth year] [address] [place], [place] [address] 04 UNK 01/14/2016 01/13/2016 TRAMADOL HCL 50 MG TABLET [id number] 15 3 0 0 [id number] OPITZ BRIAN A (MD) [place], [place] [id number] [facility] [place], [place] Raulerson, Eulogio November 08, 1980 [address] [place], [place] [address] 04 25 01/08/2016 01/08/2016 CLONAZEPAM 1 MG TABLET [id number] 60 30 0 1 [id number] [facility] [place], [place] [id number] [place] [facility] [place],  [place], Kamon L 05/04/1980 [address] [place], [place] [address] 04 UNK 10/10/2015 10/10/2015 HYDROCODONE- ACETAMIN 5- 325 MG [id number] 10 3 0 0 [id number] [place] JULIE CHRISTINE [place], [place] [id number] [facility] [facility] [place],  Grosso, Chadrick 1981-02-22 [address] [place], [place] [address] 04 16.67  08/27/2015 08/27/2015 HYDROCODONE- ACETAMIN 5- 325 MG [id number]   Final Clinical Impressions(s) / ED Diagnoses  I suspect this is drug seeking behavior.  There is no blood in the urine and the sediment in the urine jug is not a stone, it is flat and floats like a piece of tissue.  Had the patient passed a stone in the ED there would certainly have been blood in the urine as there would have been trauma to the penile urethra.  Moreover there is no hydroureter or hydronephrosis.  The patient will not be receiving narcotics for this reason.   Return immediately for uncontrolled bleeding or pain, intractable vomiting, fever > 101 or any concerns.  Patient and family verbalize  understanding and agree to follow up.  I have reviewed the triage vital signs and the nursing notes. Pertinent labs &imaging results that were available during my care of the patient were reviewed by me and considered in my medical decision making (see chart for details). The patient is nontoxic-appearing on exam and vital signs are within normal limits.    After history, exam, and medical workup I feel the patient has been appropriately medically screened and is safe for discharge home. Pertinent diagnoses were discussed with the patient. Patient was given return precautions.  I personally performed the services described in this documentation, which was scribed in my presence. The recorded information has been reviewed and is accurate.       Cy Blamer, MD 08/11/16 [id number]

## 2016-08-14 DIAGNOSIS — G43009 Migraine without aura, not intractable, without status migrainosus: Secondary | ICD-10-CM | POA: Diagnosis not present

## 2016-08-28 DIAGNOSIS — R109 Unspecified abdominal pain: Secondary | ICD-10-CM | POA: Diagnosis not present

## 2016-08-28 DIAGNOSIS — R110 Nausea: Secondary | ICD-10-CM | POA: Diagnosis not present

## 2016-08-28 DIAGNOSIS — R319 Hematuria, unspecified: Secondary | ICD-10-CM | POA: Diagnosis not present

## 2016-08-28 DIAGNOSIS — N200 Calculus of kidney: Secondary | ICD-10-CM | POA: Diagnosis not present

## 2016-08-31 DIAGNOSIS — G43009 Migraine without aura, not intractable, without status migrainosus: Secondary | ICD-10-CM | POA: Diagnosis not present

## 2016-09-11 DIAGNOSIS — R55 Syncope and collapse: Secondary | ICD-10-CM | POA: Diagnosis not present

## 2016-09-11 DIAGNOSIS — G43009 Migraine without aura, not intractable, without status migrainosus: Secondary | ICD-10-CM | POA: Diagnosis not present

## 2016-09-15 DIAGNOSIS — R55 Syncope and collapse: Secondary | ICD-10-CM | POA: Diagnosis not present

## 2016-09-15 DIAGNOSIS — I959 Hypotension, unspecified: Secondary | ICD-10-CM | POA: Diagnosis not present

## 2016-10-06 DIAGNOSIS — R55 Syncope and collapse: Secondary | ICD-10-CM | POA: Diagnosis not present

## 2016-10-07 DIAGNOSIS — N200 Calculus of kidney: Secondary | ICD-10-CM | POA: Diagnosis not present

## 2016-10-07 DIAGNOSIS — R319 Hematuria, unspecified: Secondary | ICD-10-CM | POA: Diagnosis not present

## 2016-10-07 DIAGNOSIS — M545 Low back pain: Secondary | ICD-10-CM | POA: Diagnosis not present

## 2016-10-07 DIAGNOSIS — Z87442 Personal history of urinary calculi: Secondary | ICD-10-CM | POA: Diagnosis not present

## 2016-10-09 DIAGNOSIS — N200 Calculus of kidney: Secondary | ICD-10-CM | POA: Diagnosis not present

## 2016-10-09 DIAGNOSIS — R110 Nausea: Secondary | ICD-10-CM | POA: Diagnosis not present

## 2016-11-11 ENCOUNTER — Emergency Department (HOSPITAL_BASED_OUTPATIENT_CLINIC_OR_DEPARTMENT_OTHER): Payer: 59

## 2016-11-11 ENCOUNTER — Encounter (HOSPITAL_BASED_OUTPATIENT_CLINIC_OR_DEPARTMENT_OTHER): Payer: Self-pay | Admitting: Emergency Medicine

## 2016-11-11 ENCOUNTER — Emergency Department (HOSPITAL_BASED_OUTPATIENT_CLINIC_OR_DEPARTMENT_OTHER)
Admission: EM | Admit: 2016-11-11 | Discharge: 2016-11-11 | Disposition: A | Discharge status: Home / Self Care | Payer: 59 | Attending: Emergency Medicine | Admitting: Emergency Medicine

## 2016-11-11 DIAGNOSIS — N200 Calculus of kidney: Secondary | ICD-10-CM | POA: Diagnosis not present

## 2016-11-11 DIAGNOSIS — R1011 Right upper quadrant pain: Secondary | ICD-10-CM | POA: Diagnosis present

## 2016-11-11 DIAGNOSIS — Z87442 Personal history of urinary calculi: Secondary | ICD-10-CM | POA: Insufficient documentation

## 2016-11-11 DIAGNOSIS — F17210 Nicotine dependence, cigarettes, uncomplicated: Secondary | ICD-10-CM | POA: Insufficient documentation

## 2016-11-11 DIAGNOSIS — Z79899 Other long term (current) drug therapy: Secondary | ICD-10-CM | POA: Insufficient documentation

## 2016-11-11 DIAGNOSIS — R109 Unspecified abdominal pain: Secondary | ICD-10-CM

## 2016-11-11 LAB — CBC WITH DIFFERENTIAL/PLATELET
Basophils Absolute: 0 10*3/uL (ref 0.0–0.1)
Basophils Relative: 0 %
EOS ABS: 0.3 10*3/uL (ref 0.0–0.7)
EOS PCT: 4 %
HCT: 41.8 % (ref 39.0–52.0)
Hemoglobin: 14.2 g/dL (ref 13.0–17.0)
LYMPHS ABS: 1.7 10*3/uL (ref 0.7–4.0)
LYMPHS PCT: 25 %
MCH: 31.9 pg (ref 26.0–34.0)
MCHC: 34 g/dL (ref 30.0–36.0)
MCV: 93.9 fL (ref 78.0–100.0)
MONO ABS: 0.6 10*3/uL (ref 0.1–1.0)
MONOS PCT: 9 %
Neutro Abs: 4.4 10*3/uL (ref 1.7–7.7)
Neutrophils Relative %: 62 %
PLATELETS: 207 10*3/uL (ref 150–400)
RBC: 4.45 MIL/uL (ref 4.22–5.81)
RDW: 13.1 % (ref 11.5–15.5)
WBC: 7.1 10*3/uL (ref 4.0–10.5)

## 2016-11-11 LAB — BASIC METABOLIC PANEL
Anion gap: 8 (ref 5–15)
BUN: 13 mg/dL (ref 6–20)
CO2: 32 mmol/L (ref 22–32)
Calcium: 9 mg/dL (ref 8.9–10.3)
Chloride: 101 mmol/L (ref 101–111)
Creatinine, Ser: 1.12 mg/dL (ref 0.61–1.24)
GFR calc Af Amer: >60 mL/min (ref >60)
GFR calc non Af Amer: >60 mL/min (ref >60)
Glucose, Bld: 92 mg/dL (ref 65–99)
Potassium: 3.7 mmol/L (ref 3.5–5.1)
Sodium: 141 mmol/L (ref 135–145)

## 2016-11-11 LAB — URINALYSIS, ROUTINE W REFLEX MICROSCOPIC
BILIRUBIN URINE: NEGATIVE
Glucose, UA: NEGATIVE mg/dL
Ketones, ur: NEGATIVE mg/dL
NITRITE: NEGATIVE
PH: 8 (ref 5.0–8.0)
Protein, ur: NEGATIVE mg/dL
SPECIFIC GRAVITY, URINE: 1.018 (ref 1.005–1.030)

## 2016-11-11 LAB — URINALYSIS, MICROSCOPIC (REFLEX)

## 2016-11-11 MED ORDER — FENTANYL CITRATE (PF) 100 MCG/2ML IJ SOLN
100.0000 ug | Freq: Once | INTRAMUSCULAR | Status: AC
  Administered 2016-11-11: 100 ug via INTRAVENOUS
  Filled 2016-11-11: qty 2

## 2016-11-11 MED ORDER — PROMETHAZINE HCL 25 MG PO TABS: 25.0000 mg | ORAL_TABLET | Freq: Four times a day (QID) | ORAL | 0 refills | Status: DC | PRN

## 2016-11-11 MED ORDER — LIDOCAINE HCL (CARDIAC) 20 MG/ML IV SOLN
100.0000 mg | Freq: Once | INTRAVENOUS | Status: AC
  Administered 2016-11-11: 100 mg via INTRAVENOUS
  Filled 2016-11-11: qty 5

## 2016-11-11 MED ORDER — KETOROLAC TROMETHAMINE 30 MG/ML IJ SOLN
30.0000 mg | Freq: Once | INTRAMUSCULAR | Status: AC
  Administered 2016-11-11: 30 mg via INTRAVENOUS
  Filled 2016-11-11: qty 1

## 2016-11-11 MED FILL — PROMETHAZINE 25 MG TABLET: 25 | 1 days supply | Qty: 6 | Fill #0

## 2016-11-11 NOTE — ED Provider Notes (Signed)
[facility] Provider Note   CSN: [id number] Arrival date & time: 11/11/16  1116     History   Chief Complaint Chief Complaint  Patient presents with  . Flank Pain    right    HPI Gregory Flores is a 36 y.o. male.  HPI Patient with history of kidney stones. Presents with hematuria dysuria and right flank pain since yesterday. States it feels like previous kidney stones. Began right CVA   area and has now moved down more. No fevers. No relief with Tylenol or hydrocodone that he had at home. History of chronic flank pain and has been diagnosed with drug-seeking behavior in the past. Has had recent stone removal a few months ago. Also has had CT scans with active pain with no obstruction. Past Medical History:  Diagnosis Date  . Arthritis   . Bulging lumbar disc   . Chronic lumbar pain   . Depression   . Drug-seeking behavior   . Kidney stone   . Kidney stones   . Night terrors, adult   . Renal disorder     Patient Active Problem List   Diagnosis Date Noted  . Other convulsions 01/04/2013  . NEPHROLITHIASIS, HX OF 04/15/2010  . ABDOMINAL PAIN, LEFT LOWER QUADRANT 04/01/2010    Past Surgical History:  Procedure Laterality Date  . BLADDER STONE REMOVAL    . LITHOTRIPSY         Home Medications    Prior to Admission medications   Medication Sig Start Date End Date Taking? Authorizing Provider  cetirizine (ZYRTEC) 10 MG tablet Take 10 mg by mouth daily.    [provider]  ClonazePAM (KLONOPIN PO) Take 1 mg by mouth 2 (two) times daily.     [provider]  Diclofenac Sodium CR (VOLTAREN-XR) 100 MG 24 hr tablet Take 1 tablet (100 mg total) by mouth daily. 08/11/16   Palumbo, April, MD  GABAPENTIN PO Take 400 mg by mouth 3 (three) times daily.     [provider]  HYDROcodone-acetaminophen (NORCO/VICODIN) 5-325 MG tablet Take 1 tablet by mouth every 6 (six) hours as needed for moderate pain. 07/27/16   Lawyer, Cristal Deerhristopher, PA-C    ibuprofen (ADVIL,MOTRIN) 800 MG tablet Take 1 tablet (800 mg total) by mouth every 8 (eight) hours as needed. 07/27/16   Lawyer, Cristal Deerhristopher, PA-C  lamiVUDine (EPIVIR) 150 MG tablet Take 150 mg by mouth 2 (two) times daily.    [provider]  ondansetron (ZOFRAN ODT) 8 MG disintegrating tablet 8mg  ODT q8 hours prn nausea 08/11/16   Palumbo, April, MD  promethazine (PHENERGAN) 25 MG tablet Take 1 tablet (25 mg total) by mouth every 6 (six) hours as needed for nausea. 11/11/16   Benjiman CorePickering, Caya Soberanis, MD  propranolol (INDERAL) 40 MG tablet Take 40 mg by mouth 3 (three) times daily.    [provider]  tamsulosin (FLOMAX) 0.4 MG CAPS capsule Take 1 capsule (0.4 mg total) by mouth daily. 08/11/16   Palumbo, April, MD    Family History No family history on file.  Social History Social History  Substance Use Topics  . Smoking status: Current Every Day Smoker    Packs/day: 0.50    Years: 7.00    Types: Cigarettes  . Smokeless tobacco: Never Used  . Alcohol use No     Allergies   Patient has no known allergies.   Review of Systems Review of Systems  Constitutional: Negative for appetite change and chills.  HENT: Negative for congestion.  Respiratory: Negative for cough.   Cardiovascular: Negative for chest pain.  Gastrointestinal: Positive for nausea.  Genitourinary: Positive for flank pain and hematuria.  Musculoskeletal: Negative for back pain.  Neurological: Negative for headaches.  Hematological: Negative for adenopathy.  Psychiatric/Behavioral: Negative for agitation.     Physical Exam Updated Vital Signs BP (!) 142/99 (BP Location: Right Arm)   Pulse 86   Temp 98.2 F (36.8 C) (Oral)   Resp 18   Ht 5\' 8"  (1.727 m)   Wt 68 kg (150 lb)   SpO2 97%   BMI 22.81 kg/m   Physical Exam  Constitutional: He appears well-developed.  Patient is keeping all his muscles tense.  Eyes: EOM are normal.  Cardiovascular: Normal rate.   Pulmonary/Chest: Effort normal.   Abdominal: He exhibits no distension and no mass.  No clear abdominal tenderness once patient is able to relax his muscles  Genitourinary:  Genitourinary Comments: CVA tenderness on right side.  Musculoskeletal: He exhibits no edema.  Neurological: He is alert.  Skin: Skin is warm.     ED Treatments / Results  Labs (all labs ordered are listed, but only abnormal results are displayed) Labs Reviewed  URINALYSIS, ROUTINE W REFLEX MICROSCOPIC - Abnormal; Notable for the following:       Result Value   APPearance TURBID (*)    Hgb urine dipstick SMALL (*)    Leukocytes, UA SMALL (*)    All other components within normal limits  URINALYSIS, MICROSCOPIC (REFLEX) - Abnormal; Notable for the following:    Bacteria, UA FEW (*)    Squamous Epithelial / LPF 0-5 (*)    All other components within normal limits  URINE CULTURE  BASIC METABOLIC PANEL  CBC WITH DIFFERENTIAL/PLATELET    EKG  EKG Interpretation None       Radiology Dg Abdomen 1 View  Result Date: 11/11/2016 CLINICAL DATA:  RIGHT flank pain, history kidney stones. EXAM: ABDOMEN - 1 VIEW COMPARISON:  CT urogram 10/09/2016. FINDINGS: Normal bowel gas pattern. Small calcifications overlie the RIGHT renal outline consistent with calculi. The LEFT renal outline is obscured by stool. No definite ureteral calculi. IMPRESSION: Suspected RIGHT nephrolithiasis. If further investigation desired, recommend CT urography. Electronically Signed   By: Elsie StainJohn T Curnes M.D.   On: 11/11/2016 11:56   [place] Renal  Result Date: 11/11/2016 CLINICAL DATA:  RIGHT flank pain for 1-2 days. EXAM: RENAL / URINARY TRACT ULTRASOUND COMPLETE COMPARISON:  10/09/2016 FINDINGS: Right Kidney: Length: 10.1 cm. Echogenicity within normal limits. No mass or hydronephrosis visualized. Several 2-3 mm calculi. No obstruction. Left Kidney: Length: 10.8 cm. Echogenicity within normal limits. No mass or hydronephrosis visualized. Small nonobstructing calculus measuring 3 mm  Bladder: Appears normal for degree of bladder distention. IMPRESSION: 1. Bilateral nephrolithiasis. 2. No renal obstruction. Electronically Signed   By: Genevive BiStewart  Edmunds M.D.   On: 11/11/2016 12:32    Procedures Procedures (including critical care time)  Medications Ordered in ED Medications  fentaNYL (SUBLIMAZE) injection 100 mcg (100 mcg Intravenous Given 11/11/16 1144)  ketorolac (TORADOL) 30 MG/ML injection 30 mg (30 mg Intravenous Given 11/11/16 1144)  lidocaine (cardiac) 100 mg/765ml (XYLOCAINE) 20 MG/ML injection 2% 100 mg (100 mg Intravenous Given 11/11/16 1327)     Initial Impression / Assessment and Plan / ED Course  I have reviewed the triage vital signs and the nursing notes.  Pertinent labs & imaging results that were available during my care of the patient were reviewed by me and considered in my medical decision  making (see chart for details).     Patient with flank pain. History of same. Has had kidney stones but also had suspected drug seeking in the past. I reviewed CAT scan from one month ago. Had renal stones then but no ureteral stones. Now has increased pain. Has some hematuria. Not clear infection. States the pain is same. Has no hydronephrosis on ultrasound. Initially had been given dose of fentanyl and Toradol. States it helped a little bit of pain return. Then given IV lidocaine with improvement of pain. Will follow with urology.  Final Clinical Impressions(s) / ED Diagnoses   Final diagnoses:  Flank pain    New Prescriptions New Prescriptions   PROMETHAZINE (PHENERGAN) 25 MG TABLET    Take 1 tablet (25 mg total) by mouth every 6 (six) hours as needed for nausea.     Benjiman Core, MD 11/11/16 1425

## 2016-11-11 NOTE — ED Triage Notes (Signed)
Pt reports right flank pain, sts Hx  kidney stones , sts dysuria and hematuria since last night. Pt appears in distress. sts took hydrocodone at home prior to arrival. Hx admission for kidney stone.

## 2016-11-11 NOTE — Discharge Instructions (Signed)
Follow with your urologist

## 2016-11-11 NOTE — ED Notes (Signed)
ED Provider at bedside. 

## 2016-11-12 LAB — URINE CULTURE

## 2016-11-16 DIAGNOSIS — G43009 Migraine without aura, not intractable, without status migrainosus: Secondary | ICD-10-CM | POA: Diagnosis not present

## 2016-11-17 IMAGING — CT CT RENAL STONE PROTOCOL
2 of 4 series · 16 of 46 positions shown, 18 images · non-contrast
Comparison: CT of the abdomen and pelvis March 01, 2014

CLINICAL DATA: LEFT greater than RIGHT flank pain worsening over 2
days.

EXAM:
CT ABDOMEN AND PELVIS WITHOUT CONTRAST
TECHNIQUE: Multidetector CT imaging of the abdomen and pelvis was performed
following the standard protocol without IV contrast.

[Series 2: renal stone < 200 lbs 5.0 b31f · axial · 0.70mm/px · z∈[+791,+1186]mm · 13 of 87 slices shown, 15 images]
[im 4/87  soft-tissue]
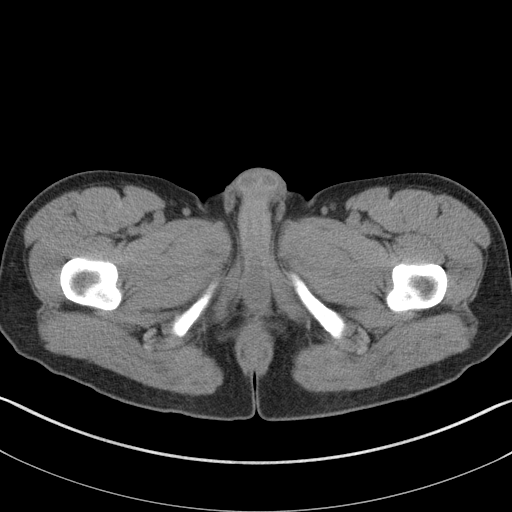
[im 4/87  bone]
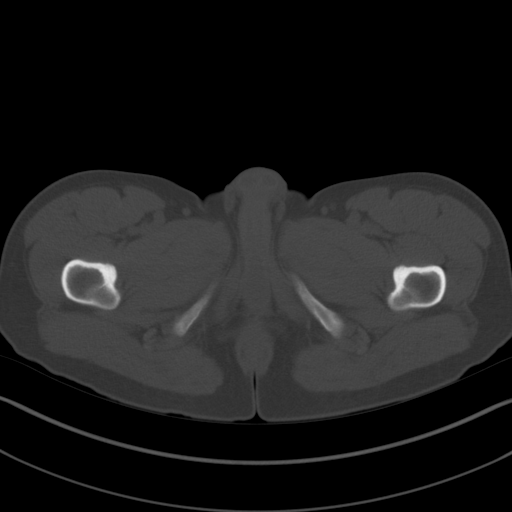
[im 12/87  soft-tissue]
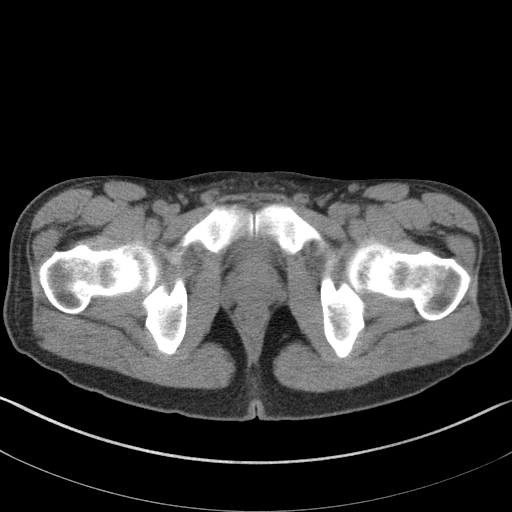
[im 20/87  soft-tissue]
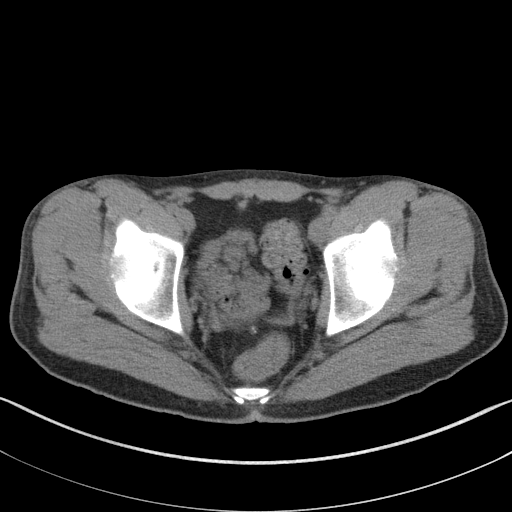
[im 24/87  soft-tissue]
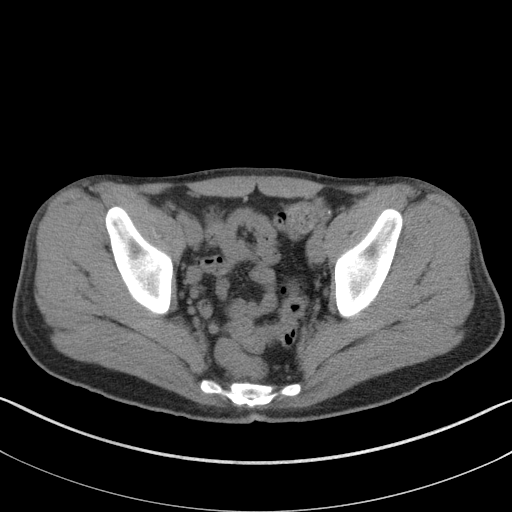
[im 32/87  soft-tissue]
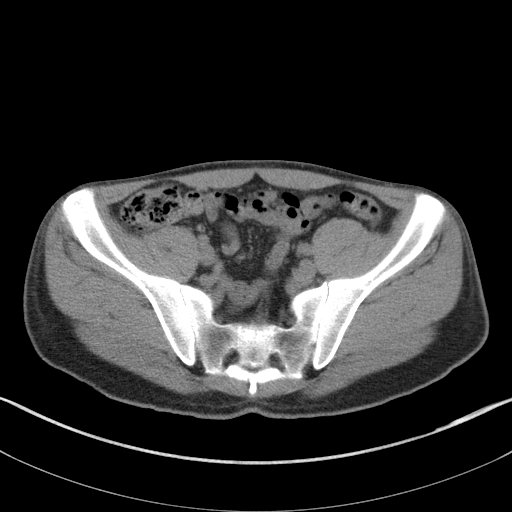
[im 36/87  soft-tissue]
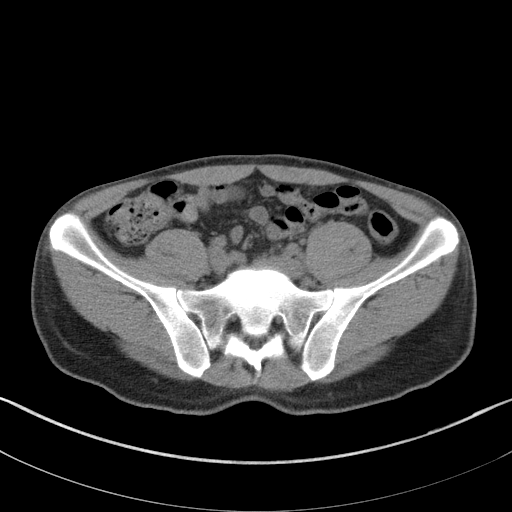
[im 44/87  soft-tissue]
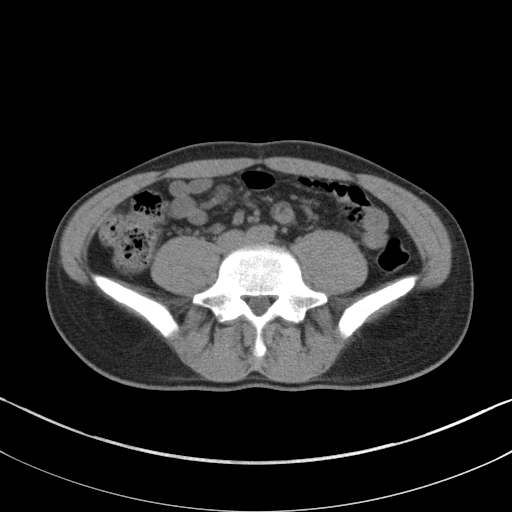
[im 51/87  soft-tissue]
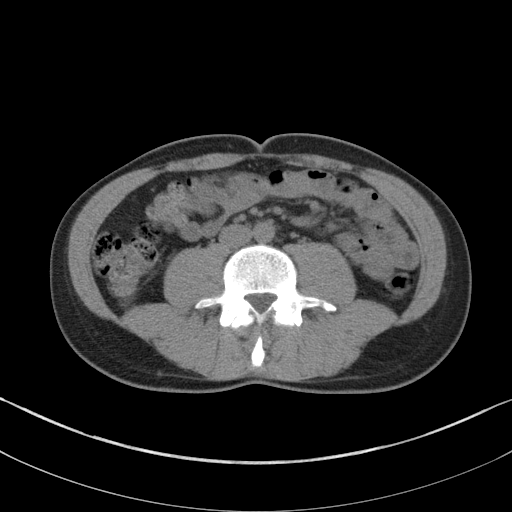
[im 55/87  soft-tissue]
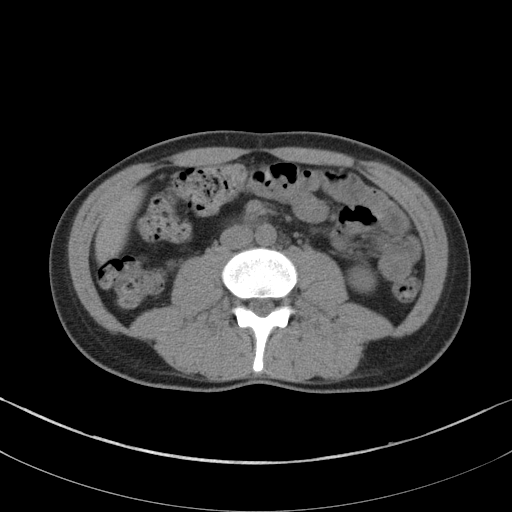
[im 55/87  bone]
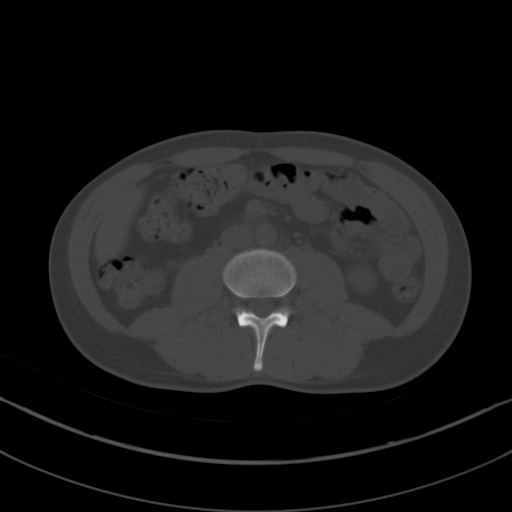
[im 63/87  soft-tissue]
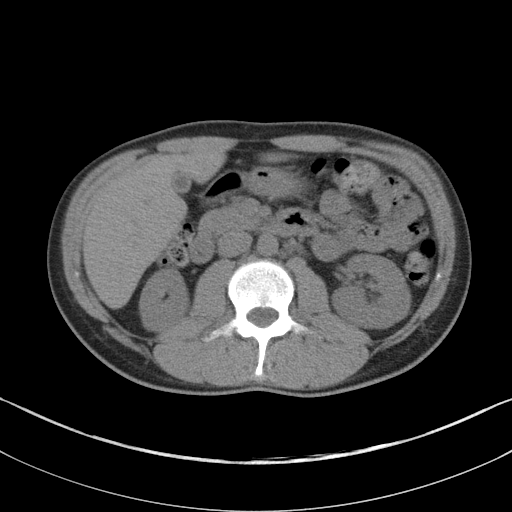
[im 67/87  soft-tissue]
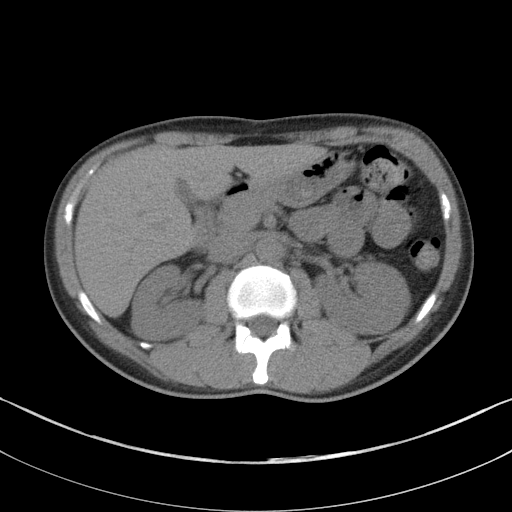
[im 75/87  soft-tissue]
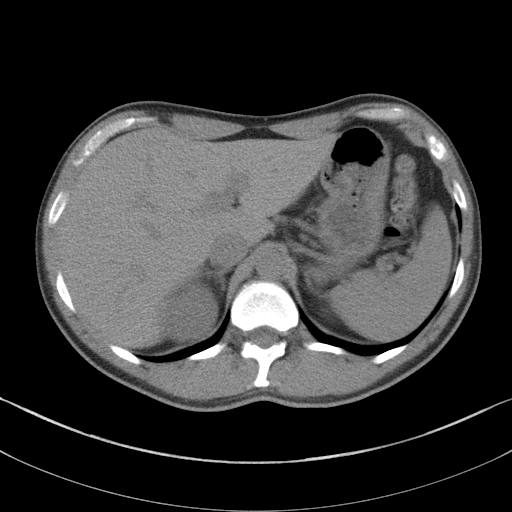
[im 83/87  soft-tissue]
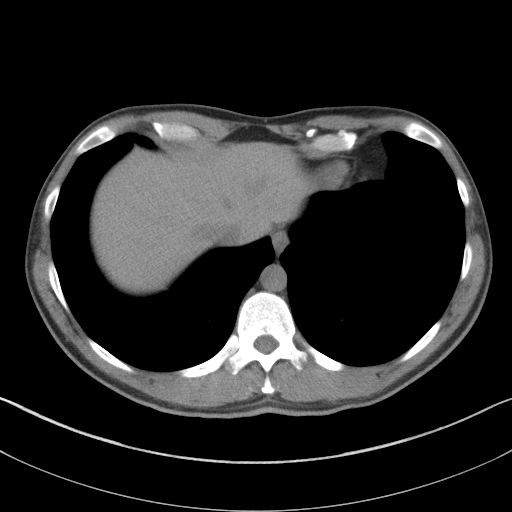

[Series 5: renal stone 3.0 coronal · coronal · 0.71mm/px · 3 of 69 slices shown]
[im 23/69  soft-tissue]
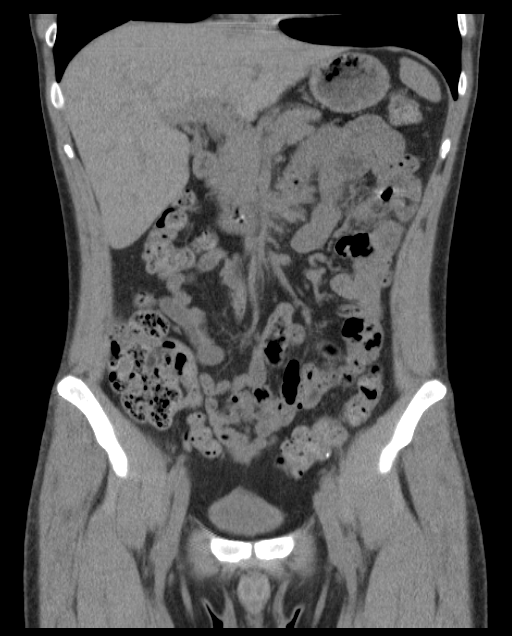
[im 31/69  soft-tissue]
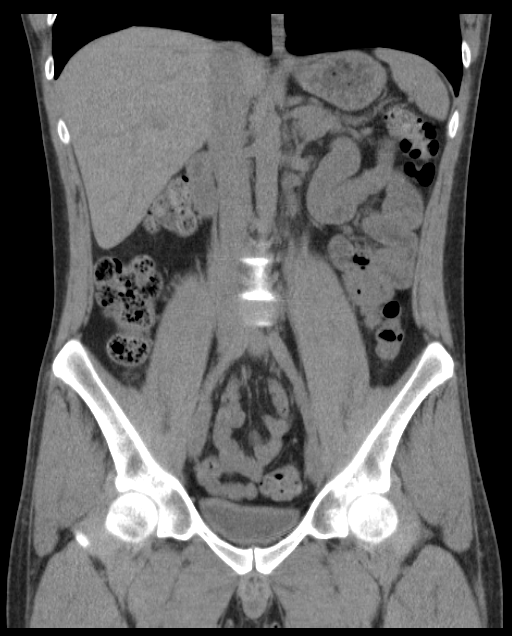
[im 38/69  soft-tissue]
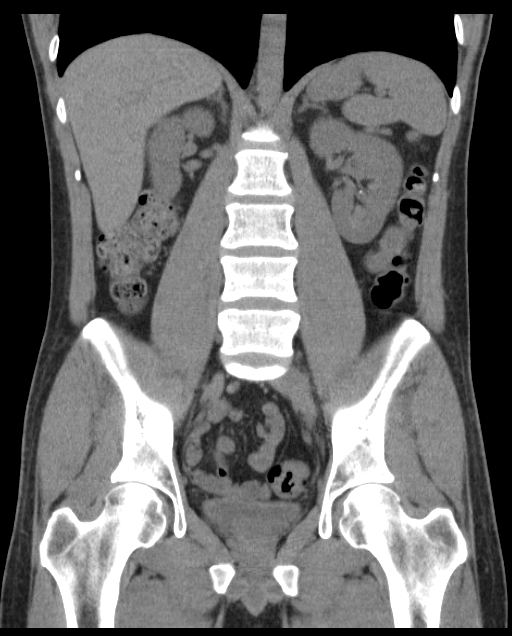

[16 of 46 positions shown; findings below may reference images not displayed]

FINDINGS: LUNG BASES: Included view of the lung bases are clear. The
visualized heart and pericardium are unremarkable.

KIDNEYS/BLADDER: Kidneys are orthotopic, demonstrating normal size
and morphology. No hydroureteronephrosis. Limited assessment for
renal masses on this nonenhanced examination. Punctate LEFT lower
pole, punctate LEFT interpolar nephrolithiasis. Punctate RIGHT
interpolar, 4 mm RIGHT lower pole nephrolithiasis. The urinary
bladder is decompressed, harboring no intravesicular calculi.

SOLID ORGANS: The liver, spleen, gallbladder, pancreas and adrenal
glands are unremarkable for this non-contrast examination.

GASTROINTESTINAL TRACT: The stomach, small and large bowel are
normal in course and caliber without inflammatory changes, the
sensitivity may be decreased by lack of enteric contrast. Normal
appendix.

PERITONEUM/RETROPERITONEUM: No intraperitoneal free fluid nor free
air. Aortoiliac vessels are normal in course and caliber. No
lymphadenopathy by CT size criteria. Prostate is not enlarged.
Subcentimeter calcified granuloma in the pelvis.

SOFT TISSUES/ OSSEOUS STRUCTURES:  Nonsuspicious.
IMPRESSION: Bilateral nonobstructing nephrolithiasis measuring up to 4 mm RIGHT
lower pole.

  By: [place]

## 2016-12-01 ENCOUNTER — Emergency Department (HOSPITAL_BASED_OUTPATIENT_CLINIC_OR_DEPARTMENT_OTHER): Payer: 59

## 2016-12-01 ENCOUNTER — Emergency Department (HOSPITAL_BASED_OUTPATIENT_CLINIC_OR_DEPARTMENT_OTHER)
Admission: EM | Admit: 2016-12-01 | Discharge: 2016-12-01 | Disposition: A | Discharge status: Home / Self Care | Payer: 59 | Attending: Emergency Medicine | Admitting: Emergency Medicine

## 2016-12-01 ENCOUNTER — Encounter (HOSPITAL_BASED_OUTPATIENT_CLINIC_OR_DEPARTMENT_OTHER): Payer: Self-pay

## 2016-12-01 DIAGNOSIS — R519 Headache, unspecified: Secondary | ICD-10-CM

## 2016-12-01 DIAGNOSIS — W19XXXA Unspecified fall, initial encounter: Secondary | ICD-10-CM

## 2016-12-01 DIAGNOSIS — R51 Headache: Secondary | ICD-10-CM | POA: Insufficient documentation

## 2016-12-01 DIAGNOSIS — R109 Unspecified abdominal pain: Secondary | ICD-10-CM | POA: Insufficient documentation

## 2016-12-01 DIAGNOSIS — Z79899 Other long term (current) drug therapy: Secondary | ICD-10-CM | POA: Diagnosis not present

## 2016-12-01 DIAGNOSIS — F17210 Nicotine dependence, cigarettes, uncomplicated: Secondary | ICD-10-CM | POA: Insufficient documentation

## 2016-12-01 MED ORDER — SODIUM CHLORIDE 0.9 % IV BOLUS (SEPSIS)
1000.0000 mL | Freq: Once | INTRAVENOUS | Status: AC
  Administered 2016-12-01: 1000 mL via INTRAVENOUS

## 2016-12-01 MED ORDER — DIPHENHYDRAMINE HCL 50 MG/ML IJ SOLN
50.0000 mg | Freq: Once | INTRAMUSCULAR | Status: AC
  Administered 2016-12-01: 50 mg via INTRAVENOUS
  Filled 2016-12-01: qty 1

## 2016-12-01 MED ORDER — KETOROLAC TROMETHAMINE 30 MG/ML IJ SOLN
30.0000 mg | Freq: Once | INTRAMUSCULAR | Status: AC
  Administered 2016-12-01: 30 mg via INTRAVENOUS
  Filled 2016-12-01: qty 1

## 2016-12-01 MED ORDER — METHOCARBAMOL 500 MG PO TABS: 500.0000 mg | ORAL_TABLET | Freq: Two times a day (BID) | ORAL | 0 refills | Status: DC

## 2016-12-01 MED ORDER — PROCHLORPERAZINE EDISYLATE 5 MG/ML IJ SOLN
10.0000 mg | Freq: Once | INTRAMUSCULAR | Status: AC
  Administered 2016-12-01: 10 mg via INTRAVENOUS
  Filled 2016-12-01: qty 2

## 2016-12-01 NOTE — Discharge Instructions (Signed)
Alternate 600 mg of ibuprofen and [phone] mg of Tylenol every 3 hours as needed for pain. Do not exceed 4000 mg of Tylenol daily. Apply ice or heat to the affected area for comfort. Use the incentive spirometer 3 times daily at least to avoid development of a pneumonia.Follow up with a primary care physician or come to the ED if you develop any concerning symptoms such as fever, cough, or shortness of breath.  Continue to use your migraine medications and follow-up with your neurologist in 2 weeks as  scheduled.

## 2016-12-01 NOTE — ED Notes (Signed)
Report given to Adam RN , pt moved to rm 12

## 2016-12-01 NOTE — ED Notes (Signed)
ED Provider at bedside. 

## 2016-12-01 NOTE — ED Triage Notes (Signed)
Pt states he fell into metal piece of gate yesterday-pain to left flank with small bruise noted-NAD-slow gait

## 2016-12-01 NOTE — ED Provider Notes (Signed)
Patient signed out to me by Michela Pitcher, PA-C, at change of shift.   Patient presented with left flank pain after running into metal piece of a gait yesterday. Pain is reproducible to palpation. Patient resistant to take deep inspirations due to pain so he was given incentive spirometry.   Patient also presented with HA that had been ongoing x 3days. History of migraines and has f/u with neurologist in 2 weeks for Botox injections. This is same as past HA's. Neurologic exam unremarkable. He was awaiting migraine cocktail at change of shift.   Upon re-examination of the patient, his symptoms have improved and he is requesting to go home.  Repeat neurologic exam without focal findings Vital signs are reassuring. The evaluation does not show pathology that would require ongoing emergent intervention or inpatient treatment. I advised the patient to follow-up with PCP this week and to keep his appointment with his neurologist. I advised the patient to return to the emergency department with new or worsening symptoms or new concerns. Specific return precautions discussed. The patient verbalized understanding and agreement with plan. All questions answered. No further questions at this time. The patient is hemodynamically stable, mentating appropriately and appears safe for discharge.      Princella Pellegrini 12/02/16 0238    Nira Conn, MD 12/02/16 1556

## 2016-12-01 NOTE — ED Provider Notes (Signed)
[facility]   CSN: [id number] Arrival date & time: 12/01/16  1355     History   Chief Complaint Chief Complaint  Patient presents with  . Fall    HPI Gregory Flores is a 36 y.o. male with history of chronic lumbar pain, drug-seeking behavior, nephrolithiasis who presents a with chief complaint acute onset, constant left flank pain secondary to injury yesterday. He states that he was taking out the trash when his left side ran into a metal piece of a gait. Denies head injury or LOC. Pain is constant and sharp, worse with deep inspiration and certain movements. He has tried ibuprofen, Tylenol, ice, heat without significant relief.  He also endorses 3 days of a right sided frontal migraine headache. States it is similar in quality to his usual migraines. Endorses photophobia, phonophobia, and nausea. Denies vomiting, fevers, altered mental status, numbness, tingling, weakness, or vision changes. He has tried sumatriptan and and his preventative medication without significant relief. He is scheduled to see a specialist for Botox in two weeks.  The history is provided by the patient.    Past Medical History:  Diagnosis Date  . Arthritis   . Bulging lumbar disc   . Chronic lumbar pain   . Depression   . Drug-seeking behavior   . Kidney stone   . Kidney stones   . Night terrors, adult   . Renal disorder     Patient Active Problem List   Diagnosis Date Noted  . Other convulsions 01/04/2013  . NEPHROLITHIASIS, HX OF 04/15/2010  . ABDOMINAL PAIN, LEFT LOWER QUADRANT 04/01/2010    Past Surgical History:  Procedure Laterality Date  . BLADDER STONE REMOVAL    . LITHOTRIPSY         Home Medications    Prior to Admission medications   Medication Sig Start Date End Date Taking? Authorizing Provider  cetirizine (ZYRTEC) 10 MG tablet Take 10 mg by mouth daily.    [provider]  ClonazePAM (KLONOPIN PO) Take 1 mg by mouth 2 (two) times daily.      [provider]  Diclofenac Sodium CR (VOLTAREN-XR) 100 MG 24 hr tablet Take 1 tablet (100 mg total) by mouth daily. 08/11/16   Palumbo, April, MD  GABAPENTIN PO Take 400 mg by mouth 3 (three) times daily.     [provider]  HYDROcodone-acetaminophen (NORCO/VICODIN) 5-325 MG tablet Take 1 tablet by mouth every 6 (six) hours as needed for moderate pain. 07/27/16   Lawyer, Cristal Deer, PA-C  ibuprofen (ADVIL,MOTRIN) 800 MG tablet Take 1 tablet (800 mg total) by mouth every 8 (eight) hours as needed. 07/27/16   Lawyer, Cristal Deer, PA-C  lamiVUDine (EPIVIR) 150 MG tablet Take 150 mg by mouth 2 (two) times daily.    [provider]  ondansetron (ZOFRAN ODT) 8 MG disintegrating tablet 8mg  ODT q8 hours prn nausea 08/11/16   Palumbo, April, MD  promethazine (PHENERGAN) 25 MG tablet Take 1 tablet (25 mg total) by mouth every 6 (six) hours as needed for nausea. 11/11/16   Benjiman Core, MD  propranolol (INDERAL) 40 MG tablet Take 40 mg by mouth 3 (three) times daily.    [provider]  tamsulosin (FLOMAX) 0.4 MG CAPS capsule Take 1 capsule (0.4 mg total) by mouth daily. 08/11/16   Palumbo, April, MD    Family History No family history on file.  Social History Social History  Substance Use Topics  . Smoking status: Current Every Day Smoker  Packs/day: 0.50    Years: 7.00    Types: Cigarettes  . Smokeless tobacco: Never Used  . Alcohol use No     Allergies   Patient has no known allergies.   Review of Systems Review of Systems  Constitutional: Negative for chills and fever.  Respiratory: Negative for shortness of breath.   Cardiovascular: Negative for chest pain.  Gastrointestinal: Negative for abdominal pain.  Genitourinary: Positive for flank pain.  Neurological: Positive for headaches. Negative for syncope, weakness, light-headedness and numbness.  All other systems reviewed and are negative.    Physical Exam Updated Vital Signs BP 112/73  (BP Location: Right Arm)   Pulse 77   Temp 97.8 F (36.6 C) (Oral)   Resp 18   Ht 5\' 8"  (1.727 m)   Wt 69.9 kg (154 lb 1.6 oz)   SpO2 96%   BMI 23.43 kg/m   Physical Exam  Constitutional: He is oriented to person, place, and time. He appears well-developed and well-nourished. No distress.  HENT:  Head: Normocephalic and atraumatic.  No tenderness to palpation of the face or skull  Eyes: Pupils are equal, round, and reactive to light. Conjunctivae and EOM are normal. Right eye exhibits no discharge. Left eye exhibits no discharge.  Neck: Normal range of motion. Neck supple. No JVD present. No tracheal deviation present.  Cardiovascular: Normal rate, regular rhythm, normal heart sounds and intact distal pulses.   Pulmonary/Chest: Effort normal and breath sounds normal. No respiratory distress. He has no wheezes. He has no rales.  Equal rise and fall of chest, no increased work of breathing, patient hesitant to take a deep breath due to pain in his left flank. No paradoxical wall motion. No anterior chest wall TTP.  Abdominal: Soft. Bowel sounds are normal. He exhibits no distension. There is no tenderness.  Musculoskeletal: He exhibits tenderness. He exhibits no edema.  Left flank maximally tender to palpation overlying T11 rib. Mild tenderness to palpation surrounding this area. 2cm area of ecchymosis overlying maximal tenderness. No midline spine TTP, no paraspinal muscle tenderness, no deformity, crepitus, or step-off noted. 5/5 strength of BUE and BLE major muscle groups  Neurological: He is alert and oriented to person, place, and time. No cranial nerve deficit or sensory deficit.  Fluent speech, no facial droop, sensation intact to soft touch of extremities, cranial nerves II through XII tested and intact.  Skin: Skin is warm and dry. No erythema.  Psychiatric: He has a normal mood and affect. His behavior is normal.  Nursing note and vitals reviewed.    ED Treatments / Results    Labs (all labs ordered are listed, but only abnormal results are displayed) Labs Reviewed - No data to display  EKG  EKG Interpretation None       Radiology Dg Ribs Unilateral W/chest Left  Result Date: 12/01/2016 CLINICAL DATA:  Fall, left flank pain EXAM: LEFT RIBS AND CHEST - 3+ VIEW COMPARISON:  None. FINDINGS: Lungs are clear.  No pleural effusion or pneumothorax. The heart is normal in size. No displaced left rib fracture is seen. IMPRESSION: No evidence of acute cardiopulmonary disease. No displaced left rib fracture is seen. Electronically Signed   By: Charline Bills M.D.   On: 12/01/2016 14:34    Procedures Procedures (including critical care time)  Medications Ordered in ED Medications  sodium chloride 0.9 % bolus 1,000 mL (not administered)  prochlorperazine (COMPAZINE) injection 10 mg (not administered)  diphenhydrAMINE (BENADRYL) injection 50 mg (not administered)  ketorolac (  TORADOL) 30 MG/ML injection 30 mg (not administered)     Initial Impression / Assessment and Plan / ED Course  I have reviewed the triage vital signs and the nursing notes.  Pertinent labs & imaging results that were available during my care of the patient were reviewed by me and considered in my medical decision making (see chart for details).     Patient with left flank pain reproducible on palpation after fall along a gait. X-ray shows no evidence of rib fracture or acute cardiopulmonary abnormality such as pneumothorax. Afebrile, vital signs are stable. He is also complaining of 3 days of migraine same as his usual migraine. No focal neurological deficits and no red flag signs concerning for SAH. No fever or neck stiffness to suggest meningitis. He has follow-up with a neurologist for Botox in 2 weeks. We will treat his migraine pain in the ED, and otherwise he is stable for discharge home with RICE therapy for his posterior rib pain. Will also discharge with incentive spirometer as he  is reluctant to take deep breaths to reduce the possibility of a pneumonia.   5:06 PM Patient awaiting migraine cocktail. Signed out to oncoming provider PA Maczis.   Final Clinical Impressions(s) / ED Diagnoses   Final diagnoses:  Acute left flank pain  Fall, initial encounter  Bad headache    New Prescriptions New Prescriptions   No medications on file     Bennye Alm 12/01/16 1707    Nira Conn, MD 12/02/16 [phone]

## 2016-12-11 DIAGNOSIS — G43009 Migraine without aura, not intractable, without status migrainosus: Secondary | ICD-10-CM | POA: Diagnosis not present

## 2016-12-15 DIAGNOSIS — R55 Syncope and collapse: Secondary | ICD-10-CM | POA: Diagnosis not present

## 2016-12-15 DIAGNOSIS — N200 Calculus of kidney: Secondary | ICD-10-CM | POA: Diagnosis not present

## 2016-12-18 DIAGNOSIS — R112 Nausea with vomiting, unspecified: Secondary | ICD-10-CM | POA: Diagnosis not present

## 2016-12-18 DIAGNOSIS — R1013 Epigastric pain: Secondary | ICD-10-CM | POA: Diagnosis not present

## 2016-12-25 DIAGNOSIS — N200 Calculus of kidney: Secondary | ICD-10-CM | POA: Diagnosis not present

## 2016-12-30 ENCOUNTER — Emergency Department (HOSPITAL_BASED_OUTPATIENT_CLINIC_OR_DEPARTMENT_OTHER)
Admission: EM | Admit: 2016-12-30 | Discharge: 2016-12-31 | Disposition: A | Discharge status: Home / Self Care | Payer: 59 | Attending: Emergency Medicine | Admitting: Emergency Medicine

## 2016-12-30 ENCOUNTER — Encounter (HOSPITAL_BASED_OUTPATIENT_CLINIC_OR_DEPARTMENT_OTHER): Payer: Self-pay | Admitting: *Deleted

## 2016-12-30 ENCOUNTER — Emergency Department (HOSPITAL_BASED_OUTPATIENT_CLINIC_OR_DEPARTMENT_OTHER): Payer: 59

## 2016-12-30 DIAGNOSIS — Z79899 Other long term (current) drug therapy: Secondary | ICD-10-CM | POA: Insufficient documentation

## 2016-12-30 DIAGNOSIS — R109 Unspecified abdominal pain: Secondary | ICD-10-CM

## 2016-12-30 DIAGNOSIS — R1032 Left lower quadrant pain: Secondary | ICD-10-CM | POA: Insufficient documentation

## 2016-12-30 DIAGNOSIS — F17210 Nicotine dependence, cigarettes, uncomplicated: Secondary | ICD-10-CM | POA: Insufficient documentation

## 2016-12-30 DIAGNOSIS — R1111 Vomiting without nausea: Secondary | ICD-10-CM | POA: Diagnosis not present

## 2016-12-30 MED ORDER — HALOPERIDOL LACTATE 5 MG/ML IJ SOLN
2.0000 mg | Freq: Once | INTRAMUSCULAR | Status: AC
  Administered 2016-12-31: 2 mg via INTRAMUSCULAR
  Filled 2016-12-30: qty 1

## 2016-12-30 MED ORDER — KETOROLAC TROMETHAMINE 30 MG/ML IJ SOLN
30.0000 mg | Freq: Once | INTRAMUSCULAR | Status: AC
  Administered 2016-12-31: 30 mg via INTRAVENOUS
  Filled 2016-12-30: qty 1

## 2016-12-30 NOTE — ED Notes (Signed)
C/o left flank pain x 6 days after lithotripsy   Also c/o cough

## 2016-12-30 NOTE — ED Triage Notes (Signed)
Pt c/o left flank pain x 1 week, lithotripsy x 5 days ago, reports he is out of oxycodone

## 2016-12-31 LAB — URINALYSIS, ROUTINE W REFLEX MICROSCOPIC
BILIRUBIN URINE: NEGATIVE
Glucose, UA: NEGATIVE mg/dL
Ketones, ur: NEGATIVE mg/dL
Nitrite: NEGATIVE
Protein, ur: NEGATIVE mg/dL
SPECIFIC GRAVITY, URINE: 1.01 (ref 1.005–1.030)
pH: 7 (ref 5.0–8.0)

## 2016-12-31 LAB — URINALYSIS, MICROSCOPIC (REFLEX)

## 2016-12-31 MED ORDER — DICLOFENAC SODIUM ER 100 MG PO TB24: 100.0000 mg | ORAL_TABLET | Freq: Every day | ORAL | 0 refills | Status: DC

## 2016-12-31 NOTE — ED Provider Notes (Signed)
[facility]   CSN: [id number] Arrival date & time: 12/30/16  2056     History   Chief Complaint Chief Complaint  Patient presents with  . Flank Pain    HPI Gregory Flores is a 36 y.o. male.  The history is provided by the patient.  Flank Pain  This is a recurrent problem. The current episode started more than 2 days ago. The problem occurs constantly. The problem has not changed since onset.Pertinent negatives include no chest pain, no abdominal pain, no headaches and no shortness of breath. Nothing aggravates the symptoms. Nothing relieves the symptoms. Treatments tried: percocet but he is out. The treatment provided no relief.  Had lithotripsy but feels stone is still stuck.    Past Medical History:  Diagnosis Date  . Arthritis   . Bulging lumbar disc   . Chronic lumbar pain   . Depression   . Drug-seeking behavior   . Kidney stone   . Kidney stones   . Night terrors, adult   . Renal disorder     Patient Active Problem List   Diagnosis Date Noted  . Other convulsions 01/04/2013  . NEPHROLITHIASIS, HX OF 04/15/2010  . ABDOMINAL PAIN, LEFT LOWER QUADRANT 04/01/2010    Past Surgical History:  Procedure Laterality Date  . BLADDER STONE REMOVAL    . LITHOTRIPSY         Home Medications    Prior to Admission medications   Medication Sig Start Date End Date Taking? Authorizing Provider  oxyCODONE-acetaminophen (PERCOCET/ROXICET) 5-325 MG tablet Take by mouth every 4 (four) hours as needed for severe pain.   Yes [provider]  cetirizine (ZYRTEC) 10 MG tablet Take 10 mg by mouth daily.    [provider]  ClonazePAM (KLONOPIN PO) Take 1 mg by mouth 2 (two) times daily.     [provider]  Diclofenac Sodium CR (VOLTAREN-XR) 100 MG 24 hr tablet Take 1 tablet (100 mg total) by mouth daily. 08/11/16   Glennice Marcos, MD  GABAPENTIN PO Take 400 mg by mouth 3 (three) times daily.     [provider]    HYDROcodone-acetaminophen (NORCO/VICODIN) 5-325 MG tablet Take 1 tablet by mouth every 6 (six) hours as needed for moderate pain. 07/27/16   Lawyer, Cristal Deer, PA-C  ibuprofen (ADVIL,MOTRIN) 800 MG tablet Take 1 tablet (800 mg total) by mouth every 8 (eight) hours as needed. 07/27/16   Lawyer, Cristal Deer, PA-C  lamiVUDine (EPIVIR) 150 MG tablet Take 150 mg by mouth 2 (two) times daily.    [provider]  methocarbamol (ROBAXIN) 500 MG tablet Take 1 tablet (500 mg total) by mouth 2 (two) times daily. 12/01/16   Maczis, Elmer Sow, PA-C  ondansetron (ZOFRAN ODT) 8 MG disintegrating tablet  ODT q8 hours prn nausea 08/11/16   Daily Crate, MD  promethazine (PHENERGAN) 25 MG tablet Take 1 tablet (25 mg total) by mouth every 6 (six) hours as needed for nausea. 11/11/16   Benjiman Core, MD  propranolol (INDERAL) 40 MG tablet Take 40 mg by mouth 3 (three) times daily.    [provider]  tamsulosin (FLOMAX) 0.4 MG CAPS capsule Take 1 capsule (0.4 mg total) by mouth daily. 08/11/16   Jaymee Tilson, MD    Family History History reviewed. No pertinent family history.  Social History Social History  Substance Use Topics  . Smoking status: Current Every Day Smoker    Packs/day: 0.50    Years: 7.00    Types:  Cigarettes  . Smokeless tobacco: Never Used  . Alcohol use No     Allergies   Patient has no known allergies.   Review of Systems Review of Systems  Constitutional: Negative for fever.  Respiratory: Negative for shortness of breath.   Cardiovascular: Negative for chest pain.  Gastrointestinal: Negative for abdominal pain.  Genitourinary: Positive for flank pain. Negative for dysuria and hematuria.  Neurological: Negative for headaches.  All other systems reviewed and are negative.    Physical Exam Updated Vital Signs BP 116/88 (BP Location: Left Arm)   Pulse 80   Temp 98.4 F (36.9 C)   Resp 18   Ht  (1.702 m)   Wt 68 kg (150 lb)   SpO2 99%   BMI  23.49 kg/m   Physical Exam  Constitutional: He is oriented to person, place, and time. He appears well-developed and well-nourished. No distress.  HENT:  Head: Normocephalic and atraumatic.  Mouth/Throat: No oropharyngeal exudate.  Eyes: Pupils are equal, round, and reactive to light. Conjunctivae are normal.  Neck: Normal range of motion. Neck supple.  Cardiovascular: Normal rate, regular rhythm, normal heart sounds and intact distal pulses.   Pulmonary/Chest: Effort normal and breath sounds normal. He has no wheezes. He has no rales.  Abdominal: Soft. Bowel sounds are normal. He exhibits no mass. There is no tenderness. There is no rebound and no guarding.  Musculoskeletal: Normal range of motion.  Neurological: He is alert and oriented to person, place, and time.  Skin: Skin is warm and dry. Capillary refill takes less than 2 seconds.  Psychiatric: He has a normal mood and affect.     ED Treatments / Results  Labs (all labs ordered are listed, but only abnormal results are displayed)  Results for orders placed or performed during the hospital encounter of 12/30/16  Urinalysis, Routine w reflex microscopic  Result Value Ref Range   Color, Urine YELLOW YELLOW   APPearance CLOUDY (A) CLEAR   Specific Gravity, Urine 1.010 1.005 - 1.030   pH 7.0 5.0 - 8.0   Glucose, UA NEGATIVE NEGATIVE mg/dL   Hgb urine dipstick LARGE (A) NEGATIVE   Bilirubin Urine NEGATIVE NEGATIVE   Ketones, ur NEGATIVE NEGATIVE mg/dL   Protein, ur NEGATIVE NEGATIVE mg/dL   Nitrite NEGATIVE NEGATIVE   Leukocytes, UA TRACE (A) NEGATIVE  Urinalysis, Microscopic (reflex)  Result Value Ref Range   RBC / HPF 6-30 0 - 5 RBC/hpf   WBC, UA 0-5 0 - 5 WBC/hpf   Bacteria, UA RARE (A) NONE SEEN   Squamous Epithelial / LPF 0-5 (A) NONE SEEN   Sperm, UA PRESENT    Dg Ribs Unilateral W/chest Left  Result Date: 12/01/2016 CLINICAL DATA:  Fall, left flank pain EXAM: LEFT RIBS AND CHEST - 3+ VIEW COMPARISON:  None.  FINDINGS: Lungs are clear.  No pleural effusion or pneumothorax. The heart is normal in size. No displaced left rib fracture is seen. IMPRESSION: No evidence of acute cardiopulmonary disease. No displaced left rib fracture is seen. Electronically Signed   By: Charline Bills M.D.   On: 12/01/2016 14:34   Ct Renal Stone Study  Result Date: 12/30/2016 CLINICAL DATA:  Left flank pain, hematuria, nausea and vomiting for 1 week. Lithotripsy last Friday. EXAM: CT ABDOMEN AND PELVIS WITHOUT CONTRAST TECHNIQUE: Multidetector CT imaging of the abdomen and pelvis was performed following the standard protocol without IV contrast. COMPARISON:  10/09/2016 FINDINGS: Lower chest: The lung bases are clear. Hepatobiliary: No focal liver  abnormality is seen. No gallstones, gallbladder wall thickening, or biliary dilatation. Pancreas: Unremarkable. No pancreatic ductal dilatation or surrounding inflammatory changes. Spleen: Normal in size without focal abnormality. Adrenals/Urinary Tract: No adrenal gland nodules. Bilateral intrarenal stones. Largest is in the midpole right kidney measuring 4 mm in diameter. No hydronephrosis or hydroureter. No ureteral stones. Bladder wall is not thickened. No bladder stones. Stomach/Bowel: Stomach, small bowel, and colon are not abnormally distended. No wall thickening is appreciated. Scattered stool throughout the colon. Appendix is normal. Vascular/Lymphatic: No significant vascular findings are present. No enlarged abdominal or pelvic lymph nodes. Reproductive: Prostate is unremarkable. Other: No abdominal wall hernia or abnormality. No abdominopelvic ascites. Musculoskeletal: No acute or significant osseous findings. IMPRESSION: Nonobstructing intrarenal stones bilaterally, similar to previous study. No ureteral stone or obstruction demonstrated. Electronically Signed   By: Burman Nieves M.D.   On: 12/30/2016 23:47    Radiology Ct Renal Stone Study  Result Date:  12/30/2016 CLINICAL DATA:  Left flank pain, hematuria, nausea and vomiting for 1 week. Lithotripsy last Friday. EXAM: CT ABDOMEN AND PELVIS WITHOUT CONTRAST TECHNIQUE: Multidetector CT imaging of the abdomen and pelvis was performed following the standard protocol without IV contrast. COMPARISON:  10/09/2016 FINDINGS: Lower chest: The lung bases are clear. Hepatobiliary: No focal liver abnormality is seen. No gallstones, gallbladder wall thickening, or biliary dilatation. Pancreas: Unremarkable. No pancreatic ductal dilatation or surrounding inflammatory changes. Spleen: Normal in size without focal abnormality. Adrenals/Urinary Tract: No adrenal gland nodules. Bilateral intrarenal stones. Largest is in the midpole right kidney measuring 4 mm in diameter. No hydronephrosis or hydroureter. No ureteral stones. Bladder wall is not thickened. No bladder stones. Stomach/Bowel: Stomach, small bowel, and colon are not abnormally distended. No wall thickening is appreciated. Scattered stool throughout the colon. Appendix is normal. Vascular/Lymphatic: No significant vascular findings are present. No enlarged abdominal or pelvic lymph nodes. Reproductive: Prostate is unremarkable. Other: No abdominal wall hernia or abnormality. No abdominopelvic ascites. Musculoskeletal: No acute or significant osseous findings. IMPRESSION: Nonobstructing intrarenal stones bilaterally, similar to previous study. No ureteral stone or obstruction demonstrated. Electronically Signed   By: Burman Nieves M.D.   On: 12/30/2016 23:47    Procedures Procedures (including critical care time)  Medications Ordered in ED Medications  ketorolac (TORADOL) 30 MG/ML injection 30 mg (30 mg Intravenous Given 12/31/16 0014)  haloperidol lactate (HALDOL) injection 2 mg (2 mg Intramuscular Given 12/31/16 0013)       Final Clinical Impressions(s) / ED Diagnoses  We gave toradol for stone while we were waiting for CT.  This patient has been here  seeking in the past and I suspect there is a reason he did not return to the institution that did the procedure.    Strict return precautions given for  chest pain, dyspnea on exertion, new weakness or numbness changes in vision or speech,  Inability to tolerate liquids or food, changes in voice cough, altered mental status or any concerns. No signs of systemic illness or infection. The patient is nontoxic-appearing on exam and vital signs are within normal limits.    I have reviewed the triage vital signs and the nursing notes. Pertinent labs &imaging results that were available during my care of the patient were reviewed by me and considered in my medical decision making (see chart for details).  After history, exam, and medical workup I feel the patient has been appropriately medically screened and is safe for discharge home. Pertinent diagnoses were discussed with the patient. Patient was given  return precautions.     Lillyan Hitson, MD 12/31/16 0530

## 2017-01-05 DIAGNOSIS — N200 Calculus of kidney: Secondary | ICD-10-CM | POA: Diagnosis not present

## 2017-01-06 DIAGNOSIS — R112 Nausea with vomiting, unspecified: Secondary | ICD-10-CM | POA: Diagnosis not present

## 2017-01-06 DIAGNOSIS — R197 Diarrhea, unspecified: Secondary | ICD-10-CM | POA: Diagnosis not present

## 2017-01-11 DIAGNOSIS — N200 Calculus of kidney: Secondary | ICD-10-CM | POA: Diagnosis not present

## 2017-01-11 DIAGNOSIS — R1031 Right lower quadrant pain: Secondary | ICD-10-CM | POA: Diagnosis not present

## 2017-01-11 DIAGNOSIS — R300 Dysuria: Secondary | ICD-10-CM | POA: Diagnosis not present

## 2017-01-11 DIAGNOSIS — R112 Nausea with vomiting, unspecified: Secondary | ICD-10-CM | POA: Diagnosis not present

## 2017-01-11 DIAGNOSIS — R109 Unspecified abdominal pain: Secondary | ICD-10-CM | POA: Diagnosis not present

## 2017-01-12 DIAGNOSIS — G43009 Migraine without aura, not intractable, without status migrainosus: Secondary | ICD-10-CM | POA: Diagnosis not present

## 2017-01-12 DIAGNOSIS — K529 Noninfective gastroenteritis and colitis, unspecified: Secondary | ICD-10-CM | POA: Diagnosis not present

## 2017-01-13 DIAGNOSIS — R1110 Vomiting, unspecified: Secondary | ICD-10-CM | POA: Diagnosis not present

## 2017-01-13 DIAGNOSIS — G43119 Migraine with aura, intractable, without status migrainosus: Secondary | ICD-10-CM | POA: Diagnosis not present

## 2017-01-18 DIAGNOSIS — N200 Calculus of kidney: Secondary | ICD-10-CM | POA: Diagnosis not present

## 2017-01-22 DIAGNOSIS — R109 Unspecified abdominal pain: Secondary | ICD-10-CM | POA: Diagnosis not present

## 2017-01-22 DIAGNOSIS — G43909 Migraine, unspecified, not intractable, without status migrainosus: Secondary | ICD-10-CM | POA: Diagnosis not present

## 2017-01-26 DIAGNOSIS — N200 Calculus of kidney: Secondary | ICD-10-CM | POA: Diagnosis not present

## 2017-02-22 DIAGNOSIS — S61412A Laceration without foreign body of left hand, initial encounter: Secondary | ICD-10-CM | POA: Diagnosis not present

## 2017-02-24 DIAGNOSIS — G43009 Migraine without aura, not intractable, without status migrainosus: Secondary | ICD-10-CM | POA: Diagnosis not present

## 2017-02-28 DIAGNOSIS — R51 Headache: Secondary | ICD-10-CM | POA: Diagnosis not present

## 2017-03-09 DIAGNOSIS — Q615 Medullary cystic kidney: Secondary | ICD-10-CM | POA: Diagnosis not present

## 2017-03-09 DIAGNOSIS — N200 Calculus of kidney: Secondary | ICD-10-CM | POA: Diagnosis not present

## 2017-05-05 DIAGNOSIS — H66002 Acute suppurative otitis media without spontaneous rupture of ear drum, left ear: Secondary | ICD-10-CM | POA: Diagnosis not present

## 2017-05-05 DIAGNOSIS — K1379 Other lesions of oral mucosa: Secondary | ICD-10-CM | POA: Diagnosis not present

## 2017-05-05 DIAGNOSIS — J029 Acute pharyngitis, unspecified: Secondary | ICD-10-CM | POA: Diagnosis not present

## 2017-05-28 DIAGNOSIS — J209 Acute bronchitis, unspecified: Secondary | ICD-10-CM | POA: Diagnosis not present

## 2017-06-03 DIAGNOSIS — N3001 Acute cystitis with hematuria: Secondary | ICD-10-CM | POA: Diagnosis not present

## 2017-06-03 DIAGNOSIS — N3091 Cystitis, unspecified with hematuria: Secondary | ICD-10-CM | POA: Diagnosis not present

## 2017-06-03 DIAGNOSIS — R110 Nausea: Secondary | ICD-10-CM | POA: Diagnosis not present

## 2017-06-03 DIAGNOSIS — J209 Acute bronchitis, unspecified: Secondary | ICD-10-CM | POA: Diagnosis not present

## 2017-06-10 DIAGNOSIS — J029 Acute pharyngitis, unspecified: Secondary | ICD-10-CM | POA: Diagnosis not present

## 2017-06-10 DIAGNOSIS — R112 Nausea with vomiting, unspecified: Secondary | ICD-10-CM | POA: Diagnosis not present

## 2017-06-10 DIAGNOSIS — J111 Influenza due to unidentified influenza virus with other respiratory manifestations: Secondary | ICD-10-CM | POA: Diagnosis not present

## 2017-07-07 DIAGNOSIS — G43019 Migraine without aura, intractable, without status migrainosus: Secondary | ICD-10-CM | POA: Diagnosis not present

## 2017-08-10 DIAGNOSIS — J209 Acute bronchitis, unspecified: Secondary | ICD-10-CM | POA: Diagnosis not present

## 2017-08-10 DIAGNOSIS — J111 Influenza due to unidentified influenza virus with other respiratory manifestations: Secondary | ICD-10-CM | POA: Diagnosis not present

## 2017-08-10 DIAGNOSIS — J0100 Acute maxillary sinusitis, unspecified: Secondary | ICD-10-CM | POA: Diagnosis not present

## 2017-08-30 ENCOUNTER — Other Ambulatory Visit: Payer: Self-pay

## 2017-08-30 ENCOUNTER — Emergency Department (HOSPITAL_BASED_OUTPATIENT_CLINIC_OR_DEPARTMENT_OTHER)
Admission: EM | Admit: 2017-08-30 | Discharge: 2017-08-30 | Disposition: A | Discharge status: Home / Self Care | Payer: 59 | Attending: Emergency Medicine | Admitting: Emergency Medicine

## 2017-08-30 ENCOUNTER — Encounter (HOSPITAL_BASED_OUTPATIENT_CLINIC_OR_DEPARTMENT_OTHER): Payer: Self-pay | Admitting: *Deleted

## 2017-08-30 ENCOUNTER — Emergency Department (HOSPITAL_BASED_OUTPATIENT_CLINIC_OR_DEPARTMENT_OTHER): Payer: 59

## 2017-08-30 DIAGNOSIS — R109 Unspecified abdominal pain: Secondary | ICD-10-CM | POA: Insufficient documentation

## 2017-08-30 DIAGNOSIS — F17210 Nicotine dependence, cigarettes, uncomplicated: Secondary | ICD-10-CM | POA: Insufficient documentation

## 2017-08-30 DIAGNOSIS — Z79899 Other long term (current) drug therapy: Secondary | ICD-10-CM | POA: Diagnosis not present

## 2017-08-30 DIAGNOSIS — N200 Calculus of kidney: Secondary | ICD-10-CM | POA: Insufficient documentation

## 2017-08-30 DIAGNOSIS — R319 Hematuria, unspecified: Secondary | ICD-10-CM | POA: Diagnosis not present

## 2017-08-30 LAB — BASIC METABOLIC PANEL
Anion gap: 10 (ref 5–15)
BUN: 13 mg/dL (ref 6–20)
CALCIUM: 9.3 mg/dL (ref 8.9–10.3)
CO2: 28 mmol/L (ref 22–32)
Chloride: 102 mmol/L (ref 101–111)
Creatinine, Ser: 1.11 mg/dL (ref 0.61–1.24)
GFR calc Af Amer: >60 mL/min (ref >60)
Glucose, Bld: 89 mg/dL (ref 65–99)
POTASSIUM: 4 mmol/L (ref 3.5–5.1)
SODIUM: 140 mmol/L (ref 135–145)

## 2017-08-30 LAB — CBC WITH DIFFERENTIAL/PLATELET
BASOS ABS: 0 10*3/uL (ref 0.0–0.1)
Basophils Relative: 0 %
EOS ABS: 0.2 10*3/uL (ref 0.0–0.7)
EOS PCT: 3 %
HCT: 43.9 % (ref 39.0–52.0)
Hemoglobin: 15.3 g/dL (ref 13.0–17.0)
LYMPHS ABS: 2.7 10*3/uL (ref 0.7–4.0)
Lymphocytes Relative: 39 %
MCH: 31.4 pg (ref 26.0–34.0)
MCHC: 34.9 g/dL (ref 30.0–36.0)
MCV: 90.1 fL (ref 78.0–100.0)
Monocytes Absolute: 0.7 10*3/uL (ref 0.1–1.0)
Monocytes Relative: 9 %
Neutro Abs: 3.4 10*3/uL (ref 1.7–7.7)
Neutrophils Relative %: 49 %
PLATELETS: 296 10*3/uL (ref 150–400)
RBC: 4.87 MIL/uL (ref 4.22–5.81)
RDW: 13.5 % (ref 11.5–15.5)
WBC: 7 10*3/uL (ref 4.0–10.5)

## 2017-08-30 LAB — URINALYSIS, ROUTINE W REFLEX MICROSCOPIC
Bilirubin Urine: NEGATIVE
GLUCOSE, UA: NEGATIVE mg/dL
Ketones, ur: NEGATIVE mg/dL
LEUKOCYTES UA: NEGATIVE
Nitrite: NEGATIVE
PH: 7 (ref 5.0–8.0)
Protein, ur: NEGATIVE mg/dL
SPECIFIC GRAVITY, URINE: 1.015 (ref 1.005–1.030)

## 2017-08-30 LAB — URINALYSIS, MICROSCOPIC (REFLEX)

## 2017-08-30 MED ORDER — IBUPROFEN 400 MG PO TABS
400.0000 mg | ORAL_TABLET | Freq: Once | ORAL | Status: AC
  Administered 2017-08-30: 400 mg via ORAL
  Filled 2017-08-30: qty 1

## 2017-08-30 MED ORDER — SODIUM CHLORIDE 0.9 % IV BOLUS
500.0000 mL | Freq: Once | INTRAVENOUS | Status: AC
  Administered 2017-08-30: 500 mL via INTRAVENOUS

## 2017-08-30 MED ORDER — HYDROMORPHONE HCL 1 MG/ML IJ SOLN
0.5000 mg | Freq: Once | INTRAMUSCULAR | Status: AC
  Administered 2017-08-30: 0.5 mg via INTRAVENOUS
  Filled 2017-08-30: qty 1

## 2017-08-30 MED ORDER — HYDROCODONE-ACETAMINOPHEN 5-325 MG PO TABS: 2.0000 | ORAL_TABLET | ORAL | 0 refills | Status: DC | PRN

## 2017-08-30 MED ORDER — ONDANSETRON HCL 4 MG/2ML IJ SOLN
4.0000 mg | Freq: Once | INTRAMUSCULAR | Status: AC
Start: 2017-08-30 — End: 2017-08-30
  Administered 2017-08-30: 4 mg via INTRAVENOUS
  Filled 2017-08-30: qty 2

## 2017-08-30 MED ORDER — KETOROLAC TROMETHAMINE 30 MG/ML IJ SOLN
15.0000 mg | Freq: Once | INTRAMUSCULAR | Status: AC
  Administered 2017-08-30: 15 mg via INTRAVENOUS
  Filled 2017-08-30: qty 1

## 2017-08-30 NOTE — ED Provider Notes (Signed)
[facility] EMERGENCY DEPARTMENT Provider Note   CSN: [id number] Arrival date & time: 08/30/17  1456     History   Chief Complaint Chief Complaint  Patient presents with  . Flank Pain    HPI Gregory Flores is a 37 y.o. male.  HPI 37 year old Caucasian male past medical history significant for ureteral stones presents to the emergency department today for evaluation of left flank pain.  Patient is followed by urology and has had several lithotripsies performed.  Patient states that he developed left-sided flank pain 3 days ago.  States he try to control the pain at home but was not able to do so.  Patient states he was not able to see his urologist today.  He reports hematuria, dysuria, urgency and frequency.  Patient denies any testicular pain or swelling.  Denies any abdominal pain.  Reports mild nausea but denies any emesis.  Patient denies any associated fevers and chills currently.  Patient has been taking ibuprofen with some relief of the symptoms.  Also took at home hydrocodone with some relief.  Patient states this feels typical of his ureteral stones.  Nothing makes his symptoms better.  Pt denies any fever, chill, ha, vision changes, lightheadedness, dizziness, congestion, neck pain, cp, sob, cough, abd pain, , change in bowel habits, melena, hematochezia, lower extremity paresthesias.  Past Medical History:  Diagnosis Date  . Arthritis   . Bulging lumbar disc   . Chronic lumbar pain   . Depression   . Drug-seeking behavior   . Kidney stone   . Kidney stones   . Night terrors, adult   . Renal disorder     Patient Active Problem List   Diagnosis Date Noted  . Other convulsions 01/04/2013  . NEPHROLITHIASIS, HX OF 04/15/2010  . ABDOMINAL PAIN, LEFT LOWER QUADRANT 04/01/2010    Past Surgical History:  Procedure Laterality Date  . BLADDER STONE REMOVAL    . LITHOTRIPSY          Home Medications    Prior to Admission medications   Medication Sig  Start Date End Date Taking? Authorizing Provider  amphetamine-dextroamphetamine (ADDERALL) 20 MG tablet  12/03/16  Yes [provider]  clonazePAM (KLONOPIN) 1 MG tablet  08/29/17  Yes [provider]  Diclofenac Sodium CR (VOLTAREN-XR) 100 MG 24 hr tablet Take 1 tablet (100 mg total) by mouth daily. 12/31/16  Yes Palumbo, April, MD  divalproex (DEPAKOTE ER) 500 MG 24 hr tablet 3 tabs po bid 11/16/16  Yes [provider]  gabapentin (NEURONTIN) 600 MG tablet Take 600 mg by mouth 3 (three) times daily. 07/27/17  Yes [provider]  lamoTRIgine (LAMICTAL) 100 MG tablet Take 100 mg by mouth every morning. 07/06/17  Yes [provider]  SUMAtriptan (IMITREX) 100 MG tablet  08/13/17  Yes [provider]  VENTOLIN HFA 108 (90 Base) MCG/ACT inhaler  06/03/17  Yes [provider]  VIIBRYD 40 MG TABS Take 40 mg by mouth every morning. 06/17/17  Yes [provider]  cetirizine (ZYRTEC) 10 MG tablet Take 10 mg by mouth daily.    [provider]  ClonazePAM (KLONOPIN PO) Take 1 mg by mouth 2 (two) times daily.     [provider]  GABAPENTIN PO Take 400 mg by mouth 3 (three) times daily.     [provider]  HYDROcodone-acetaminophen (NORCO/VICODIN) 5-325 MG tablet Take 1 tablet by mouth every 6 (six) hours as needed for moderate pain. 07/27/16   Lawyer,  Christopher, PA-C  ibuprofen (ADVIL,MOTRIN) 800 MG tablet Take 1 tablet (800 mg total) by mouth every 8 (eight) hours as needed. 07/27/16   Lawyer, Cristal Deer, PA-C  lamiVUDine (EPIVIR) 150 MG tablet Take 150 mg by mouth 2 (two) times daily.    [provider]  methocarbamol (ROBAXIN) 500 MG tablet Take 1 tablet (500 mg total) by mouth 2 (two) times daily. 12/01/16   Maczis, Elmer Sow, PA-C  ondansetron (ZOFRAN ODT) 8 MG disintegrating tablet  ODT q8 hours prn nausea 08/11/16   Palumbo, April, MD  oxyCODONE-acetaminophen (PERCOCET/ROXICET) 5-325 MG tablet Take by  mouth every 4 (four) hours as needed for severe pain.    [provider]  promethazine (PHENERGAN) 25 MG tablet Take 1 tablet (25 mg total) by mouth every 6 (six) hours as needed for nausea. 11/11/16   Benjiman Core, MD  propranolol (INDERAL) 40 MG tablet Take 40 mg by mouth 3 (three) times daily.    [provider]  tamsulosin (FLOMAX) 0.4 MG CAPS capsule Take 1 capsule (0.4 mg total) by mouth daily. 08/11/16   Palumbo, April, MD    Family History No family history on file.  Social History Social History   Tobacco Use  . Smoking status: Current Every Day Smoker    Packs/day: 0.50    Years: 7.00    Pack years: 3.50    Types: Cigarettes  . Smokeless tobacco: Never Used  Substance Use Topics  . Alcohol use: No  . Drug use: No     Allergies   Patient has no known allergies.   Review of Systems Review of Systems  All other systems reviewed and are negative.    Physical Exam Updated Vital Signs BP (!) 136/98   Pulse 78   Temp 98.2 F (36.8 C) (Oral)   Resp 18   Ht  (1.702 m)   Wt 72.6 kg (160 lb)   SpO2 95%   BMI 25.06 kg/m   Physical Exam  Constitutional: He is oriented to person, place, and time. He appears well-developed and well-nourished.  Non-toxic appearance. He appears distressed (secondary to pain).  HENT:  Head: Normocephalic and atraumatic.  Mouth/Throat: Oropharynx is clear and moist.  Eyes: Pupils are equal, round, and reactive to light. Conjunctivae are normal. Right eye exhibits no discharge. Left eye exhibits no discharge.  Neck: Normal range of motion. Neck supple.  Cardiovascular: Normal rate, regular rhythm, normal heart sounds and intact distal pulses. Exam reveals no gallop and no friction rub.  No murmur heard. Pulmonary/Chest: Effort normal and breath sounds normal. No stridor. No respiratory distress. He has no wheezes. He has no rales. He exhibits no tenderness.  Abdominal: Soft. Bowel sounds are normal. He exhibits  no distension. There is no tenderness. There is CVA tenderness (left). There is no rebound and no guarding.  Musculoskeletal: Normal range of motion. He exhibits no tenderness.  Lymphadenopathy:    He has no cervical adenopathy.  Neurological: He is alert and oriented to person, place, and time.  Skin: Skin is warm and dry. Capillary refill takes less than 2 seconds. No rash noted.  Psychiatric: His behavior is normal. Judgment and thought content normal.  Nursing note and vitals reviewed.    ED Treatments / Results  Labs (all labs ordered are listed, but only abnormal results are displayed) Labs Reviewed  URINALYSIS, ROUTINE W REFLEX MICROSCOPIC - Abnormal; Notable for the following components:      Result Value   APPearance CLOUDY (*)  Hgb urine dipstick TRACE (*)    All other components within normal limits  URINALYSIS, MICROSCOPIC (REFLEX) - Abnormal; Notable for the following components:   Bacteria, UA RARE (*)    All other components within normal limits  CBC WITH DIFFERENTIAL/PLATELET  BASIC METABOLIC PANEL    EKG None  Radiology No results found.  Procedures Procedures (including critical care time)  Medications Ordered in ED Medications  ibuprofen (ADVIL,MOTRIN) tablet 400 mg (400 mg Oral Given 08/30/17 1516)  HYDROmorphone (DILAUDID) injection 0.5 mg (0.5 mg Intravenous Given 08/30/17 1803)  ondansetron (ZOFRAN) injection 4 mg (4 mg Intravenous Given 08/30/17 1801)  sodium chloride 0.9 % bolus 500 mL (0 mLs Intravenous Stopped 08/30/17 1840)     Initial Impression / Assessment and Plan / ED Course  I have reviewed the triage vital signs and the nursing notes.  Pertinent labs & imaging results that were available during my care of the patient were reviewed by me and considered in my medical decision making (see chart for details).     Patient with history of recurrent kidney stones presents to the emergency department today with left flank pain consistent  with kidney stone pain.  Unable see his urologist today.  Patient reports urinary symptoms.  Denies any associated abdominal pain or fevers.  Appears uncomfortable secondary to pain.  Vital signs reassuring.  Patient afebrile.  Lab work reassuring.  No leukocytosis.  Normal kidney function.  UA shows trace hemoglobin but no signs of acute infection.  Patient had recurrent kidney stones will avoid CT scan.  Will order renal ultrasound to assess for hydronephrosis.  Patient's pain will be controlled in the ED and patient will be reassessed.  I did review patient's urology notes.  Patient does have a history of drug-seeking behavior.  He does have a history of ureteral stones.  Renal ultrasound is pending at this time.  Patient will be given a short course of pain medication if renal ultrasound is normal to go home with until follow-up with his urologist this week.  Patient has no intractable vomiting.  No elevated creatinine and has no fever in the ED.  Review patient's narcotic database that shows several prescriptions for Adderall and clonazepam.  No recent narcotic medication noted.  Care handoff to PA [facility]. Pt has pending at this time renal US.  Disposition likely homepending lab and test results. Care dicussed and plan agreed upon with oncoming PA. Pt updated on plan of care and is currently hemodynamically stable at this time with normal vs.     Final Clinical Impressions(s) / ED Diagnoses   Final diagnoses:  Left flank pain  Hematuria, unspecified type    ED Discharge Orders    None       Wallace Keller 08/31/17 0857    Vanetta Mulders, MD 09/06/17 [phone]

## 2017-08-30 NOTE — ED Notes (Signed)
Patient transported to Ultrasound 

## 2017-08-30 NOTE — ED Notes (Signed)
ED Provider at bedside. 

## 2017-08-30 NOTE — ED Provider Notes (Signed)
Received signout from [employer], PA-C.  This is a 37 year old male with history of recurrent kidney stones as well as history of drug-seeking behavior presenting for evaluation of left flank pain.  I will see follow-up on his renal ultrasound study.  Study shows bilateral nephrolithiasis without obstructive uropathy.  Patient discharged home with pain medication that was written by Joselyn Glassman.  He will follow-up with his urologist as needed.  Patient is stable for discharge.  BP (!) 153/88   Pulse (!) 106   Temp 98.7 F (37.1 C) (Oral)   Resp 18   Ht  (1.702 m)   Wt 72.6 kg (160 lb)   SpO2 97%   BMI 25.06 kg/m   Results for orders placed or performed during the hospital encounter of 08/30/17  Urinalysis, Routine w reflex microscopic  Result Value Ref Range   Color, Urine YELLOW YELLOW   APPearance CLOUDY (A) CLEAR   Specific Gravity, Urine 1.015 1.005 - 1.030   pH 7.0 5.0 - 8.0   Glucose, UA NEGATIVE NEGATIVE mg/dL   Hgb urine dipstick TRACE (A) NEGATIVE   Bilirubin Urine NEGATIVE NEGATIVE   Ketones, ur NEGATIVE NEGATIVE mg/dL   Protein, ur NEGATIVE NEGATIVE mg/dL   Nitrite NEGATIVE NEGATIVE   Leukocytes, UA NEGATIVE NEGATIVE  Urinalysis, Microscopic (reflex)  Result Value Ref Range   RBC / HPF 0-5 0 - 5 RBC/hpf   WBC, UA 0-5 0 - 5 WBC/hpf   Bacteria, UA RARE (A) NONE SEEN   Squamous Epithelial / LPF 0-5 0 - 5   Mucus PRESENT    Amorphous Crystal PRESENT   CBC with Differential  Result Value Ref Range   WBC 7.0 4.0 - 10.5 K/uL   RBC 4.87 4.22 - 5.81 MIL/uL   Hemoglobin 15.3 13.0 - 17.0 g/dL   HCT 16.1 09.6 - 04.5 %   MCV 90.1 78.0 - 100.0 fL   MCH 31.4 26.0 - 34.0 pg   MCHC 34.9 30.0 - 36.0 g/dL   RDW 40.9 81.1 - 91.4 %   Platelets 296 150 - 400 K/uL   Neutrophils Relative % 49 %   Neutro Abs 3.4 1.7 - 7.7 K/uL   Lymphocytes Relative 39 %   Lymphs Abs 2.7 0.7 - 4.0 K/uL   Monocytes Relative 9 %   Monocytes Absolute 0.7 0.1 - 1.0 K/uL   Eosinophils Relative 3  %   Eosinophils Absolute 0.2 0.0 - 0.7 K/uL   Basophils Relative 0 %   Basophils Absolute 0.0 0.0 - 0.1 K/uL  Basic metabolic panel  Result Value Ref Range   Sodium 140 135 - 145 mmol/L   Potassium 4.0 3.5 - 5.1 mmol/L   Chloride 102 101 - 111 mmol/L   CO2 28 22 - 32 mmol/L   Glucose, Bld 89 65 - 99 mg/dL   BUN 13 6 - 20 mg/dL   Creatinine, Ser 7.82 0.61 - 1.24 mg/dL   Calcium 9.3 8.9 - 95.6 mg/dL   GFR calc non Af Amer >60 >60 mL/min   GFR calc Af Amer >60 >60 mL/min   Anion gap 10 5 - 15   US Renal  Result Date: 08/30/2017 CLINICAL DATA:  Left flank pain x1 week. EXAM: RENAL / URINARY TRACT ULTRASOUND COMPLETE COMPARISON:  CT from 01/11/2017 FINDINGS: Right Kidney: Length: 9.5 cm. Echogenicity within normal limits. Nonobstructing echogenic calculi are noted the largest measuring approximately 6 mm in the lower pole. No mass or hydronephrosis visualized. Left Kidney: Length: 10.7 cm.  Echogenicity within normal limits. Multiple nonobstructing renal calculi are identified the largest measuring 6 mm in the upper pole. No mass or hydronephrosis visualized. Bladder: Appears normal for degree of bladder distention. Low level echoes within the bladder compatible with debris are identified. Ureteral jets are demonstrated bilaterally suggesting patency of both ureters. IMPRESSION: 1. Bilateral nephrolithiasis without obstructive uropathy. 2. Minimal sediment or debris within the bladder. Electronically Signed   By: Tollie Eth M.D.   On: 08/30/2017 21:33      Fayrene Helper, PA-C 08/30/17 2144    Vanetta Mulders, MD 09/06/17 [phone]

## 2017-08-30 NOTE — Discharge Instructions (Addendum)
Please make sure you follow-up with your urologist tomorrow.  Your lab work and imaging has been reassuring.  We will give you short course of pain medication.  Would recommend you taking scheduled ibuprofen and Tylenol at home.  Return the ED with any and vomiting, fevers or for any other reason.

## 2017-08-30 NOTE — ED Triage Notes (Signed)
Left flank pain x 3 days. States he has a hx of stones that he sees a urologist for. He was not able to see his urologist today.

## 2017-09-02 DIAGNOSIS — R109 Unspecified abdominal pain: Secondary | ICD-10-CM | POA: Diagnosis not present

## 2017-09-14 DIAGNOSIS — N200 Calculus of kidney: Secondary | ICD-10-CM | POA: Diagnosis not present

## 2017-09-23 DIAGNOSIS — K529 Noninfective gastroenteritis and colitis, unspecified: Secondary | ICD-10-CM | POA: Diagnosis not present

## 2017-09-23 DIAGNOSIS — R51 Headache: Secondary | ICD-10-CM | POA: Diagnosis not present

## 2017-09-30 DIAGNOSIS — G43809 Other migraine, not intractable, without status migrainosus: Secondary | ICD-10-CM | POA: Diagnosis not present

## 2017-09-30 DIAGNOSIS — R1031 Right lower quadrant pain: Secondary | ICD-10-CM | POA: Diagnosis not present

## 2017-09-30 DIAGNOSIS — R110 Nausea: Secondary | ICD-10-CM | POA: Diagnosis not present

## 2017-10-21 DIAGNOSIS — M25512 Pain in left shoulder: Secondary | ICD-10-CM | POA: Diagnosis not present

## 2017-10-21 DIAGNOSIS — M79602 Pain in left arm: Secondary | ICD-10-CM | POA: Diagnosis not present

## 2017-10-26 DIAGNOSIS — M25512 Pain in left shoulder: Secondary | ICD-10-CM | POA: Diagnosis not present

## 2017-10-26 DIAGNOSIS — R110 Nausea: Secondary | ICD-10-CM | POA: Diagnosis not present

## 2017-10-26 DIAGNOSIS — S46912D Strain of unspecified muscle, fascia and tendon at shoulder and upper arm level, left arm, subsequent encounter: Secondary | ICD-10-CM | POA: Diagnosis not present

## 2017-11-02 DIAGNOSIS — S46912D Strain of unspecified muscle, fascia and tendon at shoulder and upper arm level, left arm, subsequent encounter: Secondary | ICD-10-CM | POA: Diagnosis not present

## 2017-11-02 DIAGNOSIS — G43909 Migraine, unspecified, not intractable, without status migrainosus: Secondary | ICD-10-CM | POA: Diagnosis not present

## 2017-11-02 DIAGNOSIS — M25512 Pain in left shoulder: Secondary | ICD-10-CM | POA: Diagnosis not present

## 2017-11-10 DIAGNOSIS — Z0000 Encounter for general adult medical examination without abnormal findings: Secondary | ICD-10-CM | POA: Diagnosis not present

## 2017-11-16 DIAGNOSIS — G43001 Migraine without aura, not intractable, with status migrainosus: Secondary | ICD-10-CM | POA: Diagnosis not present

## 2017-11-25 DIAGNOSIS — Z87891 Personal history of nicotine dependence: Secondary | ICD-10-CM | POA: Diagnosis not present

## 2017-11-25 DIAGNOSIS — E559 Vitamin D deficiency, unspecified: Secondary | ICD-10-CM | POA: Diagnosis not present

## 2017-11-25 DIAGNOSIS — M79645 Pain in left finger(s): Secondary | ICD-10-CM | POA: Diagnosis not present

## 2017-11-25 DIAGNOSIS — E7800 Pure hypercholesterolemia, unspecified: Secondary | ICD-10-CM | POA: Diagnosis not present

## 2017-12-20 DIAGNOSIS — J018 Other acute sinusitis: Secondary | ICD-10-CM | POA: Diagnosis not present

## 2017-12-20 DIAGNOSIS — J209 Acute bronchitis, unspecified: Secondary | ICD-10-CM | POA: Diagnosis not present

## 2017-12-20 DIAGNOSIS — J4541 Moderate persistent asthma with (acute) exacerbation: Secondary | ICD-10-CM | POA: Diagnosis not present

## 2017-12-24 DIAGNOSIS — S46912D Strain of unspecified muscle, fascia and tendon at shoulder and upper arm level, left arm, subsequent encounter: Secondary | ICD-10-CM | POA: Diagnosis not present

## 2017-12-24 DIAGNOSIS — S6992XD Unspecified injury of left wrist, hand and finger(s), subsequent encounter: Secondary | ICD-10-CM | POA: Diagnosis not present

## 2017-12-24 DIAGNOSIS — G43909 Migraine, unspecified, not intractable, without status migrainosus: Secondary | ICD-10-CM | POA: Diagnosis not present

## 2017-12-25 ENCOUNTER — Emergency Department (HOSPITAL_COMMUNITY): Payer: 59

## 2017-12-25 ENCOUNTER — Other Ambulatory Visit: Payer: Self-pay

## 2017-12-25 ENCOUNTER — Emergency Department (HOSPITAL_COMMUNITY)
Admission: EM | Admit: 2017-12-25 | Discharge: 2017-12-26 | Disposition: A | Discharge status: Home / Self Care | Payer: 59 | Attending: Emergency Medicine | Admitting: Emergency Medicine

## 2017-12-25 ENCOUNTER — Encounter (HOSPITAL_COMMUNITY): Payer: Self-pay | Admitting: Emergency Medicine

## 2017-12-25 DIAGNOSIS — F17210 Nicotine dependence, cigarettes, uncomplicated: Secondary | ICD-10-CM | POA: Diagnosis not present

## 2017-12-25 DIAGNOSIS — R52 Pain, unspecified: Secondary | ICD-10-CM | POA: Diagnosis not present

## 2017-12-25 DIAGNOSIS — R109 Unspecified abdominal pain: Secondary | ICD-10-CM

## 2017-12-25 DIAGNOSIS — N200 Calculus of kidney: Secondary | ICD-10-CM | POA: Diagnosis not present

## 2017-12-25 DIAGNOSIS — Z79899 Other long term (current) drug therapy: Secondary | ICD-10-CM | POA: Insufficient documentation

## 2017-12-25 DIAGNOSIS — R1032 Left lower quadrant pain: Secondary | ICD-10-CM | POA: Diagnosis present

## 2017-12-25 DIAGNOSIS — M545 Low back pain, unspecified: Secondary | ICD-10-CM

## 2017-12-25 DIAGNOSIS — G8929 Other chronic pain: Secondary | ICD-10-CM

## 2017-12-25 DIAGNOSIS — R0689 Other abnormalities of breathing: Secondary | ICD-10-CM | POA: Diagnosis not present

## 2017-12-25 DIAGNOSIS — R000 Tachycardia, unspecified: Secondary | ICD-10-CM | POA: Diagnosis not present

## 2017-12-25 LAB — URINALYSIS, ROUTINE W REFLEX MICROSCOPIC
Bacteria, UA: NONE SEEN
Glucose, UA: NEGATIVE mg/dL
Ketones, ur: NEGATIVE mg/dL
LEUKOCYTES UA: NEGATIVE
NITRITE: NEGATIVE
Protein, ur: 30 mg/dL — AB
SPECIFIC GRAVITY, URINE: 1.027 (ref 1.005–1.030)
pH: 6 (ref 5.0–8.0)

## 2017-12-25 LAB — RAPID URINE DRUG SCREEN, HOSP PERFORMED
AMPHETAMINES: POSITIVE — AB
Barbiturates: NOT DETECTED
Benzodiazepines: POSITIVE — AB
Cocaine: NOT DETECTED
Opiates: NOT DETECTED
TETRAHYDROCANNABINOL: POSITIVE — AB

## 2017-12-25 MED ORDER — SODIUM CHLORIDE 0.9 % IV BOLUS
1000.0000 mL | Freq: Once | INTRAVENOUS | Status: AC
  Administered 2017-12-26: 1000 mL via INTRAVENOUS

## 2017-12-25 MED ORDER — METHOCARBAMOL 500 MG PO TABS
1000.0000 mg | ORAL_TABLET | Freq: Once | ORAL | Status: AC
  Administered 2017-12-25: 1000 mg via ORAL
  Filled 2017-12-25: qty 2

## 2017-12-25 MED ORDER — KETOROLAC TROMETHAMINE 30 MG/ML IJ SOLN
30.0000 mg | Freq: Once | INTRAMUSCULAR | Status: AC
  Administered 2017-12-25: 30 mg via INTRAVENOUS
  Filled 2017-12-25: qty 1

## 2017-12-25 MED ORDER — HYDROMORPHONE HCL 1 MG/ML IJ SOLN
1.0000 mg | Freq: Once | INTRAMUSCULAR | Status: AC
  Administered 2017-12-25: 1 mg via INTRAVENOUS
  Filled 2017-12-25: qty 1

## 2017-12-25 MED ORDER — LORAZEPAM 2 MG/ML IJ SOLN
1.0000 mg | Freq: Once | INTRAMUSCULAR | Status: AC
  Administered 2017-12-25: 1 mg via INTRAVENOUS
  Filled 2017-12-25: qty 1

## 2017-12-25 NOTE — ED Provider Notes (Addendum)
[facility]-EMERGENCY DEPT Provider Note   CSN: [id number] Arrival date & time: 12/25/17  2107     History   Chief Complaint Chief Complaint  Patient presents with  . Flank Pain    HPI Gregory Flores is a 37 y.o. male.  Patient c/o severe left flank pain that he state feels same as prior kidney stone pain. Pain acute onset, constant, severe, non radiating, left flank posteriorly. No dysuria or hematuria. No trauma to area. No radicular pain. No numbness/weakness. No fever or chills.   The history is provided by the patient.  Flank Pain  Pertinent negatives include no chest pain, no abdominal pain, no headaches and no shortness of breath.    Past Medical History:  Diagnosis Date  . Arthritis   . Bulging lumbar disc   . Chronic lumbar pain   . Depression   . Drug-seeking behavior   . Kidney stone   . Kidney stones   . Night terrors, adult   . Renal disorder     Patient Active Problem List   Diagnosis Date Noted  . Other convulsions 01/04/2013  . NEPHROLITHIASIS, HX OF 04/15/2010  . ABDOMINAL PAIN, LEFT LOWER QUADRANT 04/01/2010    Past Surgical History:  Procedure Laterality Date  . BLADDER STONE REMOVAL    . LITHOTRIPSY          Home Medications    Prior to Admission medications   Medication Sig Start Date End Date Taking? Authorizing Provider  amphetamine-dextroamphetamine (ADDERALL) 20 MG tablet  12/03/16   [provider]  cetirizine (ZYRTEC) 10 MG tablet Take 10 mg by mouth daily.    [provider]  ClonazePAM (KLONOPIN PO) Take 1 mg by mouth 2 (two) times daily.     [provider]  clonazePAM Scarlette Calico) 1 MG tablet  08/29/17   [provider]  Diclofenac Sodium CR (VOLTAREN-XR) 100 MG 24 hr tablet Take 1 tablet (100 mg total) by mouth daily. 12/31/16   Palumbo, April, MD  divalproex (DEPAKOTE ER) 500 MG 24 hr tablet 3 tabs po bid 11/16/16   [provider]  gabapentin (NEURONTIN) 600 MG  tablet Take 600 mg by mouth 3 (three) times daily. 07/27/17   [provider]  GABAPENTIN PO Take 400 mg by mouth 3 (three) times daily.     [provider]  HYDROcodone-acetaminophen (NORCO/VICODIN) 5-325 MG tablet Take 2 tablets by mouth every 4 (four) hours as needed. 08/30/17   Rise Mu, PA-C  ibuprofen (ADVIL,MOTRIN) 800 MG tablet Take 1 tablet (800 mg total) by mouth every 8 (eight) hours as needed. 07/27/16   Lawyer, Cristal Deer, PA-C  lamiVUDine (EPIVIR) 150 MG tablet Take 150 mg by mouth 2 (two) times daily.    [provider]  lamoTRIgine (LAMICTAL) 100 MG tablet Take 100 mg by mouth every morning. 07/06/17   [provider]  methocarbamol (ROBAXIN) 500 MG tablet Take 1 tablet (500 mg total) by mouth 2 (two) times daily. 12/01/16   Maczis, Elmer Sow, PA-C  ondansetron (ZOFRAN ODT) 8 MG disintegrating tablet 8mg  ODT q8 hours prn nausea 08/11/16   Palumbo, April, MD  oxyCODONE-acetaminophen (PERCOCET/ROXICET) 5-325 MG tablet Take by mouth every 4 (four) hours as needed for severe pain.    [provider]  promethazine (PHENERGAN) 25 MG tablet Take 1 tablet (25 mg total) by mouth every 6 (six) hours as needed for nausea. 11/11/16   Benjiman Core, MD  propranolol (INDERAL) 40 MG tablet Take 40  mg by mouth 3 (three) times daily.    [provider]  SUMAtriptan (IMITREX) 100 MG tablet  08/13/17   [provider]  tamsulosin (FLOMAX) 0.4 MG CAPS capsule Take 1 capsule (0.4 mg total) by mouth daily. 08/11/16   Palumbo, April, MD  VENTOLIN HFA 108 (90 Base) MCG/ACT inhaler  06/03/17   [provider]  VIIBRYD 40 MG TABS Take 40 mg by mouth every morning. 06/17/17   [provider]    Family History History reviewed. No pertinent family history.  Social History Social History   Tobacco Use  . Smoking status: Current Every Day Smoker    Packs/day: 0.50    Years: 7.00    Pack years: 3.50    Types: Cigarettes  .  Smokeless tobacco: Never Used  Substance Use Topics  . Alcohol use: No  . Drug use: No     Allergies   Patient has no known allergies.   Review of Systems Review of Systems  Constitutional: Negative for fever.  HENT: Negative for sore throat.   Eyes: Negative for redness.  Respiratory: Negative for shortness of breath.   Cardiovascular: Negative for chest pain.  Gastrointestinal: Negative for abdominal pain.  Endocrine: Negative for polyuria.  Genitourinary: Positive for flank pain. Negative for dysuria, hematuria, scrotal swelling and testicular pain.  Musculoskeletal: Negative for neck pain.  Skin: Negative for rash.  Neurological: Negative for headaches.  Hematological: Does not bruise/bleed easily.  Psychiatric/Behavioral: Negative for confusion.     Physical Exam Updated Vital Signs BP 127/79 (BP Location: Right Arm)   Pulse (!) 103   Temp 98.4 F (36.9 C) (Oral)   Resp (!) 28   Ht 1.727 m (5\' 8" )   Wt 72.6 kg   SpO2 97%   BMI 24.34 kg/m   Physical Exam  Constitutional: He appears well-developed and well-nourished.  HENT:  Head: Atraumatic.  Mouth/Throat: Oropharynx is clear and moist.  Eyes: Conjunctivae are normal.  Neck: Neck supple. No tracheal deviation present.  Cardiovascular: Normal rate, regular rhythm, normal heart sounds and intact distal pulses.  No murmur heard. Pulmonary/Chest: Effort normal and breath sounds normal. No accessory muscle usage. No respiratory distress.  Abdominal: Soft. Bowel sounds are normal. He exhibits no distension. There is no tenderness.  Genitourinary:  Genitourinary Comments: No cva tenderness. Normal external gu exam.   Musculoskeletal: He exhibits no edema.  Neurological: He is alert.  Skin: Skin is warm and dry. No rash noted.  Psychiatric:  Odd affect, very histrionic appearing, very anxious.   Nursing note and vitals reviewed.    ED Treatments / Results  Labs (all labs ordered are listed, but only  abnormal results are displayed) Results for orders placed or performed during the hospital encounter of 12/25/17  Rapid urine drug screen (hospital performed)  Result Value Ref Range   Opiates NONE DETECTED NONE DETECTED   Cocaine NONE DETECTED NONE DETECTED   Benzodiazepines POSITIVE (A) NONE DETECTED   Amphetamines POSITIVE (A) NONE DETECTED   Tetrahydrocannabinol POSITIVE (A) NONE DETECTED   Barbiturates NONE DETECTED NONE DETECTED  Urinalysis, Routine w reflex microscopic  Result Value Ref Range   Color, Urine AMBER (A) YELLOW   APPearance HAZY (A) CLEAR   Specific Gravity, Urine 1.027 1.005 - 1.030   pH 6.0 5.0 - 8.0   Glucose, UA NEGATIVE NEGATIVE mg/dL   Hgb urine dipstick MODERATE (A) NEGATIVE   Bilirubin Urine SMALL (A) NEGATIVE   Ketones, ur NEGATIVE NEGATIVE mg/dL  Protein, ur 30 (A) NEGATIVE mg/dL   Nitrite NEGATIVE NEGATIVE   Leukocytes, UA NEGATIVE NEGATIVE   RBC / HPF 21-50 0 - 5 RBC/hpf   WBC, UA 6-10 0 - 5 WBC/hpf   Bacteria, UA NONE SEEN NONE SEEN   Squamous Epithelial / LPF 0-5 0 - 5   Mucus PRESENT    Ct Renal Stone Study  Result Date: 12/25/2017 CLINICAL DATA:  Left flank pain.  History of kidney stones. EXAM: CT ABDOMEN AND PELVIS WITHOUT CONTRAST TECHNIQUE: Multidetector CT imaging of the abdomen and pelvis was performed following the standard protocol without IV contrast. COMPARISON:  Most recent CT 01/11/2017 FINDINGS: Lower chest: No pleural fluid or consolidation. Hepatobiliary: Decreased hepatic density consistent with steatosis. No focal lesion. No calcified gallstone, pericholecystic inflammation, or biliary dilatation. Pancreas: No ductal dilatation or inflammation. Spleen: Normal in size without focal abnormality. Adrenals/Urinary Tract: Normal adrenal glands. No hydronephrosis. Bilateral nonobstructing renal stones. No ureteral calculi. Both ureters are decompressed without stones along the course. No perinephric edema. Urinary bladder is partially  distended without bladder stone. No urethral stone visualized. Stomach/Bowel: Stomach is within normal limits. Appendix appears normal. No evidence of bowel wall thickening, distention, or inflammatory changes. Vascular/Lymphatic: Normal caliber abdominal aorta and abdominal pelvic vessels. No perivascular stranding. No abdominopelvic adenopathy. Reproductive: Prostate is unremarkable. Other: No free air, free fluid, or intra-abdominal fluid collection. Musculoskeletal: There are no acute or suspicious osseous abnormalities. IMPRESSION: 1. Bilateral nonobstructing renal calculi. No hydronephrosis or obstructive uropathy. 2. Incidental hepatic steatosis. Electronically Signed   By: Narda RutherfordMelanie  Sanford M.D.   On: 12/25/2017 23:15    EKG None  Radiology Ct Renal Stone Study  Result Date: 12/25/2017 CLINICAL DATA:  Left flank pain.  History of kidney stones. EXAM: CT ABDOMEN AND PELVIS WITHOUT CONTRAST TECHNIQUE: Multidetector CT imaging of the abdomen and pelvis was performed following the standard protocol without IV contrast. COMPARISON:  Most recent CT 01/11/2017 FINDINGS: Lower chest: No pleural fluid or consolidation. Hepatobiliary: Decreased hepatic density consistent with steatosis. No focal lesion. No calcified gallstone, pericholecystic inflammation, or biliary dilatation. Pancreas: No ductal dilatation or inflammation. Spleen: Normal in size without focal abnormality. Adrenals/Urinary Tract: Normal adrenal glands. No hydronephrosis. Bilateral nonobstructing renal stones. No ureteral calculi. Both ureters are decompressed without stones along the course. No perinephric edema. Urinary bladder is partially distended without bladder stone. No urethral stone visualized. Stomach/Bowel: Stomach is within normal limits. Appendix appears normal. No evidence of bowel wall thickening, distention, or inflammatory changes. Vascular/Lymphatic: Normal caliber abdominal aorta and abdominal pelvic vessels. No  perivascular stranding. No abdominopelvic adenopathy. Reproductive: Prostate is unremarkable. Other: No free air, free fluid, or intra-abdominal fluid collection. Musculoskeletal: There are no acute or suspicious osseous abnormalities. IMPRESSION: 1. Bilateral nonobstructing renal calculi. No hydronephrosis or obstructive uropathy. 2. Incidental hepatic steatosis. Electronically Signed   By: Narda RutherfordMelanie  Sanford M.D.   On: 12/25/2017 23:15    Procedures Procedures (including critical care time)  Medications Ordered in ED Medications  LORazepam (ATIVAN) injection 1 mg (has no administration in time range)     Initial Impression / Assessment and Plan / ED Course  I have reviewed the triage vital signs and the nursing notes.  Pertinent labs & imaging results that were available during my care of the patient were reviewed by me and considered in my medical decision making (see chart for details).  Iv ns. Labs sent. Ct imaging.  Reviewed nursing notes and prior charts for additional history.   Ativan 1 mg  iv for anxiety.   Toradol iv for pain. Dilaudid 1 mg iv.   ua reviewed - few rbc. Note: no fevers, no dysuria, no cva or abd tenderness.   Pt denies recently changing or running out of   With additional review charts, and [place] controlled substance database - multiple rxs for bzd, amphetamine, and opiates in past, no recent opiate rx.  pt is noted w hx renal stones, but past many, many visits with acute/severe flank pain, ua's during those visits often w some blood, however CTs negative for ureteral/obstructive stone.  On review database - multiple controlled substance rxs for pain, anxiety, as well as multiple rxs for mets containing amphetamine.  All of above is very concerning for drug seeking/drug using  behavior. Will provide resource guide. Given above concerns pt encouraged to use nsaids, and will not give controlled substance rx today.     Final Clinical Impressions(s) / ED Diagnoses    Final diagnoses:  None    ED Discharge Orders    None         Cathren Laine, MD 12/25/17 2357

## 2017-12-25 NOTE — ED Notes (Signed)
Dr.Stinel at bedside to discuss test results. Family at beside and pt still exhibiting pain after administration of Toradol 30mg  IV.

## 2017-12-25 NOTE — ED Triage Notes (Signed)
[facility] left flank pain with extensive prior hx of kidney stones. Pt reports pain worse on left flank area. EMS administered 250mcg Fentanyl during transport with minimal relief. Pt diaphoretic at time of triage and holding left flank area.

## 2017-12-25 NOTE — ED Notes (Signed)
Bed: WA01 Expected date:  Expected time:  Means of arrival:  Comments: [age] M Kidney Stones

## 2017-12-26 NOTE — Discharge Instructions (Signed)
It was our pleasure to provide your ER care today - we hope that you feel better.  Rest. Drink adequate fluids.   Take acetaminophen and ibuprofen as need for pain.   Try heat therapy and massage therapy to sore/painful area.   Follow up with primary care doctor Monday for recheck. Also have your blood pressure rechecked then as it is high today.   Return to ER if worse, new symptoms, fevers, other concern.   You were given pain medication in the ER - no driving for the next 8 hours.

## 2018-05-16 ENCOUNTER — Other Ambulatory Visit: Payer: Self-pay

## 2018-05-16 ENCOUNTER — Encounter (HOSPITAL_COMMUNITY): Payer: Self-pay

## 2018-05-16 ENCOUNTER — Emergency Department (HOSPITAL_COMMUNITY)
Admission: EM | Admit: 2018-05-16 | Discharge: 2018-05-16 | Disposition: A | Discharge status: Home / Self Care | Payer: 59 | Attending: Emergency Medicine | Admitting: Emergency Medicine

## 2018-05-16 ENCOUNTER — Emergency Department (HOSPITAL_COMMUNITY): Payer: 59

## 2018-05-16 DIAGNOSIS — R1032 Left lower quadrant pain: Secondary | ICD-10-CM | POA: Insufficient documentation

## 2018-05-16 DIAGNOSIS — R319 Hematuria, unspecified: Secondary | ICD-10-CM | POA: Insufficient documentation

## 2018-05-16 DIAGNOSIS — R109 Unspecified abdominal pain: Secondary | ICD-10-CM

## 2018-05-16 DIAGNOSIS — Z79899 Other long term (current) drug therapy: Secondary | ICD-10-CM | POA: Insufficient documentation

## 2018-05-16 DIAGNOSIS — F17210 Nicotine dependence, cigarettes, uncomplicated: Secondary | ICD-10-CM | POA: Diagnosis not present

## 2018-05-16 LAB — URINALYSIS, ROUTINE W REFLEX MICROSCOPIC
BILIRUBIN URINE: NEGATIVE
Glucose, UA: NEGATIVE mg/dL
Ketones, ur: 5 mg/dL — AB
Nitrite: NEGATIVE
PROTEIN: 100 mg/dL — AB
Specific Gravity, Urine: 1.034 — ABNORMAL HIGH (ref 1.005–1.030)
pH: 5 (ref 5.0–8.0)

## 2018-05-16 MED ORDER — KETOROLAC TROMETHAMINE 30 MG/ML IJ SOLN
15.0000 mg | Freq: Once | INTRAMUSCULAR | Status: AC
  Administered 2018-05-16: 15 mg via INTRAVENOUS
  Filled 2018-05-16: qty 1

## 2018-05-16 MED ORDER — HALOPERIDOL LACTATE 5 MG/ML IJ SOLN
2.0000 mg | Freq: Once | INTRAMUSCULAR | Status: AC
  Administered 2018-05-16: 2 mg via INTRAVENOUS
  Filled 2018-05-16: qty 1

## 2018-05-16 MED ORDER — SODIUM CHLORIDE 0.9 % IV SOLN
1.5000 mg/kg | Freq: Once | INTRAVENOUS | Status: AC
  Administered 2018-05-16: 122 mg via INTRAVENOUS
  Filled 2018-05-16: qty 6.1

## 2018-05-16 MED ORDER — SODIUM CHLORIDE 0.9 % IV BOLUS
1000.0000 mL | Freq: Once | INTRAVENOUS | Status: AC
  Administered 2018-05-16: 1000 mL via INTRAVENOUS

## 2018-05-16 NOTE — ED Provider Notes (Addendum)
[facility]-EMERGENCY DEPT Provider Note   CSN: [id number] Arrival date & time: 05/16/18  1212     History   Chief Complaint Chief Complaint  Patient presents with  . Flank Pain    HPI Gregory Flores is a 38 y.o. male.  HPI Patient with a history of recurrent kidney stones presents with left flank pain. He notes that he does have some degree of flank pain, but now over the past day or so he has developed more severe persistent pain in the left flank, with radiation inferiorly. No relief with ibuprofen. No overt dysuria, no overt hematuria. No fever. Patient was unable to sleep last night.  Patient is here with his father who assists with the HPI per Patient has a urologist in [facility], no local specialist.  Past Medical History:  Diagnosis Date  . Arthritis   . Bulging lumbar disc   . Chronic lumbar pain   . Depression   . Drug-seeking behavior   . Kidney stone   . Kidney stones   . Night terrors, adult   . Renal disorder     Patient Active Problem List   Diagnosis Date Noted  . Other convulsions 01/04/2013  . NEPHROLITHIASIS, HX OF 04/15/2010  . ABDOMINAL PAIN, LEFT LOWER QUADRANT 04/01/2010    Past Surgical History:  Procedure Laterality Date  . BLADDER STONE REMOVAL    . LITHOTRIPSY          Home Medications    Prior to Admission medications   Medication Sig Start Date End Date Taking? Authorizing Provider  AIMOVIG 140 MG/ML SOAJ Inject 140 mg into the muscle every 30 (thirty) days.  03/21/18  Yes [provider]  amphetamine-dextroamphetamine (ADDERALL) 20 MG tablet Take 20 mg by mouth 2 (two) times daily.  12/03/16  Yes [provider]  clonazePAM (KLONOPIN) 1 MG tablet Take 1 mg by mouth 2 (two) times daily as needed for anxiety.  08/29/17  Yes [provider]  gabapentin (NEURONTIN) 600 MG tablet Take 600 mg by mouth 3 (three) times daily. 07/27/17  Yes [provider]  ibuprofen (ADVIL,MOTRIN)  200 MG tablet Take 800 mg by mouth every 6 (six) hours as needed for moderate pain.   Yes [provider]  lamoTRIgine (LAMICTAL) 100 MG tablet Take 100 mg by mouth every morning. 07/06/17  Yes [provider]  ondansetron (ZOFRAN) 4 MG tablet Take 4 mg by mouth every 8 (eight) hours as needed. 05/02/18  Yes [provider]  prazosin (MINIPRESS) 2 MG capsule Take 2 mg by mouth every evening. 11/18/17  Yes [provider]  promethazine (PHENERGAN) 25 MG tablet Take 25 mg by mouth every 6 (six) hours as needed. 05/06/18  Yes [provider]  QUEtiapine (SEROQUEL) 100 MG tablet Take 100 mg by mouth at bedtime. 03/28/18  Yes [provider]  SUMAtriptan (IMITREX) 100 MG tablet Take 100 mg by mouth every 2 (two) hours as needed for migraine.  08/13/17  Yes [provider]  VENTOLIN HFA 108 (90 Base) MCG/ACT inhaler Inhale 1-2 puffs into the lungs every 6 (six) hours as needed for wheezing or shortness of breath.  06/03/17  Yes [provider]  VIIBRYD 40 MG TABS Take 40 mg by mouth every morning. 06/17/17  Yes [provider]  amoxicillin (AMOXIL) 875 MG tablet Take 875 mg by mouth 2 (two) times daily. 12/20/17   [provider]  HYDROcodone-acetaminophen (NORCO/VICODIN) 5-325 MG tablet Take 2 tablets by  mouth every 4 (four) hours as needed. Patient not taking: Reported on 12/26/2017 08/30/17   Demetrios Loll T, PA-C  predniSONE (DELTASONE) 20 MG tablet Take 40 mg by mouth daily. 12/20/17   [provider]  tamsulosin (FLOMAX) 0.4 MG CAPS capsule Take 1 capsule (0.4 mg total) by mouth daily. Patient not taking: Reported on 05/16/2018 08/11/16   Cy Blamer, MD    Family History History reviewed. No pertinent family history.  Social History Social History   Tobacco Use  . Smoking status: Current Every Day Smoker    Packs/day: 0.50    Years: 7.00    Pack years: 3.50    Types: Cigarettes  . Smokeless tobacco:  Never Used  Substance Use Topics  . Alcohol use: No  . Drug use: No     Allergies   Patient has no known allergies.   Review of Systems Review of Systems  Constitutional:       Per HPI, otherwise negative  HENT:       Per HPI, otherwise negative  Respiratory:       Per HPI, otherwise negative  Cardiovascular:       Per HPI, otherwise negative  Gastrointestinal: Positive for nausea. Negative for vomiting.  Endocrine:       Negative aside from HPI  Genitourinary:       Neg aside from HPI   Musculoskeletal:       Per HPI, otherwise negative  Skin: Negative.   Neurological: Negative for syncope.     Physical Exam Updated Vital Signs BP (!) 146/98   Pulse 100   Temp 97.8 F (36.6 C) (Oral)   Resp 15   Ht 5\' 7"  (1.702 m)   Wt 81.6 kg   SpO2 98%   BMI 28.19 kg/m   Physical Exam Vitals signs and nursing note reviewed.  Constitutional:      General: He is not in acute distress.    Appearance: He is well-developed.     Comments: Uncomfortable appearing thin young male awake and alert  HENT:     Head: Normocephalic and atraumatic.  Eyes:     Conjunctiva/sclera: Conjunctivae normal.  Cardiovascular:     Rate and Rhythm: Normal rate and regular rhythm.  Pulmonary:     Effort: Pulmonary effort is normal. No respiratory distress.     Breath sounds: No stridor.  Abdominal:     General: There is no distension.     Comments: CVA tenderness, left lateral abdominal tenderness, no peritoneal findings  Skin:    General: Skin is warm and dry.  Neurological:     Mental Status: He is alert and oriented to person, place, and time.      ED Treatments / Results  Labs (all labs ordered are listed, but only abnormal results are displayed) Labs Reviewed  URINALYSIS, ROUTINE W REFLEX MICROSCOPIC - Abnormal; Notable for the following components:      Result Value   APPearance HAZY (*)    Specific Gravity, Urine 1.034 (*)    Hgb urine dipstick SMALL (*)    Ketones, ur 5  (*)    Protein, ur 100 (*)    Leukocytes, UA SMALL (*)    Bacteria, UA RARE (*)    All other components within normal limits    EKG None  Radiology No results found.  Procedures Procedures (including critical care time)  Medications Ordered in ED Medications  sodium chloride 0.9 % bolus 1,000 mL (has no administration in time range)  ketorolac (TORADOL) 30 MG/ML injection 15 mg (has no administration in time range)  lidocaine (XYLOCAINE) 122 mg in sodium chloride 0.9 % 100 mL IVPB (has no administration in time range)     Initial Impression / Assessment and Plan / ED Course  I have reviewed the triage vital signs and the nursing notes.  Pertinent labs & imaging results that were available during my care of the patient were reviewed by me and considered in my medical decision making (see chart for details).    Chart review notable for multiple recent CT scan for kidney stone.  Young male with a history of recurrent kidney stones presents with flank pain consistent with multiple prior events. Patient is very uncomfortable on initial exam, received lidocaine, Toradol, fluids, is awaiting CT scan.  Dr. Rubin PayorPickering will follow the patient's course. Final Clinical Impressions(s) / ED Diagnoses  Flank pain Hematuria   Gerhard MunchLockwood, Ronie Fleeger, MD 05/16/18 [phone]

## 2018-05-16 NOTE — ED Triage Notes (Signed)
Patient c/o intermittent bilateral flank pain since yesterday. patient reports a history of kidney stones. Patient states he has issues getting his "urine started."

## 2018-05-16 NOTE — ED Provider Notes (Signed)
  Physical Exam  BP (!) 145/90   Pulse 70   Temp 97.8 F (36.6 C) (Oral)   Resp 15   Ht 5\' 7"  (1.702 m)   Wt 81.6 kg   SpO2 95%   BMI 28.19 kg/m   Physical Exam  ED Course/Procedures     Procedures  MDM  Received patient in signout.  Acute on chronic flank pain.  Scan reassuring.  Urine does not show infection.  Pain somewhat improved after treatment.  Will discharge home       Benjiman Core, MD 05/16/18 2012

## 2018-07-20 IMAGING — CR DG FOOT COMPLETE 3+V*L*
3 series · 3 of 3 positions shown · non-contrast
Comparison: None.

CLINICAL DATA: Pain over the left first MTP joint after kicking a
bench on [REDACTED].

EXAM:
LEFT FOOT - COMPLETE 3+ VIEW

[t foot ap left]
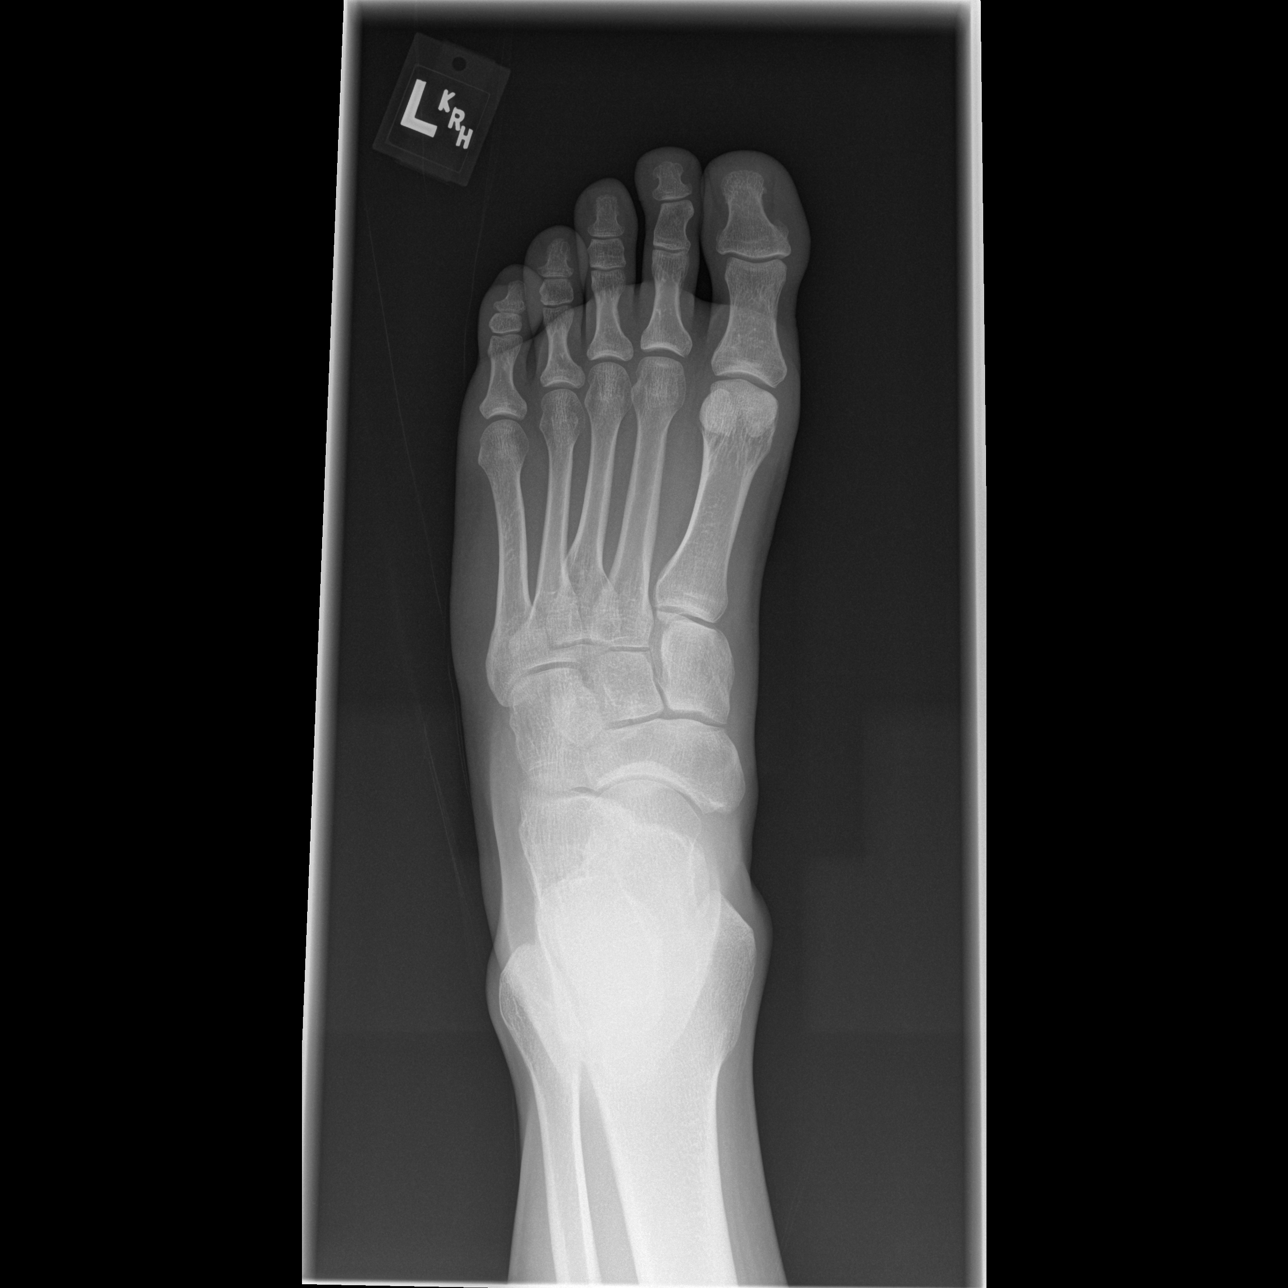

[t foot oblique left]
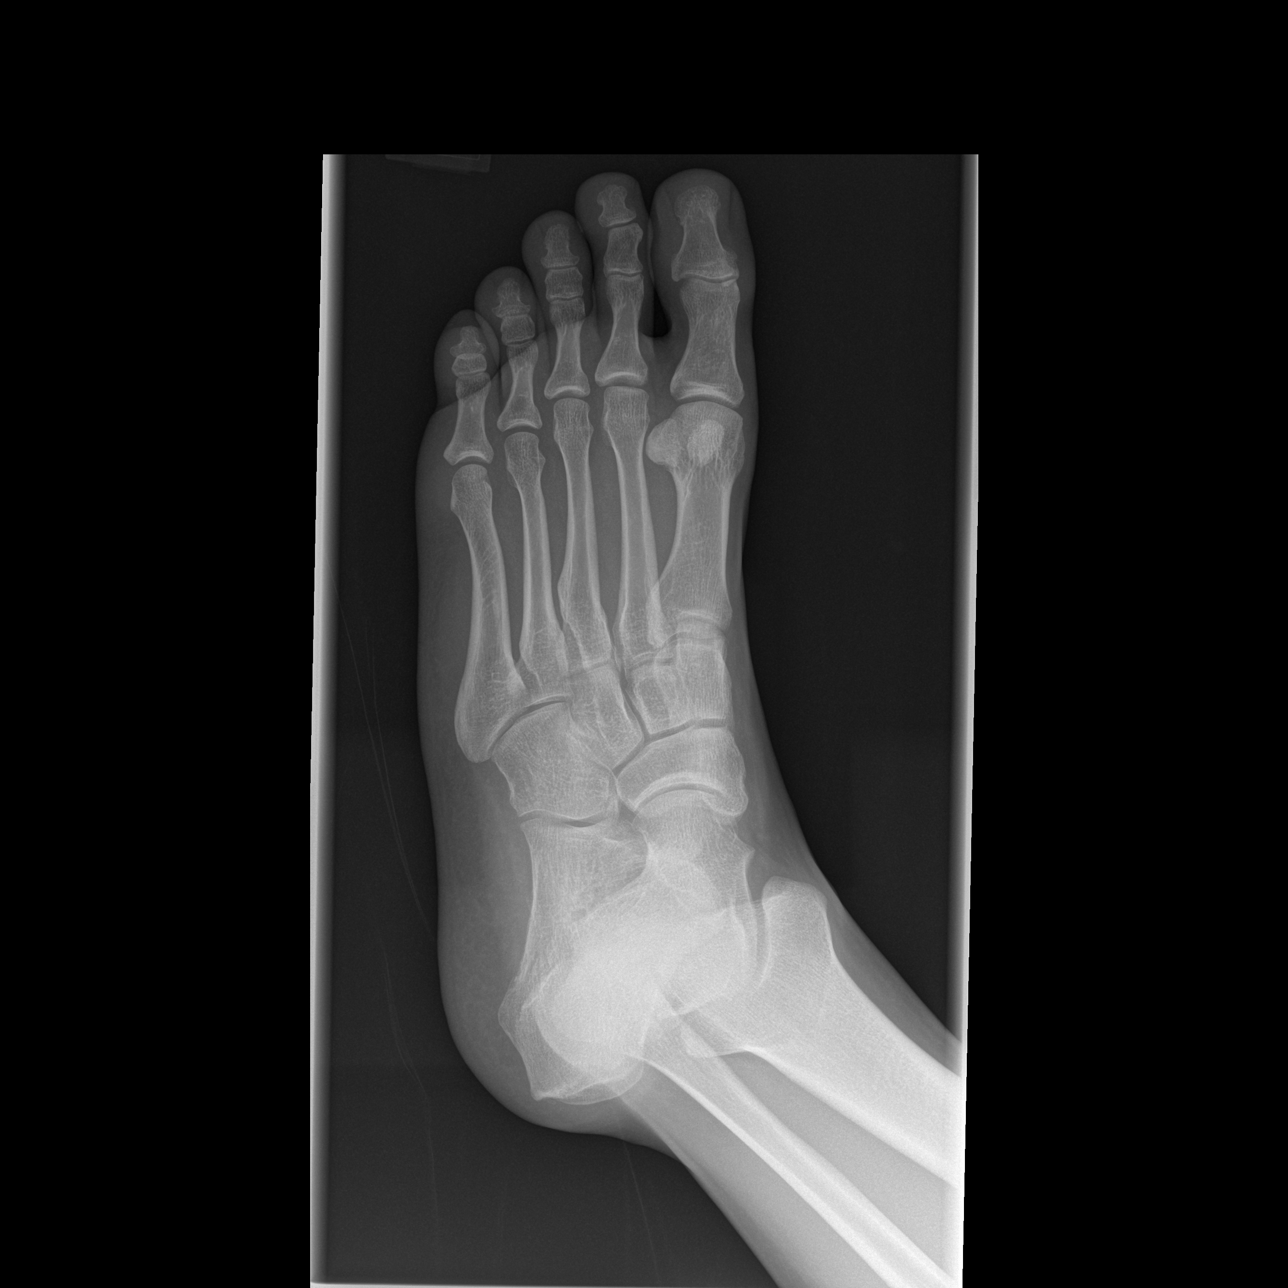

[t foot lat left]
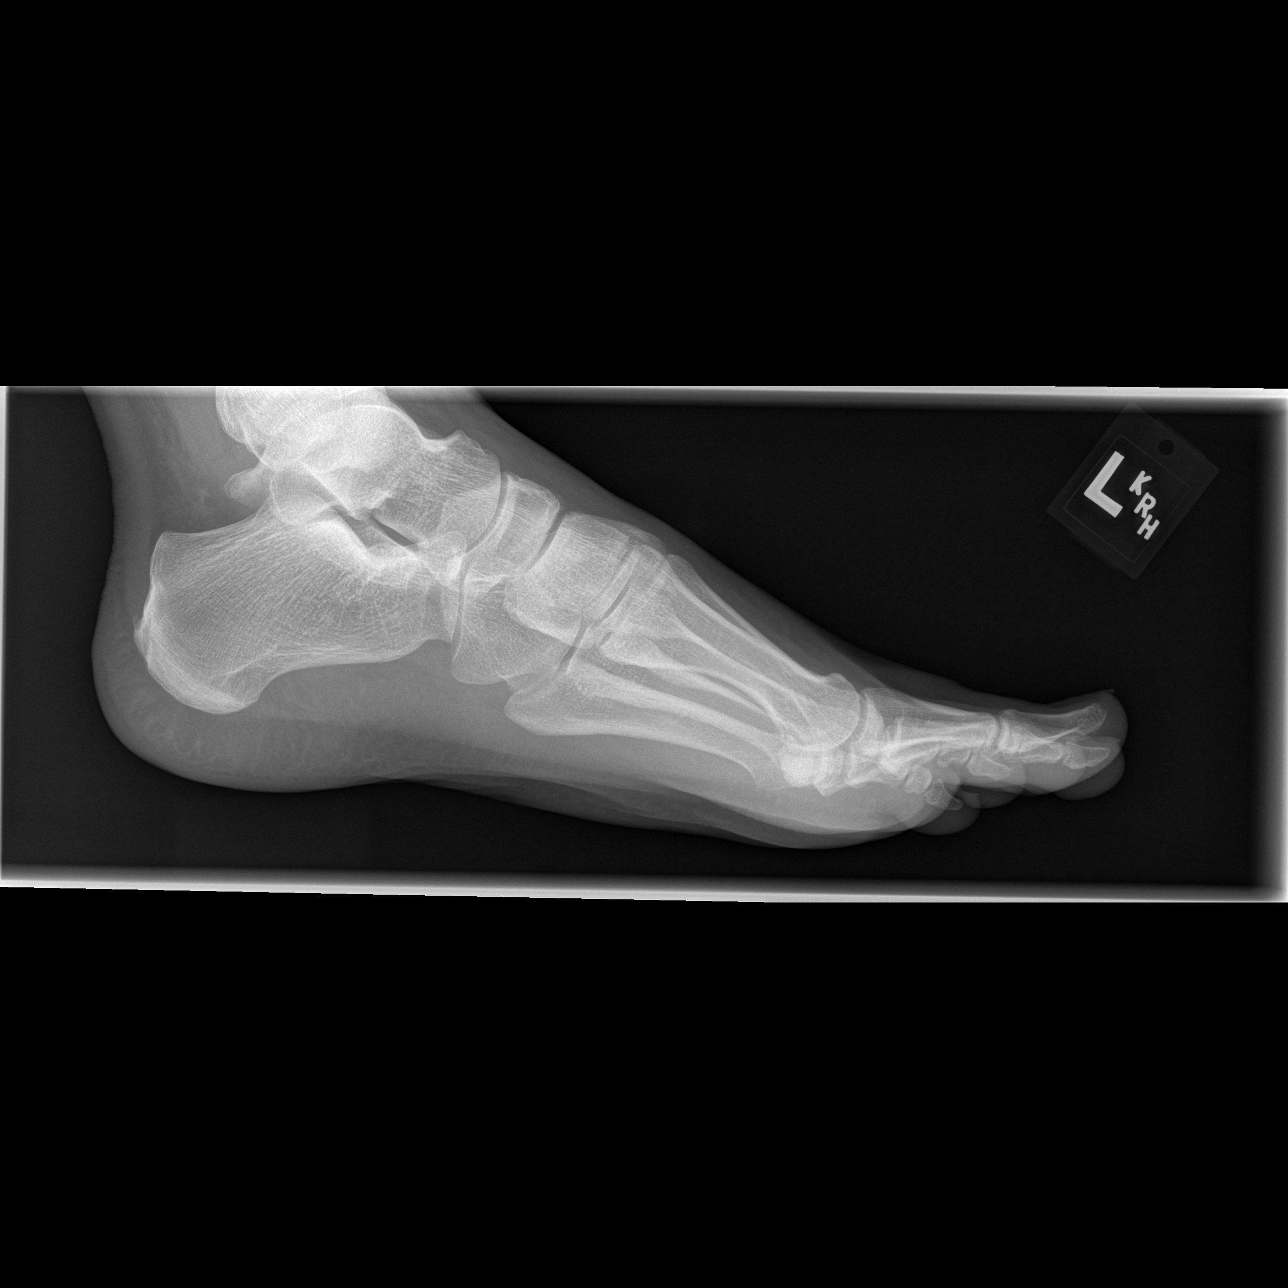

[3 of 3 positions shown; findings below may reference images not displayed]

FINDINGS: No fracture or dislocation with special attention paid to the first
digit. No hallux valgus deformity. Joint spaces are preserved. No
erosions. Regional soft tissues appear normal. No radiopaque foreign
body.
IMPRESSION: No acute findings with special attention paid to the great toe.

## 2019-01-20 ENCOUNTER — Telehealth: Payer: Self-pay

## 2019-01-20 NOTE — Telephone Encounter (Signed)
° ° °  Patient's father called for directions on obtaining monitor. Please call.

## 2019-01-23 ENCOUNTER — Other Ambulatory Visit: Payer: Self-pay | Admitting: *Deleted

## 2019-01-23 DIAGNOSIS — R55 Syncope and collapse: Secondary | ICD-10-CM

## 2019-01-23 NOTE — Telephone Encounter (Signed)
3 day ZIO XT long term holter monitor to be mailed to the patients home.  Instructions reviewed briefly as they are included in the monitor kit. 

## 2019-01-31 ENCOUNTER — Ambulatory Visit (INDEPENDENT_AMBULATORY_CARE_PROVIDER_SITE_OTHER): Payer: 59

## 2019-01-31 DIAGNOSIS — R55 Syncope and collapse: Secondary | ICD-10-CM | POA: Diagnosis not present

## 2019-04-03 ENCOUNTER — Other Ambulatory Visit: Payer: Self-pay

## 2019-04-03 DIAGNOSIS — M7989 Other specified soft tissue disorders: Secondary | ICD-10-CM

## 2019-04-04 ENCOUNTER — Encounter: Payer: Self-pay | Admitting: Vascular Surgery

## 2019-04-04 ENCOUNTER — Ambulatory Visit (HOSPITAL_COMMUNITY)
Admission: RE | Admit: 2019-04-04 | Discharge: 2019-04-04 | Disposition: A | Discharge status: Home / Self Care | Payer: 59 | Source: Ambulatory Visit | Attending: Vascular Surgery | Admitting: Vascular Surgery

## 2019-04-04 ENCOUNTER — Other Ambulatory Visit: Payer: Self-pay

## 2019-04-04 ENCOUNTER — Ambulatory Visit (INDEPENDENT_AMBULATORY_CARE_PROVIDER_SITE_OTHER): Payer: 59 | Admitting: Vascular Surgery

## 2019-04-04 VITALS — BP 143/103 | HR 94 | Temp 98.6°F | Resp 20 | Ht 67.0 in | Wt 230.9 lb

## 2019-04-04 DIAGNOSIS — I890 Lymphedema, not elsewhere classified: Secondary | ICD-10-CM | POA: Diagnosis not present

## 2019-04-04 DIAGNOSIS — M7989 Other specified soft tissue disorders: Secondary | ICD-10-CM | POA: Insufficient documentation

## 2019-04-04 NOTE — Progress Notes (Signed)
V[facility] and Vein Specialist of [place]  Patient name: Gregory Flores MRN: [id number] DOB: 1980/05/28 Sex: male  REASON FOR CONSULT: Evaluation of left greater than right leg swelling  HPI: Gregory Flores is a 38 y.o. male, who is here today for discussion of lower extremity swelling.  This is been progressive over the last several months.  He notices on both legs but is markedly worse in the left than the right leg.  Swelling is difficult to control and is causing skin changes as well.  He has minimal relief with elevation and has worn compression stockings which are causing him some discomfort as well.  Past Medical History:  Diagnosis Date  . Arthritis   . Bulging lumbar disc   . Chronic lumbar pain   . Depression   . Drug-seeking behavior   . Kidney stone   . Kidney stones   . Night terrors, adult   . Renal disorder     History reviewed. No pertinent family history.  SOCIAL HISTORY: Social History   Socioeconomic History  . Marital status: Married    Spouse name: [place]  . Number of children: 1  . Years of education: GED  . Highest education level: Not on file  Occupational History  . Occupation: Engineer, maintenance (IT): [employer]  Tobacco Use  . Smoking status: Current Every Day Smoker    Packs/day: 0.50    Years: 7.00    Pack years: 3.50    Types: Cigarettes  . Smokeless tobacco: Never Used  Substance and Sexual Activity  . Alcohol use: No  . Drug use: No  . Sexual activity: Not on file  Other Topics Concern  . Not on file  Social History Narrative   Patient lives at home with family.   Caffeine Use: 2 sodas weekly   Social Determinants of Health   Financial Resource Strain:   . Difficulty of Paying Living Expenses: Not on file  Food Insecurity:   . Worried About Programme researcher, broadcasting/film/video in the Last Year: Not on file  . Ran Out of Food in the Last Year: Not on file  Transportation Needs:   . Lack of Transportation  (Medical): Not on file  . Lack of Transportation (Non-Medical): Not on file  Physical Activity:   . Days of Exercise per Week: Not on file  . Minutes of Exercise per Session: Not on file  Stress:   . Feeling of Stress : Not on file  Social Connections:   . Frequency of Communication with Friends and Family: Not on file  . Frequency of Social Gatherings with Friends and Family: Not on file  . Attends Religious Services: Not on file  . Active Member of Clubs or Organizations: Not on file  . Attends Banker Meetings: Not on file  . Marital Status: Not on file  Intimate Partner Violence:   . Fear of Current or Ex-Partner: Not on file  . Emotionally Abused: Not on file  . Physically Abused: Not on file  . Sexually Abused: Not on file    No Known Allergies  Current Outpatient Medications  Medication Sig Dispense Refill  . Buprenorphine HCl-Naloxone HCl 8-2 MG FILM DISSOLVE 3/4 OF A FILM UNDER THE TONGUE BID FOR P    . buPROPion (WELLBUTRIN SR) 100 MG 12 hr tablet TK 1 T PO  BID    . Fremanezumab-vfrm (AJOVY) 225 MG/1.5ML SOAJ Inject into the skin.    Marland Kitchen gabapentin (NEURONTIN) 600 MG  tablet Take 600 mg by mouth 3 (three) times daily.  2  . gemfibrozil (LOPID) 600 MG tablet Take 600 mg by mouth 2 (two) times daily.    Marland Kitchen. ibuprofen (ADVIL,MOTRIN) 200 MG tablet Take 800 mg by mouth every 6 (six) hours as needed for moderate pain.    Marland Kitchen. lamoTRIgine (LAMICTAL) 100 MG tablet Take 100 mg by mouth every morning.  2  . SUMAtriptan (IMITREX) 100 MG tablet Take 100 mg by mouth every 2 (two) hours as needed for migraine.   1   No current facility-administered medications for this visit.    REVIEW OF SYSTEMS:  [X]  denotes positive finding, [ ]  denotes negative finding Cardiac  Comments:  Chest pain or chest pressure:    Shortness of breath upon exertion:    Short of breath when lying flat:    Irregular heart rhythm:        Vascular    Pain in calf, thigh, or hip brought on by  ambulation:    Pain in feet at night that wakes you up from your sleep:     Blood clot in your veins:    Leg swelling:  x       Pulmonary    Oxygen at home:    Productive cough:     Wheezing:         Neurologic    Sudden weakness in arms or legs:     Sudden numbness in arms or legs:     Sudden onset of difficulty speaking or slurred speech:    Temporary loss of vision in one eye:     Problems with dizziness:         Gastrointestinal    Blood in stool:     Vomited blood:         Genitourinary    Burning when urinating:     Blood in urine:        Psychiatric    Major depression:         Hematologic    Bleeding problems:    Problems with blood clotting too easily:        Skin    Rashes or ulcers:        Constitutional    Fever or chills:      PHYSICAL EXAM: Vitals:   04/04/19 1330  BP: (!) 143/103  Pulse: 94  Resp: 20  Temp: 98.6 F (37 C)  SpO2: 96%  Weight: 230 lb 14.4 oz (104.7 kg)  Height: 5\' 7"  (1.702 m)    GENERAL: The patient is a well-nourished male, in no acute distress. The vital signs are documented above. CARDIOVASCULAR: 2+ dorsalis pedis pulses bilaterally.  Does have changes consistent with lymphedema bilaterally.  More so on the left lower extremity.  He does have skin changes of thickening over the medial aspect and also extending down onto his ankle.  The swelling extends onto his foot as well PULMONARY: There is good air exchange  ABDOMEN: Soft and non-tender  MUSCULOSKELETAL: There are no major deformities or cyanosis. NEUROLOGIC: No focal weakness or paresthesias are detected. SKIN: There are no ulcers or rashes noted. PSYCHIATRIC: The patient has a normal affect.  DATA:  Left leg venous study reveals no evidence of venous obstruction and no evidence of saphenous vein incompetence.  There is incompetence in the common femoral vein only  MEDICAL ISSUES: Had long discussion with the patient.  His findings certainly are consistent with  severe lymphedema in his left leg  and becoming more progressive in his right leg.  I explained the difficulty in managing this with the main goal of preventing progression.  He has worn compression garments with minimal relief.  At his young age of 89 I feel that he would be a good excellent candidate for sequential pump for treatment.  We will help coordinate this for him  I do feel that he has progressive lymphedema and that it is at least stage II. He has hyperkeratosis, hyperpigmentation.  No weeping yet.  Has failed compression garments and elevation.  Recommend pneumatic compression pump   Rosetta Posner, MD [facility] Special[facility] of [facility] Tel [phone] Pager [phone]

## 2019-04-05 ENCOUNTER — Telehealth: Payer: Self-pay | Admitting: Vascular Surgery

## 2019-04-05 NOTE — Telephone Encounter (Signed)
Order for a Lymphedema pump was faxed to Jerrell Mylar at [facility] [employer] [facility] today, along with demographics and insurance information.  Order was added to get the patients right and left leg measurements if needed per insurance guidelines.  I left a message for the patient informing him to look out for a call from [employer].  Thurston Hole., LPN

## 2019-06-07 ENCOUNTER — Other Ambulatory Visit (HOSPITAL_BASED_OUTPATIENT_CLINIC_OR_DEPARTMENT_OTHER): Payer: Self-pay

## 2019-06-07 DIAGNOSIS — G4733 Obstructive sleep apnea (adult) (pediatric): Secondary | ICD-10-CM

## 2019-06-07 DIAGNOSIS — G4731 Primary central sleep apnea: Secondary | ICD-10-CM

## 2019-06-20 ENCOUNTER — Other Ambulatory Visit (HOSPITAL_COMMUNITY)
Admission: RE | Admit: 2019-06-20 | Discharge: 2019-06-20 | Disposition: A | Discharge status: Home / Self Care | Payer: 59 | Source: Ambulatory Visit | Attending: Internal Medicine | Admitting: Internal Medicine

## 2019-06-20 DIAGNOSIS — Z20822 Contact with and (suspected) exposure to covid-19: Secondary | ICD-10-CM | POA: Diagnosis not present

## 2019-06-20 DIAGNOSIS — Z01812 Encounter for preprocedural laboratory examination: Secondary | ICD-10-CM | POA: Diagnosis present

## 2019-06-20 LAB — SARS CORONAVIRUS 2 (TAT 6-24 HRS): SARS Coronavirus 2: NEGATIVE

## 2019-06-23 ENCOUNTER — Other Ambulatory Visit: Payer: Self-pay

## 2019-06-23 ENCOUNTER — Ambulatory Visit (HOSPITAL_BASED_OUTPATIENT_CLINIC_OR_DEPARTMENT_OTHER): Payer: 59 | Attending: Internal Medicine | Admitting: Internal Medicine

## 2019-06-23 VITALS — Temp 97.6°F | Ht 67.0 in | Wt 225.0 lb

## 2019-06-23 DIAGNOSIS — Z79899 Other long term (current) drug therapy: Secondary | ICD-10-CM | POA: Insufficient documentation

## 2019-06-23 DIAGNOSIS — G4733 Obstructive sleep apnea (adult) (pediatric): Secondary | ICD-10-CM | POA: Diagnosis present

## 2019-06-23 DIAGNOSIS — G4731 Primary central sleep apnea: Secondary | ICD-10-CM | POA: Diagnosis not present

## 2019-06-26 ENCOUNTER — Other Ambulatory Visit (HOSPITAL_BASED_OUTPATIENT_CLINIC_OR_DEPARTMENT_OTHER): Payer: Self-pay

## 2019-06-26 DIAGNOSIS — G4733 Obstructive sleep apnea (adult) (pediatric): Secondary | ICD-10-CM

## 2019-06-26 DIAGNOSIS — G4731 Primary central sleep apnea: Secondary | ICD-10-CM

## 2019-07-04 NOTE — Procedures (Signed)
   NAME: Gregory Flores DATE OF BIRTH:  14-Jun-1980 MEDICAL RECORD NUMBER [id number]  LOCATION: [facility]  PHYSICIAN: Deretha Emory  DATE OF STUDY: 06/23/2019  SLEEP STUDY TYPE: Positive Airway Pressure Titration               REFERRING PHYSICIAN: Deretha Emory, MD  INDICATION FOR STUDY: Development of central sleep apnea in the setting of treatment of obstructive sleep apnea with BPAP  EPWORTH SLEEPINESS SCORE:  NA HEIGHT: 5\' 7"  (170.2 cm)  WEIGHT: 225 lb (102.1 kg)    Body mass index is 35.24 kg/m.  NECK SIZE: 16 in.  MEDICATIONS Patient self administered medications include: LOPID, NEURONTIN. Medications administered during study include No sleep medicine administered.  SLEEP STUDY TECHNIQUE The patient was referred for possible BPAP or ASV titration. However, a standard CPAP titration was done, and he underwent an attended overnight polysomnography titration to assess the effects of cpap therapy. The following variables were monitored: EEG(C4-A1, C3-A2, O1-A2, O2-A1), EOG, submental and leg EMG, ECG, oxyhemoglobin saturation by pulse oximetry, thoracic and abdominal respiratory effort belts, nasal/oral airflow by pressure sensor, body position sensor and snoring sensor. CPAP pressure was titrated to eliminate apneas, hypopneas and oxygen desaturation.  TECHNICAL COMMENTS Comments added by Technician: ONE RESTROOM VISTED Comments added by Scorer: N/A  SLEEP ARCHITECTURE The study was initiated at 9:40:41 PM and terminated at 4:25:49 AM. Total recorded time was 405.1 minutes. EEG confirmed total sleep time was 333 minutes yielding a sleep efficiency of 82.2%%. Sleep onset after lights out was 6.4 minutes with a REM latency of 74.5 minutes. The patient spent 6.3%% of the night in stage N1 sleep, 53.8%% in stage N2 sleep, 24.9%% in stage N3 and 15% in REM. The Arousal Index was 12.6/hour.  RESPIRATORY PARAMETERS The overall AHI was 10.5 per hour, and the  RDI was 10.8 events/hour with a central apnea index of 4.5per hour. THe patient was started on CPAP, and the pressure was increased to 13 cm water pressure. See titration chart. The most appropriate setting of CPAP was IPAP/EPAP 12/12 cm H2O. At this setting, the sleep efficiency was 99% and the patient was supine for 100%. The AHI was 8.8 events per hour, and the RDI was 9.7 events/hour (with approximately 50% of the events being central events) and the arousal index was 8.0 per hour.The oxygen nadir was 94.0% during sleep.  LEG MOVEMENT DATA The total leg movements were 0 with a resulting leg movement index of 0.0/hr. Associated arousal with leg movement index was 0.0/hr.  CARDIAC DATA The underlying cardiac rhythm was most consistent with sinus rhythm. Mean heart rate during sleep was 80.3 bpm. Additional rhythm abnormalities include None.  IMPRESSIONS - CPAP of 12 cm water resulted in acceptable AHI for obstructive and central events.  DIAGNOSIS - Obstructive Sleep Apnea  - Central Sleep Apnea  RECOMMENDATIONS - Consider trial of CPAP therapy on 12 cm H2O. Alternatively, the patient could return to the lab to see if BPAP or ASV will afford him better airway control  Marland Kitchen Sleep specialist, American Board of Internal Medicine  ELECTRONICALLY SIGNED ON:  07/04/2019, 7:54 PM [place] [facility] PH: [phone]   FX: [phone] ACCREDITED BY THE AMERICAN ACADEMY OF SLEEP MEDICINE

## 2019-07-11 ENCOUNTER — Other Ambulatory Visit (HOSPITAL_BASED_OUTPATIENT_CLINIC_OR_DEPARTMENT_OTHER): Payer: Self-pay

## 2019-07-11 DIAGNOSIS — G4731 Primary central sleep apnea: Secondary | ICD-10-CM

## 2019-07-11 DIAGNOSIS — G4733 Obstructive sleep apnea (adult) (pediatric): Secondary | ICD-10-CM

## 2019-07-14 ENCOUNTER — Ambulatory Visit: Payer: 59 | Attending: Internal Medicine

## 2019-07-14 DIAGNOSIS — Z23 Encounter for immunization: Secondary | ICD-10-CM

## 2019-07-14 NOTE — Progress Notes (Signed)
   Covid-19 Vaccination Clinic  Name:  Gregory Flores    MRN: [id number] DOB: 1980-12-28  07/14/2019  [name] was observed post Covid-19 immunization for 15 minutes without incident. He was provided with Vaccine Information Sheet and instruction to access the V-Safe system.   Mr. Reasons was instructed to call 911 with any severe reactions post vaccine: Marland Kitchen Difficulty breathing  . Swelling of face and throat  . A fast heartbeat  . A bad rash all over body  . Dizziness and weakness   Immunizations Administered    Name Date Dose VIS Date Route   Pfizer COVID-19 Vaccine 07/14/2019 10:47 AM 0.3 mL 03/24/2019 Intramuscular   Manufacturer: [employer], [employer]   Lot: [id number]   NDC: [address]-6

## 2019-07-29 ENCOUNTER — Other Ambulatory Visit (HOSPITAL_COMMUNITY)
Admission: RE | Admit: 2019-07-29 | Discharge: 2019-07-29 | Disposition: A | Discharge status: Home / Self Care | Payer: 59 | Source: Ambulatory Visit | Attending: Internal Medicine | Admitting: Internal Medicine

## 2019-07-29 DIAGNOSIS — Z01812 Encounter for preprocedural laboratory examination: Secondary | ICD-10-CM | POA: Insufficient documentation

## 2019-07-29 DIAGNOSIS — Z20822 Contact with and (suspected) exposure to covid-19: Secondary | ICD-10-CM | POA: Insufficient documentation

## 2019-07-29 LAB — SARS CORONAVIRUS 2 (TAT 6-24 HRS): SARS Coronavirus 2: NEGATIVE

## 2019-08-01 ENCOUNTER — Ambulatory Visit (HOSPITAL_BASED_OUTPATIENT_CLINIC_OR_DEPARTMENT_OTHER): Payer: 59 | Attending: Internal Medicine | Admitting: Internal Medicine

## 2019-08-01 ENCOUNTER — Other Ambulatory Visit: Payer: Self-pay

## 2019-08-01 VITALS — Ht 67.0 in | Wt 240.0 lb

## 2019-08-01 DIAGNOSIS — G4733 Obstructive sleep apnea (adult) (pediatric): Secondary | ICD-10-CM

## 2019-08-01 DIAGNOSIS — G4731 Primary central sleep apnea: Secondary | ICD-10-CM

## 2019-08-02 ENCOUNTER — Other Ambulatory Visit (HOSPITAL_BASED_OUTPATIENT_CLINIC_OR_DEPARTMENT_OTHER): Payer: Self-pay

## 2019-08-02 DIAGNOSIS — G4731 Primary central sleep apnea: Secondary | ICD-10-CM

## 2019-08-02 DIAGNOSIS — G4733 Obstructive sleep apnea (adult) (pediatric): Secondary | ICD-10-CM

## 2019-08-09 ENCOUNTER — Ambulatory Visit: Payer: 59 | Attending: Internal Medicine

## 2019-08-09 DIAGNOSIS — Z23 Encounter for immunization: Secondary | ICD-10-CM

## 2019-08-09 NOTE — Procedures (Signed)
   NAME: Gregory Flores DATE OF BIRTH:  [birth year] MEDICAL RECORD NUMBER [id number]  LOCATION: [facility]  PHYSICIAN: Deretha Emory  DATE OF STUDY: 08/01/2019  SLEEP STUDY TYPE: Positive Airway Pressure Titration               REFERRING PHYSICIAN: Deretha Emory, MD  INDICATION FOR STUDY: Obstructive sleep apnea and central sleep apnea, incompletely resolved with auto adjusting CPAP.   EPWORTH SLEEPINESS SCORE:   HEIGHT: 5\' 7"  (170.2 cm)  WEIGHT: 240 lb (108.9 kg)    Body mass index is 37.59 kg/m.  NECK SIZE: 16 in.  MEDICATIONS Patient self administered medications include: LOPID, NEURONTIN. Medications administered during study include No sleep medicine administered.  SLEEP STUDY TECHNIQUE The patient underwent an attended overnight polysomnography titration to assess the effects of cpap therapy. The following variables were monitored: EEG (C4-A1, C3-A2, O1-A2, O2-A1, F3-M2, F4-M1), EOG, submental and leg EMG, ECG, oxyhemoglobin saturation by pulse oximetry, thoracic and abdominal respiratory effort belts, nasal/oral airflow by pressure sensor, body position sensor and snoring sensor. CPAP pressure was titrated to eliminate apneas, hypopneas and oxygen desaturation.  TECHNICAL COMMENTS Comments added by Technician: RBD observed during the night. Comments added by Scorer: N/A  SLEEP ARCHITECTURE The study was initiated at 10:42:37 PM and terminated at 5:02:34 AM. The total recorded time was 379.9 minutes. EEG confirmed total sleep time was 315.5 minutes yielding a sleep efficiency of 83.0%%. Sleep onset after lights out was 22.4 minutes with a REM latency of 46.5 minutes. The patient spent 7.9%% of the night in stage N1 sleep, 56.9%% in stage N2 sleep, 18.7%% in stage N3 and 16.5% in REM. Wake after sleep onset (WASO) was 42.1 minutes. The Arousal Index was 6.1/hour.  RESPIRATORY PARAMETERS The most appropriate setting of ASV was EPAP Min 0 and Max 7 cmH2O,  Pressure Support Min 3 and Max 15 cmH2O with Max Pressure of 25 cmH2O and Breath Rate of 0 BrPM.  LEG MOVEMENT DATA The periodic limb movement index was 0.0/hour with an associated arousal index of /hour.  CARDIAC DATA The underlying cardiac rhythm was most consistent with sinus rhythm. Mean heart rate during sleep was N/A bpm. Additional rhythm abnormalities include None.  IMPRESSIONS - Adequate ASV titration in patient with Obstructive Sleep apnea(OSA) and Central Sleep Apnea (CSA) - Episodes of apparent dream enactment with arm and leg movements and vocalization during REM sleep - Mildly diminished REM latency  DIAGNOSIS - Obstructive Sleep Apnea (327.23 [G47.33 ICD-10]) - Central Sleep Apnea - Dream enactment  RECOMMENDATIONS - Trial of SV Advance EPAP Min 0 and Max 7 cmH2O, Pressure Support Min 3 and Max 15 cmH2O with Max Pressure of 22 cmH2O and Backup Breath Rate of 12 BrPM - Review of medications that may be causing dream enactment  Marland Kitchen Sleep specialist,  American Board of Internal Medicine  ELECTRONICALLY SIGNED ON:  08/09/2019, 8:50 PM [place] PH: [phone]   FX: [phone] ACCREDITED BY THE AMERICAN ACADEMY OF SLEEP MEDICINE

## 2019-08-09 NOTE — Progress Notes (Signed)
   Covid-19 Vaccination Clinic  Name:  Gregory Flores    MRN: [id number] DOB: 1980/05/11  08/09/2019  [name] was observed post Covid-19 immunization for 15 minutes without incident. He was provided with Vaccine Information Sheet and instruction to access the V-Safe system.   [name] was instructed to call 911 with any severe reactions post vaccine: Marland Kitchen Difficulty breathing  . Swelling of face and throat  . A fast heartbeat  . A bad rash all over body  . Dizziness and weakness   Immunizations Administered    Name Date Dose VIS Date Route   Pfizer COVID-19 Vaccine 08/09/2019 10:33 AM 0.3 mL 06/07/2018 Intramuscular   Manufacturer: [employer], [employer]   Lot: [id number]   NDC: [address]-1

## 2019-12-01 ENCOUNTER — Encounter (HOSPITAL_BASED_OUTPATIENT_CLINIC_OR_DEPARTMENT_OTHER): Payer: Self-pay

## 2019-12-01 DIAGNOSIS — G4710 Hypersomnia, unspecified: Secondary | ICD-10-CM

## 2019-12-25 ENCOUNTER — Other Ambulatory Visit: Payer: Self-pay

## 2019-12-25 ENCOUNTER — Ambulatory Visit (HOSPITAL_BASED_OUTPATIENT_CLINIC_OR_DEPARTMENT_OTHER): Payer: 59 | Attending: Internal Medicine | Admitting: Internal Medicine

## 2019-12-25 DIAGNOSIS — Z9989 Dependence on other enabling machines and devices: Secondary | ICD-10-CM | POA: Insufficient documentation

## 2019-12-25 DIAGNOSIS — G4710 Hypersomnia, unspecified: Secondary | ICD-10-CM | POA: Diagnosis not present

## 2019-12-25 DIAGNOSIS — Z6838 Body mass index (BMI) 38.0-38.9, adult: Secondary | ICD-10-CM | POA: Insufficient documentation

## 2019-12-25 DIAGNOSIS — Z79899 Other long term (current) drug therapy: Secondary | ICD-10-CM | POA: Insufficient documentation

## 2019-12-25 DIAGNOSIS — G4733 Obstructive sleep apnea (adult) (pediatric): Secondary | ICD-10-CM | POA: Insufficient documentation

## 2019-12-25 DIAGNOSIS — G4731 Primary central sleep apnea: Secondary | ICD-10-CM | POA: Insufficient documentation

## 2019-12-26 ENCOUNTER — Ambulatory Visit (HOSPITAL_BASED_OUTPATIENT_CLINIC_OR_DEPARTMENT_OTHER): Payer: 59 | Attending: Internal Medicine | Admitting: Internal Medicine

## 2019-12-26 DIAGNOSIS — Z79899 Other long term (current) drug therapy: Secondary | ICD-10-CM | POA: Diagnosis not present

## 2019-12-26 DIAGNOSIS — Z9989 Dependence on other enabling machines and devices: Secondary | ICD-10-CM | POA: Insufficient documentation

## 2019-12-26 DIAGNOSIS — Z6838 Body mass index (BMI) 38.0-38.9, adult: Secondary | ICD-10-CM | POA: Diagnosis not present

## 2019-12-26 DIAGNOSIS — G4710 Hypersomnia, unspecified: Secondary | ICD-10-CM

## 2019-12-26 DIAGNOSIS — G4733 Obstructive sleep apnea (adult) (pediatric): Secondary | ICD-10-CM | POA: Insufficient documentation

## 2019-12-26 DIAGNOSIS — G4711 Idiopathic hypersomnia with long sleep time: Secondary | ICD-10-CM | POA: Diagnosis not present

## 2019-12-26 DIAGNOSIS — G4731 Primary central sleep apnea: Secondary | ICD-10-CM | POA: Diagnosis not present

## 2019-12-30 NOTE — Procedures (Signed)
    NAME: Gregory Flores DATE OF BIRTH:  1981/01/14 MEDICAL RECORD NUMBER [id number]  LOCATION: [facility]  PHYSICIAN: Deretha Emory  DATE OF STUDY: 12/26/2019  SLEEP STUDY TYPE: Multiple Sleep Latency Test               REFERRING PHYSICIAN: No ref. provider found  INDICATION FOR STUDY: excessive daytime sleepiness despite adequately controlled CSA and OSA   EPWORTH SLEEPINESS SCORE:  20 HEIGHT: 5\' 7"  (170.2 cm)  WEIGHT: 245 lb (111.1 kg)    Body mass index is 38.37 kg/m.  NECK SIZE: 17 in.  MEDICATIONS Patient self administered medications include: GEMFIBROZIL. Medications administered during study include No sleep medicine administered.  SLEEP STUDY TECHNIQUE A multiple sleep latency test was performed. The channels recorded and monitored were central and occipital EEG, electrooculogram (EOG), submentalis EMG (chin), and electrocardiogram.  TECHNICAL COMMENTS Comments added by Technician: None Comments added by Scorer: N/A  IMPRESSIONS - No sleep onset REMs present. This study does not suggest narcolepsy. - Total number of naps attempted: 5 . Total number of naps with sleep attained: - Pathologic sleepiness was evidenced by short mean sleep latency of 1:58 minutes  DIAGNOSIS - Idiopathic hypersomnia (G47.11)  RECOMMENDATIONS - Return for follow up and management of Idiopathic Hypersomnia.  Marland Kitchen Sleep specialist, American Board of Internal Medicine  ELECTRONICALLY SIGNED ON:  12/30/2019, 11:17 AM  [facility] PH: [phone]   FX: [phone] ACCREDITED BY THE AMERICAN ACADEMY OF SLEEP MEDICINE

## 2019-12-30 NOTE — Procedures (Signed)
   NAME: Gregory Flores DATE OF BIRTH:  August 07, 1980 MEDICAL RECORD NUMBER [id number]  LOCATION: [facility]  PHYSICIAN: Deretha Emory  DATE OF STUDY: 12/25/2019  SLEEP STUDY TYPE: Positive Airway Pressure Titration               REFERRING PHYSICIAN: Deretha Emory, MD  INDICATION FOR STUDY: severe daytime sleepiness despite effective use of ASV for OSA and CSA  EPWORTH SLEEPINESS SCORE:  20 HEIGHT: 5\' 7"  (170.2 cm)  WEIGHT: 245 lb (111.1 kg)    Body mass index is 38.37 kg/m.  NECK SIZE: 17 in.  MEDICATIONS Patient self administered medications include: GEMFIBROZIL. Medications administered during study include No sleep medicine administered.  SLEEP STUDY TECHNIQUE The patient underwent an attended overnight polysomnography titration to assess the effects of cpap therapy. The following variables were monitored: EEG (C4-A1, C3-A2, O1-A2, O2-A1, F3-M2, F4-M1), EOG, submental and leg EMG, ECG, oxyhemoglobin saturation by pulse oximetry, thoracic and abdominal respiratory effort belts, nasal/oral airflow by pressure sensor, body position sensor and snoring sensor. CPAP pressure was titrated to eliminate apneas, hypopneas and oxygen desaturation.  TECHNICAL COMMENTS Comments added by Technician: NO REST ROOM VIST. Patient talked in his/her sleep. Comments added by Scorer: N/A  SLEEP ARCHITECTURE The study was initiated at 11:04:44 PM and terminated at 5:54:09 AM. The total recorded time was 409.4 minutes. EEG confirmed total sleep time was 395 minutes yielding a sleep efficiency of 96.5%%. Sleep onset after lights out was 3.7 minutes with a REM latency of 62.5 minutes. The patient spent 2.3%% of the night in stage N1 sleep, 67.1%% in stage N2 sleep, 11.6%% in stage N3 and 19% in REM. Wake after sleep onset (WASO) was 10.7 minutes. The Arousal Index was 18.1/hour.  RESPIRATORY PARAMETERS The patient was monitored on ASV through the night to be sure that CSA and OSA  were well treated. The most appropriate setting of ASV was EPAP Min 4 and Max 7 cmH2O, Pressure Support Min 4 and Max 15 cmH2O with Max Pressure of 25 cmH2O.  LEG MOVEMENT DATA The periodic limb movement index was 0.0/hour with an associated arousal index of /hour.  CARDIAC DATA The underlying cardiac rhythm was most consistent with sinus rhythm. Mean heart rate during sleep was 63.3 bpm. Additional rhythm abnormalities include None.  IMPRESSIONS - Obstructive Sleep apnea(OSA). - Central Sleep Apnea (CSA) - ASV treats apnea well. May proceed with MSLT  DIAGNOSIS - Excessive daytime sleepiness - Obstructive sleep apnea - Central sleep apnea  RECOMMENDATIONS - May proceed with MSLT  Marland Kitchen Sleep specialist, American Board of Internal Medicine  ELECTRONICALLY SIGNED ON:  12/30/2019, 11:05 AM  [facility] PH: [phone]   FX: [phone] ACCREDITED BY THE AMERICAN ACADEMY OF SLEEP MEDICINE

## 2020-01-26 ENCOUNTER — Other Ambulatory Visit: Payer: Self-pay | Admitting: Family Medicine

## 2020-01-26 DIAGNOSIS — N63 Unspecified lump in unspecified breast: Secondary | ICD-10-CM

## 2020-02-23 ENCOUNTER — Ambulatory Visit
Admission: RE | Admit: 2020-02-23 | Discharge: 2020-02-23 | Disposition: A | Discharge status: Home / Self Care | Payer: 59 | Source: Ambulatory Visit | Attending: Family Medicine | Admitting: Family Medicine

## 2020-02-23 ENCOUNTER — Other Ambulatory Visit: Payer: Self-pay

## 2020-02-23 ENCOUNTER — Ambulatory Visit: Payer: 59

## 2020-02-23 DIAGNOSIS — N63 Unspecified lump in unspecified breast: Secondary | ICD-10-CM

## 2020-06-11 DIAGNOSIS — G4731 Primary central sleep apnea: Secondary | ICD-10-CM | POA: Diagnosis not present

## 2020-07-03 DIAGNOSIS — G4711 Idiopathic hypersomnia with long sleep time: Secondary | ICD-10-CM | POA: Diagnosis not present

## 2020-07-03 DIAGNOSIS — G4733 Obstructive sleep apnea (adult) (pediatric): Secondary | ICD-10-CM | POA: Diagnosis not present

## 2020-07-17 DIAGNOSIS — I872 Venous insufficiency (chronic) (peripheral): Secondary | ICD-10-CM | POA: Diagnosis not present

## 2020-07-17 DIAGNOSIS — I890 Lymphedema, not elsewhere classified: Secondary | ICD-10-CM | POA: Diagnosis not present

## 2020-07-17 DIAGNOSIS — L03115 Cellulitis of right lower limb: Secondary | ICD-10-CM | POA: Diagnosis not present

## 2020-07-17 DIAGNOSIS — E785 Hyperlipidemia, unspecified: Secondary | ICD-10-CM | POA: Diagnosis not present

## 2020-08-02 DIAGNOSIS — G43709 Chronic migraine without aura, not intractable, without status migrainosus: Secondary | ICD-10-CM | POA: Diagnosis not present

## 2020-09-10 ENCOUNTER — Other Ambulatory Visit: Payer: Self-pay

## 2020-09-10 ENCOUNTER — Encounter: Payer: Self-pay | Admitting: Vascular Surgery

## 2020-09-10 ENCOUNTER — Ambulatory Visit: Payer: BC Managed Care – PPO | Admitting: Vascular Surgery

## 2020-09-10 VITALS — BP 111/77 | HR 83 | Temp 97.9°F | Resp 20 | Ht 67.0 in | Wt 273.0 lb

## 2020-09-10 DIAGNOSIS — I890 Lymphedema, not elsewhere classified: Secondary | ICD-10-CM

## 2020-09-10 NOTE — Progress Notes (Signed)
[facility] [place]  ASSESSMENT / PLAN: 40 y.o. male with primary lymphadema tarda of bilateral lower extremities with unclear etiology. No history of trauma / surgery, international travel, etc. to explain lower extremity lymphadema. He has tried lymphadema pumps without much success. I think he would benefit most from evaluation by a dedicated lymphedema program. Will refer to [facility] plastic surgery. Will follow the patient locally and help direct his care. Follow up with me in 6 months.   CHIEF COMPLAINT: lymphadema  HISTORY OF PRESENT ILLNESS: Gregory Flores is a 40 y.o. male with known history of bilateral lower extremity lymphedema.  This began several years ago.  Compression therapy was initiated which did not work well for him.  My partner, Dr. Arbie Cookey, saw the patient in consultation 04/04/2019 and recommended sequential compression pump therapy.  He reports he is somewhat compliant with the pump therapy.  He has debilitating migraines and is not always able to apply his pumps when he has a severe headache.  He reports the lymphedema has progressed despite his efforts.  He reports no trauma, surgery, or travel outside the country  Past Medical History:  Diagnosis Date  . Arthritis   . Bulging lumbar disc   . Chronic lumbar pain   . Depression   . Drug-seeking behavior   . Kidney stone   . Kidney stones   . Night terrors, adult   . Renal disorder     Past Surgical History:  Procedure Laterality Date  . BLADDER STONE REMOVAL    . LITHOTRIPSY      History reviewed. No pertinent family history.  Social History   Socioeconomic History  . Marital status: Married    Spouse name: [place]  . Number of children: 1  . Years of education: GED  . Highest education level: Not on file  Occupational History  . Occupation: Engineer, maintenance (IT): [employer]  Tobacco Use  . Smoking status: Current Every Day Smoker    Packs/day: 0.50    Years: 7.00    Pack  years: 3.50    Types: Cigarettes  . Smokeless tobacco: Never Used  Vaping Use  . Vaping Use: Never used  Substance and Sexual Activity  . Alcohol use: No  . Drug use: No  . Sexual activity: Not on file  Other Topics Concern  . Not on file  Social History Narrative   Patient lives at home with family.   Caffeine Use: 2 sodas weekly   Social Determinants of Health   Financial Resource Strain: Not on file  Food Insecurity: Not on file  Transportation Needs: Not on file  Physical Activity: Not on file  Stress: Not on file  Social Connections: Not on file  Intimate Partner Violence: Not on file    No Known Allergies  Current Outpatient Medications  Medication Sig Dispense Refill  . amphetamine-dextroamphetamine (ADDERALL XR) 20 MG 24 hr capsule TAKE 1 CAPSULE BY MOUTH EVERY MORNING FOR ADHD    . Buprenorphine HCl-Naloxone HCl 8-2 MG FILM DISSOLVE 3/4 OF A FILM UNDER THE TONGUE BID FOR P    . buPROPion (WELLBUTRIN SR) 100 MG 12 hr tablet TK 1 T PO  BID    . gabapentin (NEURONTIN) 600 MG tablet Take 600 mg by mouth 3 (three) times daily.  2  . gemfibrozil (LOPID) 600 MG tablet Take 600 mg by mouth 2 (two) times daily.    Marland Kitchen ibuprofen (ADVIL,MOTRIN) 200 MG tablet Take 800 mg by mouth  every 6 (six) hours as needed for moderate pain.    Marland Kitchen lamoTRIgine (LAMICTAL) 100 MG tablet Take 100 mg by mouth every morning.  2  . propranolol (INDERAL) 80 MG tablet Take 80 mg by mouth daily.    . rizatriptan (MAXALT) 10 MG tablet Take by mouth.    . Fremanezumab-vfrm (AJOVY) 225 MG/1.5ML SOAJ Inject into the skin. (Patient not taking: Reported on 09/10/2020)     No current facility-administered medications for this visit.    REVIEW OF SYSTEMS:  [X]  denotes positive finding, [ ]  denotes negative finding Cardiac  Comments:  Chest pain or chest pressure:    Shortness of breath upon exertion:    Short of breath when lying flat:    Irregular heart rhythm:        Vascular    Pain in calf, thigh,  or hip brought on by ambulation:    Pain in feet at night that wakes you up from your sleep:     Blood clot in your veins:    Leg swelling:  x       Pulmonary    Oxygen at home:    Productive cough:     Wheezing:         Neurologic    Sudden weakness in arms or legs:     Sudden numbness in arms or legs:     Sudden onset of difficulty speaking or slurred speech:    Temporary loss of vision in one eye:     Problems with dizziness:         Gastrointestinal    Blood in stool:     Vomited blood:         Genitourinary    Burning when urinating:     Blood in urine:        Psychiatric    Major depression:         Hematologic    Bleeding problems:    Problems with blood clotting too easily:        Skin    Rashes or ulcers:        Constitutional    Fever or chills:      PHYSICAL EXAM  Vitals:   09/10/20 1321  BP: 111/77  Pulse: 83  Resp: 20  Temp: 97.9 F (36.6 C)  SpO2: 95%  Weight: 273 lb (123.8 kg)  Height: 5\' 7"  (1.702 m)    Constitutional: Well appearing. no distress. Appears well nourished.  Neurologic: CN intact. no focal findings. no sensory loss. Psychiatric: Mood and affect symmetric and appropriate. Eyes: No icterus. No conjunctival pallor. Ears, nose, throat: mucous membranes moist. Midline trachea.  Cardiac: regular rate and rhythm.  Respiratory: unlabored. Abdominal: soft, non-tender, non-distended.  Peripheral vascular:  Lymphedema with lipodermatosclerosis bilaterally Extremity: No edema. No cyanosis. No pallor.  Skin: No gangrene. No ulceration.  Lymphatic: +Stemmer's sign. No palpable lymphadenopathy.  PERTINENT LABORATORY AND RADIOLOGIC DATA  Most recent CBC CBC Latest Ref Rng & Units 08/30/2017 11/11/2016 06/17/2016  WBC 4.0 - 10.5 K/uL 7.0 7.1 6.5  Hemoglobin 13.0 - 17.0 g/dL 09/01/2017 01/11/2017 08/17/2016  Hematocrit 39.0 - 52.0 % 43.9 41.8 42.5  Platelets 150 - 400 K/uL 296 207 238     Most recent CMP CMP Latest Ref Rng & Units 08/30/2017 11/11/2016  08/11/2016  Glucose 65 - 99 mg/dL 89 92 89  BUN 6 - 20 mg/dL 13 13 18   Creatinine 0.61 - 1.24 mg/dL 09/01/2017 01/11/2017 10/11/2016  Sodium 135 - 145  mmol/L 140 141 138  Potassium 3.5 - 5.1 mmol/L 4.0 3.7 3.8  Chloride 101 - 111 mmol/L 102 101 104  CO2 22 - 32 mmol/L 28 32 26  Calcium 8.9 - 10.3 mg/dL 9.3 9.0 9.3  Total Protein 6.5 - 8.1 g/dL - - -  Total Bilirubin 0.3 - 1.2 mg/dL - - -  Alkaline Phos 38 - 126 U/L - - -  AST 15 - 41 U/L - - -  ALT 17 - 63 U/L - - -   Cartrell Bentsen N. Lenell Antu, MD [facility] Phone Number: [phone] 09/10/2020 1:42 PM

## 2020-10-02 DIAGNOSIS — G4737 Central sleep apnea in conditions classified elsewhere: Secondary | ICD-10-CM | POA: Diagnosis not present

## 2020-10-02 DIAGNOSIS — G4711 Idiopathic hypersomnia with long sleep time: Secondary | ICD-10-CM | POA: Diagnosis not present

## 2020-10-02 DIAGNOSIS — G4733 Obstructive sleep apnea (adult) (pediatric): Secondary | ICD-10-CM | POA: Diagnosis not present

## 2020-11-26 DIAGNOSIS — Z79899 Other long term (current) drug therapy: Secondary | ICD-10-CM | POA: Diagnosis not present

## 2020-11-26 DIAGNOSIS — F419 Anxiety disorder, unspecified: Secondary | ICD-10-CM | POA: Diagnosis not present

## 2020-11-26 DIAGNOSIS — G43909 Migraine, unspecified, not intractable, without status migrainosus: Secondary | ICD-10-CM | POA: Diagnosis not present

## 2020-12-30 DIAGNOSIS — G4733 Obstructive sleep apnea (adult) (pediatric): Secondary | ICD-10-CM | POA: Diagnosis not present

## 2020-12-30 DIAGNOSIS — G4711 Idiopathic hypersomnia with long sleep time: Secondary | ICD-10-CM | POA: Diagnosis not present

## 2021-01-23 DIAGNOSIS — E782 Mixed hyperlipidemia: Secondary | ICD-10-CM | POA: Diagnosis not present

## 2021-01-23 DIAGNOSIS — Z0000 Encounter for general adult medical examination without abnormal findings: Secondary | ICD-10-CM | POA: Diagnosis not present

## 2021-01-23 DIAGNOSIS — Z8269 Family history of other diseases of the musculoskeletal system and connective tissue: Secondary | ICD-10-CM | POA: Diagnosis not present

## 2021-01-23 DIAGNOSIS — Z23 Encounter for immunization: Secondary | ICD-10-CM | POA: Diagnosis not present

## 2021-01-23 DIAGNOSIS — M545 Low back pain, unspecified: Secondary | ICD-10-CM | POA: Diagnosis not present

## 2021-01-23 DIAGNOSIS — Z1159 Encounter for screening for other viral diseases: Secondary | ICD-10-CM | POA: Diagnosis not present

## 2021-03-19 DIAGNOSIS — G43909 Migraine, unspecified, not intractable, without status migrainosus: Secondary | ICD-10-CM | POA: Diagnosis not present

## 2021-03-19 DIAGNOSIS — Z79899 Other long term (current) drug therapy: Secondary | ICD-10-CM | POA: Diagnosis not present

## 2021-03-21 DIAGNOSIS — Z79891 Long term (current) use of opiate analgesic: Secondary | ICD-10-CM | POA: Diagnosis not present

## 2021-11-25 ENCOUNTER — Encounter (HOSPITAL_COMMUNITY): Payer: Self-pay

## 2021-11-25 ENCOUNTER — Other Ambulatory Visit: Payer: Self-pay

## 2021-11-25 ENCOUNTER — Emergency Department (HOSPITAL_COMMUNITY)
Admission: EM | Admit: 2021-11-25 | Discharge: 2021-11-25 | Disposition: A | Discharge status: Home / Self Care | Payer: 59 | Attending: Emergency Medicine | Admitting: Emergency Medicine

## 2021-11-25 ENCOUNTER — Emergency Department (HOSPITAL_COMMUNITY): Payer: 59

## 2021-11-25 ENCOUNTER — Telehealth (HOSPITAL_COMMUNITY): Payer: Self-pay | Admitting: Emergency Medicine

## 2021-11-25 DIAGNOSIS — R109 Unspecified abdominal pain: Secondary | ICD-10-CM | POA: Diagnosis present

## 2021-11-25 DIAGNOSIS — N138 Other obstructive and reflux uropathy: Secondary | ICD-10-CM | POA: Insufficient documentation

## 2021-11-25 DIAGNOSIS — F17210 Nicotine dependence, cigarettes, uncomplicated: Secondary | ICD-10-CM | POA: Insufficient documentation

## 2021-11-25 LAB — CBC WITH DIFFERENTIAL/PLATELET
Abs Immature Granulocytes: 0.05 10*3/uL (ref 0.00–0.07)
Basophils Absolute: 0.1 10*3/uL (ref 0.0–0.1)
Basophils Relative: 0 %
Eosinophils Absolute: 0.1 10*3/uL (ref 0.0–0.5)
Eosinophils Relative: 1 %
HCT: 44.3 % (ref 39.0–52.0)
Hemoglobin: 15.6 g/dL (ref 13.0–17.0)
Immature Granulocytes: 0 %
Lymphocytes Relative: 26 %
Lymphs Abs: 3.6 10*3/uL (ref 0.7–4.0)
MCH: 31.1 pg (ref 26.0–34.0)
MCHC: 35.2 g/dL (ref 30.0–36.0)
MCV: 88.4 fL (ref 80.0–100.0)
Monocytes Absolute: 1.4 10*3/uL — ABNORMAL HIGH (ref 0.1–1.0)
Monocytes Relative: 10 %
Neutro Abs: 8.6 10*3/uL — ABNORMAL HIGH (ref 1.7–7.7)
Neutrophils Relative %: 63 %
Platelets: 270 10*3/uL (ref 150–400)
RBC: 5.01 MIL/uL (ref 4.22–5.81)
RDW: 13.3 % (ref 11.5–15.5)
WBC: 13.8 10*3/uL — ABNORMAL HIGH (ref 4.0–10.5)
nRBC: 0 % (ref 0.0–0.2)

## 2021-11-25 LAB — URINALYSIS, ROUTINE W REFLEX MICROSCOPIC
Bilirubin Urine: NEGATIVE
Glucose, UA: NEGATIVE mg/dL
Ketones, ur: NEGATIVE mg/dL
Nitrite: NEGATIVE
Protein, ur: 30 mg/dL — AB
RBC / HPF: >50 RBC/hpf — ABNORMAL HIGH (ref 0–5)
Specific Gravity, Urine: 1.023 (ref 1.005–1.030)
pH: 5 (ref 5.0–8.0)

## 2021-11-25 LAB — COMPREHENSIVE METABOLIC PANEL
ALT: 20 U/L (ref 0–44)
AST: 17 U/L (ref 15–41)
Albumin: 4.1 g/dL (ref 3.5–5.0)
Alkaline Phosphatase: 58 U/L (ref 38–126)
Anion gap: 12 (ref 5–15)
BUN: 15 mg/dL (ref 6–20)
CO2: 21 mmol/L — ABNORMAL LOW (ref 22–32)
Calcium: 9.5 mg/dL (ref 8.9–10.3)
Chloride: 107 mmol/L (ref 98–111)
Creatinine, Ser: 1.47 mg/dL — ABNORMAL HIGH (ref 0.61–1.24)
GFR, Estimated: >60 mL/min (ref >60)
Glucose, Bld: 87 mg/dL (ref 70–99)
Potassium: 3.3 mmol/L — ABNORMAL LOW (ref 3.5–5.1)
Sodium: 140 mmol/L (ref 135–145)
Total Bilirubin: 1.1 mg/dL (ref 0.3–1.2)
Total Protein: 7 g/dL (ref 6.5–8.1)

## 2021-11-25 LAB — LIPASE, BLOOD: Lipase: 31 U/L (ref 11–51)

## 2021-11-25 MED ORDER — ONDANSETRON HCL 4 MG/2ML IJ SOLN
4.0000 mg | Freq: Once | INTRAMUSCULAR | Status: AC
  Administered 2021-11-25: 4 mg via INTRAVENOUS
  Filled 2021-11-25: qty 2

## 2021-11-25 MED ORDER — TAMSULOSIN HCL 0.4 MG PO CAPS: 0.4000 mg | ORAL_CAPSULE | Freq: Every day | ORAL | 0 refills | Status: DC

## 2021-11-25 MED ORDER — IBUPROFEN 600 MG PO TABS: 600.0000 mg | ORAL_TABLET | Freq: Four times a day (QID) | ORAL | 0 refills | Status: AC | PRN

## 2021-11-25 MED ORDER — HYDROMORPHONE HCL 1 MG/ML IJ SOLN
1.0000 mg | Freq: Once | INTRAMUSCULAR | Status: AC
  Administered 2021-11-25: 1 mg via INTRAVENOUS
  Filled 2021-11-25: qty 1

## 2021-11-25 MED ORDER — OXYCODONE-ACETAMINOPHEN 5-325 MG PO TABS
2.0000 | ORAL_TABLET | Freq: Once | ORAL | Status: AC
  Administered 2021-11-25: 2 via ORAL
  Filled 2021-11-25: qty 2

## 2021-11-25 MED ORDER — ONDANSETRON 4 MG PO TBDP
4.0000 mg | ORAL_TABLET | Freq: Once | ORAL | Status: AC
  Administered 2021-11-25: 4 mg via ORAL
  Filled 2021-11-25: qty 1

## 2021-11-25 MED ORDER — ONDANSETRON HCL 4 MG PO TABS: 4.0000 mg | ORAL_TABLET | Freq: Four times a day (QID) | ORAL | 0 refills | Status: DC | PRN

## 2021-11-25 MED ORDER — CEPHALEXIN 500 MG PO CAPS: 500.0000 mg | ORAL_CAPSULE | Freq: Four times a day (QID) | ORAL | 0 refills | Status: AC

## 2021-11-25 MED ORDER — OXYCODONE HCL 5 MG PO TABS: 5.0000 mg | ORAL_TABLET | ORAL | 0 refills | Status: DC | PRN

## 2021-11-25 MED ORDER — ACETAMINOPHEN 325 MG PO TABS: 650.0000 mg | ORAL_TABLET | Freq: Four times a day (QID) | ORAL | 0 refills | Status: AC | PRN

## 2021-11-25 MED ORDER — LACTATED RINGERS IV BOLUS
2000.0000 mL | Freq: Once | INTRAVENOUS | Status: AC
Start: 2021-11-25 — End: 2021-11-25
  Administered 2021-11-25: 2000 mL via INTRAVENOUS

## 2021-11-25 MED ORDER — OXYCODONE-ACETAMINOPHEN 5-325 MG PO TABS: 1.0000 | ORAL_TABLET | Freq: Four times a day (QID) | ORAL | 0 refills | Status: DC | PRN

## 2021-11-25 NOTE — ED Triage Notes (Signed)
Pt BIB EMS with left sided back/flank pain. Pt has a hx of kidney stones.

## 2021-11-25 NOTE — ED Provider Notes (Signed)
[facility]-EMERGENCY DEPT Provider Note   CSN: [id number] Arrival date & time: 11/25/21  1517     History  Chief Complaint  Patient presents with   Back Pain   Flank Pain    Gregory Flores is a 41 y.o. male.  Patient as above with significant medical history as below, including renal disorder, recurrent nephrolithiasis, depression, chronic pain who presents to the ED with complaint of sided flank and back pain for the past 3 days.  Similar to prior kidney stones.  Has been telling p.o. intake without significant difficulty.  Mild nausea without vomiting.  No change to bowel function.  He has chronic hematuria unchanged over the past week.  Start taking Flomax earlier today otherwise no medications PTA  Previously followed with urology in [place] but has not establish care with urology here in [place].    Past Medical History:  Diagnosis Date   Arthritis    Bulging lumbar disc    Chronic lumbar pain    Depression    Drug-seeking behavior    Kidney stone    Kidney stones    Night terrors, adult    Renal disorder     Past Surgical History:  Procedure Laterality Date   BLADDER STONE REMOVAL     LITHOTRIPSY       The history is provided by the patient and a parent. No language interpreter was used.  Back Pain Associated symptoms: abdominal pain   Associated symptoms: no chest pain, no fever and no headaches   Flank Pain Associated symptoms include abdominal pain. Pertinent negatives include no chest pain, no headaches and no shortness of breath.       Home Medications Prior to Admission medications   Medication Sig Start Date End Date Taking? Authorizing Provider  acetaminophen (TYLENOL) 325 MG tablet Take 2 tablets (650 mg total) by mouth every 6 (six) hours as needed. 11/25/21  Yes Sloan Leiter, DO  buPROPion (WELLBUTRIN SR) 100 MG 12 hr tablet Take 100 mg by mouth 2 (two) times daily. 10/14/18  Yes [provider]  cephALEXin  (KEFLEX) 500 MG capsule Take 1 capsule (500 mg total) by mouth 4 (four) times daily for 7 days. 11/25/21 12/02/21 Yes Tanda Rockers A, DO  DULoxetine (CYMBALTA) 30 MG capsule Take 30 mg by mouth 2 (two) times daily. 11/05/21  Yes [provider]  gabapentin (NEURONTIN) 600 MG tablet Take 600 mg by mouth 3 (three) times daily. 07/27/17  Yes [provider]  gemfibrozil (LOPID) 600 MG tablet Take 600 mg by mouth 2 (two) times daily. 02/28/19  Yes [provider]  ibuprofen (ADVIL) 600 MG tablet Take 1 tablet (600 mg total) by mouth every 6 (six) hours as needed. 11/25/21  Yes Tanda Rockers A, DO  methylphenidate (RITALIN) 20 MG tablet Take 20 mg by mouth 2 (two) times daily.   Yes [provider]  ondansetron (ZOFRAN) 4 MG tablet Take 1 tablet (4 mg total) by mouth every 6 (six) hours as needed for nausea or vomiting. 11/25/21  Yes Tanda Rockers A, DO  oxyCODONE (ROXICODONE) 5 MG immediate release tablet Take 1 tablet (5 mg total) by mouth every 4 (four) hours as needed for severe pain. 11/25/21  Yes Sloan Leiter, DO  propranolol (INDERAL) 80 MG tablet Take 80 mg by mouth daily. 08/05/20  Yes [provider]  rizatriptan (MAXALT) 10 MG tablet Take 10 mg by mouth as needed for migraine. 09/03/20  Yes [provider]  tamsulosin (FLOMAX) 0.4 MG CAPS capsule Take 1 capsule (0.4 mg total) by mouth daily after breakfast for 14 days. 11/25/21 12/09/21 Yes Sloan Leiter, DO  Fremanezumab-vfrm (AJOVY) 225 MG/1.5ML SOAJ Inject into the skin. Patient not taking: Reported on 09/10/2020 03/01/19   [provider]      Allergies    Patient has no known allergies.    Review of Systems   Review of Systems  Constitutional:  Negative for chills and fever.  HENT:  Negative for facial swelling and trouble swallowing.   Eyes:  Negative for photophobia and visual disturbance.  Respiratory:  Negative for cough and shortness of breath.   Cardiovascular:  Negative for  chest pain and palpitations.  Gastrointestinal:  Positive for abdominal pain. Negative for nausea and vomiting.  Endocrine: Negative for polydipsia and polyuria.  Genitourinary:  Positive for flank pain and hematuria. Negative for difficulty urinating.  Musculoskeletal:  Positive for back pain. Negative for gait problem and joint swelling.  Skin:  Negative for pallor and rash.  Neurological:  Negative for syncope and headaches.  Psychiatric/Behavioral:  Negative for agitation and confusion.     Physical Exam Updated Vital Signs BP 117/78   Pulse 71   Temp 98.5 F (36.9 C) (Oral)   Resp 14   Ht 5\' 7"  (1.702 m)   Wt 93.9 kg   SpO2 96%   BMI 32.42 kg/m  Physical Exam Vitals and nursing note reviewed.  Constitutional:      General: He is not in acute distress.    Appearance: He is well-developed.  HENT:     Head: Normocephalic and atraumatic.     Right Ear: External ear normal.     Left Ear: External ear normal.     Mouth/Throat:     Mouth: Mucous membranes are moist.  Eyes:     General: No scleral icterus. Cardiovascular:     Rate and Rhythm: Normal rate and regular rhythm.     Pulses: Normal pulses.     Heart sounds: Normal heart sounds.  Pulmonary:     Effort: Pulmonary effort is normal. No respiratory distress.     Breath sounds: Normal breath sounds.  Abdominal:     General: Abdomen is flat.     Palpations: Abdomen is soft.     Tenderness: There is abdominal tenderness. There is no guarding or rebound.    Musculoskeletal:        General: Normal range of motion.     Cervical back: Normal range of motion.     Right lower leg: No edema.     Left lower leg: No edema.  Skin:    General: Skin is warm and dry.     Capillary Refill: Capillary refill takes less than 2 seconds.  Neurological:     Mental Status: He is alert and oriented to person, place, and time.  Psychiatric:        Mood and Affect: Mood normal.        Behavior: Behavior normal.     ED Results  / Procedures / Treatments   Labs (all labs ordered are listed, but only abnormal results are displayed) Labs Reviewed  COMPREHENSIVE METABOLIC PANEL - Abnormal; Notable for the following components:      Result Value   Potassium 3.3 (*)    CO2 21 (*)    Creatinine, Ser 1.47 (*)    All other components within normal limits  CBC WITH DIFFERENTIAL/PLATELET - Abnormal; Notable for the following components:  WBC 13.8 (*)    Neutro Abs 8.6 (*)    Monocytes Absolute 1.4 (*)    All other components within normal limits  URINALYSIS, ROUTINE W REFLEX MICROSCOPIC - Abnormal; Notable for the following components:   Color, Urine AMBER (*)    APPearance HAZY (*)    Hgb urine dipstick LARGE (*)    Protein, ur 30 (*)    Leukocytes,Ua TRACE (*)    RBC / HPF >50 (*)    Bacteria, UA RARE (*)    All other components within normal limits  URINE CULTURE  LIPASE, BLOOD    EKG None  Radiology CT RENAL STONE STUDY  Result Date: 11/25/2021 CLINICAL DATA:  Left flank pain. EXAM: CT ABDOMEN AND PELVIS WITHOUT CONTRAST TECHNIQUE: Multidetector CT imaging of the abdomen and pelvis was performed following the standard protocol without IV contrast. RADIATION DOSE REDUCTION: This exam was performed according to the departmental dose-optimization program which includes automated exposure control, adjustment of the mA and/or kV according to patient size and/or use of iterative reconstruction technique. COMPARISON:  May 16, 2018 FINDINGS: Lower chest: No acute abnormality. Hepatobiliary: No focal liver abnormality is seen. No gallstones, gallbladder wall thickening, or biliary dilatation. Pancreas: Unremarkable. No pancreatic ductal dilatation or surrounding inflammatory changes. Spleen: Normal in size without focal abnormality. Adrenals/Urinary Tract: Adrenal glands are unremarkable. Kidneys are normal in size, without focal lesions. A 4 mm nonobstructing renal calculus is seen within the mid to lower right  kidney. 2 mm and 3 mm nonobstructing renal calculi are also seen within the left kidney. A 7 mm obstructing renal calculus is seen within the proximal left ureter, with moderate severity left-sided hydronephrosis and hydroureter. The urinary bladder is poorly distended and subsequently limited in evaluation. Stomach/Bowel: Stomach is within normal limits. Appendix appears normal. No evidence of bowel wall thickening, distention, or inflammatory changes. Vascular/Lymphatic: No significant vascular findings are present. No enlarged abdominal or pelvic lymph nodes. Reproductive: Prostate is unremarkable. Other: No abdominal wall hernia or abnormality. No abdominopelvic ascites. Musculoskeletal: No acute or significant osseous findings. IMPRESSION: 1. 7 mm obstructing renal calculus within the proximal left ureter. 2. Bilateral nonobstructing renal calculi. Electronically Signed   By: Aram Candela M.D.   On: 11/25/2021 03:11    Procedures .Critical Care  Performed by: Sloan Leiter, DO Authorized by: Sloan Leiter, DO   Critical care provider statement:    Critical care time (minutes):  30   Critical care time was exclusive of:  Separately billable procedures and treating other patients   Critical care was necessary to treat or prevent imminent or life-threatening deterioration of the following conditions:  Dehydration   Critical care was time spent personally by me on the following activities:  Development of treatment plan with patient or surrogate, discussions with consultants, evaluation of patient's response to treatment, examination of patient, ordering and review of laboratory studies, ordering and review of radiographic studies, ordering and performing treatments and interventions, pulse oximetry, re-evaluation of patient's condition, review of old charts and obtaining history from patient or surrogate     Medications Ordered in ED Medications  oxyCODONE-acetaminophen (PERCOCET/ROXICET)  5-325 MG per tablet 2 tablet (2 tablets Oral Given 11/25/21 0240)  ondansetron (ZOFRAN-ODT) disintegrating tablet 4 mg (4 mg Oral Given 11/25/21 0240)  lactated ringers bolus 2,000 mL (2,000 mLs Intravenous New Bag/Given 11/25/21 0522)  HYDROmorphone (DILAUDID) injection 1 mg (1 mg Intravenous Given 11/25/21 0528)  ondansetron (ZOFRAN) injection 4 mg (4 mg Intravenous Given 11/25/21  [email or web address])    ED Course/ Medical Decision Making/ A&P                           Medical Decision Making Amount and/or Complexity of Data Reviewed Labs: ordered.  Risk OTC drugs. Prescription drug management.   This patient presents to the ED with chief complaint(s) of left flank pain with pertinent past medical history of nephrolithiasis, "sponge kidney" which further complicates the presenting complaint. The complaint involves an extensive differential diagnosis and also carries with it a high risk of complications and morbidity.    The differential diagnosis includes but not limited to  Differential diagnosis includes but is not exclusive to acute appendicitis, renal colic, testicular torsion, urinary tract infection, prostatitis,  diverticulitis, small bowel obstruction, for lithiasis, colitis, abdominal aortic aneurysm, gastroenteritis, constipation etc.  . Serious etiologies were considered.   The initial plan is to screening labs, urinalysis, CT renal   Additional history obtained: Additional history obtained from family/mom Records reviewed  prior ED visits, prior labs and imaging  Independent labs interpretation:  The following labs were independently interpreted:  Creatinine increased from baseline, potassium mildly depleted.  Mild leukocytosis 13.8.  Afebrile.  Not tachycardic.  UA with rare bacteria, 11-20 WBCs, trace leuks.  Greater than 50 RBCs.  He has flank pain, nephrolithiasis, send urine culture.  Start antibiotics.  Independent visualization of imaging: - I independently visualized the  following imaging with scope of interpretation limited to determining acute life threatening conditions related to emergency care: CT renal, which revealed obstructive nephropathy to the left, 7 millimeters, proximal left ureter  Cardiac monitoring was reviewed and interpreted by myself which shows NSR  Treatment and Reassessment: IV fluids, analgesics, antiemetics >> Symptoms improved, he is tolerating PO intake w/o difficulty Abdomen is soft, not peritoneal on exam   Consultation: - Consulted or discussed management/test interpretation w/ external professional: n/a  Consideration for admission or further workup: Admission was considered; however given improvement of symptoms, he is able to tolerate p.o. intake.  Not septic. Overall nontoxic appearance favor outpatient management of obstructive nephropathy.  Advised patient to call urology in the morning for appointment.  We will start patient on analgesics, antiemetics, Flomax, antibiotics for ?UTI.   The patient improved significantly and was discharged in stable condition. Detailed discussions were had with the patient regarding current findings, and need for close f/u with PCP or on call doctor. The patient has been instructed to return immediately if the symptoms worsen in any way for re-evaluation. Patient verbalized understanding and is in agreement with current care plan. All questions answered prior to discharge.    Social Determinants of health: Counseled patient for approximately 3 minutes regarding smoking cessation. Discussed risks of smoking and how they applied and affected their visit here today. Patient not ready to quit at this time, however will follow up with their primary doctor when they are.   CPT code: 08657: intermediate counseling for smoking cessation   Social History   Tobacco Use   Smoking status: Every Day    Packs/day: 0.50    Years: 7.00    Total pack years: 3.50    Types: Cigarettes   Smokeless  tobacco: Never  Vaping Use   Vaping Use: Never used  Substance Use Topics   Alcohol use: No   Drug use: No            Final Clinical Impression(s) / ED Diagnoses Final diagnoses:  Obstructive  nephropathy  Flank pain    Rx / DC Orders ED Discharge Orders          Ordered    cephALEXin (KEFLEX) 500 MG capsule  4 times daily        11/25/21 0531    oxyCODONE (ROXICODONE) 5 MG immediate release tablet  Every 4 hours PRN        11/25/21 0531    acetaminophen (TYLENOL) 325 MG tablet  Every 6 hours PRN        11/25/21 0531    ibuprofen (ADVIL) 600 MG tablet  Every 6 hours PRN        11/25/21 0531    tamsulosin (FLOMAX) 0.4 MG CAPS capsule  Daily after breakfast        11/25/21 0531    ondansetron (ZOFRAN) 4 MG tablet  Every 6 hours PRN        11/25/21 0601              Sloan LeiterGray, Makael Stein A, DO 11/25/21 [phone]

## 2021-11-25 NOTE — Discharge Instructions (Addendum)
It was a pleasure caring for you today in the emergency department.  Please return to the emergency department for any worsening or worrisome symptoms.   Please call urology later on today for follow up

## 2021-11-26 LAB — URINE CULTURE: Culture: NO GROWTH

## 2021-12-03 ENCOUNTER — Ambulatory Visit (INDEPENDENT_AMBULATORY_CARE_PROVIDER_SITE_OTHER): Payer: 59 | Admitting: Urology

## 2021-12-03 ENCOUNTER — Encounter: Payer: Self-pay | Admitting: Urology

## 2021-12-03 ENCOUNTER — Ambulatory Visit (HOSPITAL_COMMUNITY)
Admission: RE | Admit: 2021-12-03 | Discharge: 2021-12-03 | Disposition: A | Discharge status: Home / Self Care | Payer: 59 | Source: Ambulatory Visit | Attending: Urology | Admitting: Urology

## 2021-12-03 ENCOUNTER — Other Ambulatory Visit: Payer: Self-pay

## 2021-12-03 VITALS — BP 126/75 | HR 71

## 2021-12-03 DIAGNOSIS — N200 Calculus of kidney: Secondary | ICD-10-CM | POA: Insufficient documentation

## 2021-12-03 DIAGNOSIS — Z87442 Personal history of urinary calculi: Secondary | ICD-10-CM

## 2021-12-03 MED ORDER — TAMSULOSIN HCL 0.4 MG PO CAPS: 0.4000 mg | ORAL_CAPSULE | Freq: Every day | ORAL | 0 refills | Status: DC

## 2021-12-03 MED ORDER — OXYCODONE-ACETAMINOPHEN 5-325 MG PO TABS: 1.0000 | ORAL_TABLET | Freq: Four times a day (QID) | ORAL | 0 refills | Status: DC | PRN

## 2021-12-03 NOTE — H&P (View-Only) (Signed)
12/03/2021 10:06 AM   Gregory Flores 1981/03/08 [id number]  Referring provider: Laurann Montana, MD [phone] Gregory [address] [place],  [place] [address]  nephrolithiasis   HPI: Mr Gregory Flores is a 41yo here for evaluation for nephrolithiasis. He has passed over 200 stones in the past 10 years. He developed elft flank pain 10 days ago and presented to the ER and underwent CT. CT showed 93mm left proximal ureteral calculus. KUB from today shows 2 left proximal ureteral calculi. He has intermittent left flank pain. No significant LUTS   PMH: Past Medical History:  Diagnosis Date   Arthritis    Bulging lumbar disc    Chronic lumbar pain    Depression    Drug-seeking behavior    Kidney stone    Kidney stones    Night terrors, adult    Renal disorder     Surgical History: Past Surgical History:  Procedure Laterality Date   BLADDER STONE REMOVAL     LITHOTRIPSY      Home Medications:  Allergies as of 12/03/2021   No Known Allergies      Medication List        Accurate as of December 03, 2021 10:06 AM. If you have any questions, ask your nurse or doctor.          acetaminophen 325 MG tablet Commonly known as: Tylenol Take 2 tablets (650 mg total) by mouth every 6 (six) hours as needed.   Ajovy 225 MG/1.5ML Soaj Generic drug: Fremanezumab-vfrm Inject into the skin.   buPROPion ER 100 MG 12 hr tablet Commonly known as: WELLBUTRIN SR Take 100 mg by mouth 2 (two) times daily.   DULoxetine 30 MG capsule Commonly known as: CYMBALTA Take 30 mg by mouth 2 (two) times daily.   gabapentin 600 MG tablet Commonly known as: NEURONTIN Take 600 mg by mouth 3 (three) times daily.   gemfibrozil 600 MG tablet Commonly known as: LOPID Take 600 mg by mouth 2 (two) times daily.   ibuprofen 600 MG tablet Commonly known as: ADVIL Take 1 tablet (600 mg total) by mouth every 6 (six) hours as needed.   methylphenidate 20 MG tablet Commonly known as: RITALIN Take 20 mg by  mouth 2 (two) times daily.   ondansetron 4 MG tablet Commonly known as: ZOFRAN Take 1 tablet (4 mg total) by mouth every 6 (six) hours as needed for nausea or vomiting.   oxyCODONE 5 MG immediate release tablet Commonly known as: Roxicodone Take 1 tablet (5 mg total) by mouth every 4 (four) hours as needed for severe pain.   oxyCODONE-acetaminophen 5-325 MG tablet Commonly known as: PERCOCET/ROXICET Take 1 tablet by mouth every 6 (six) hours as needed for severe pain.   propranolol 80 MG tablet Commonly known as: INDERAL Take 80 mg by mouth daily.   rizatriptan 10 MG tablet Commonly known as: MAXALT Take 10 mg by mouth as needed for migraine.   tamsulosin 0.4 MG Caps capsule Commonly known as: FLOMAX Take 1 capsule (0.4 mg total) by mouth daily after breakfast for 14 days.        Allergies: No Known Allergies  Family History: No family history on file.  Social History:  reports that he has been smoking cigarettes. He has a 3.50 pack-year smoking history. He has never used smokeless tobacco. He reports that he does not drink alcohol and does not use drugs.  ROS: All other review of systems were reviewed and are negative except what is noted  above in HPI  Physical Exam: BP 126/75   Pulse 71   Constitutional:  Alert and oriented, No acute distress. HEENT: [place] AT, moist mucus membranes.  Trachea midline, no masses. Cardiovascular: No clubbing, cyanosis, or edema. Respiratory: Normal respiratory effort, no increased work of breathing. GI: Abdomen is soft, nontender, nondistended, no abdominal masses GU: No CVA tenderness.  Lymph: No cervical or inguinal lymphadenopathy. Skin: No rashes, bruises or suspicious lesions. Neurologic: Grossly intact, no focal deficits, moving all 4 extremities. Psychiatric: Normal mood and affect.  Laboratory Data: Lab Results  Component Value Date   WBC 13.8 (H) 11/25/2021   HGB 15.6 11/25/2021   HCT 44.3 11/25/2021   MCV 88.4  11/25/2021   PLT 270 11/25/2021    Lab Results  Component Value Date   CREATININE 1.47 (H) 11/25/2021    No results found for: "PSA"  No results found for: "TESTOSTERONE"  No results found for: "HGBA1C"  Urinalysis    Component Value Date/Time   COLORURINE AMBER (A) 11/25/2021 0245   APPEARANCEUR HAZY (A) 11/25/2021 0245   LABSPEC 1.023 11/25/2021 0245   PHURINE 5.0 11/25/2021 0245   GLUCOSEU NEGATIVE 11/25/2021 0245   HGBUR LARGE (A) 11/25/2021 0245   BILIRUBINUR NEGATIVE 11/25/2021 0245   KETONESUR NEGATIVE 11/25/2021 0245   PROTEINUR 30 (A) 11/25/2021 0245   UROBILINOGEN 1.0 01/28/2015 1407   NITRITE NEGATIVE 11/25/2021 0245   LEUKOCYTESUR TRACE (A) 11/25/2021 0245    Lab Results  Component Value Date   BACTERIA RARE (A) 11/25/2021    Pertinent Imaging: CT stone 8/14 and KUB today: Images reviewed and discussed with the patient  Results for orders placed during the hospital encounter of 11/11/16  DG Abdomen 1 View  Narrative CLINICAL DATA:  RIGHT flank pain, history kidney stones.  EXAM: ABDOMEN - 1 VIEW  COMPARISON:  CT urogram 10/09/2016.  FINDINGS: Normal bowel gas pattern. Small calcifications overlie the RIGHT renal outline consistent with calculi. The LEFT renal outline is obscured by stool. No definite ureteral calculi.  IMPRESSION: Suspected RIGHT nephrolithiasis. If further investigation desired, recommend CT urography.   Electronically Signed By: John T Curnes M.D. On: 11/11/2016 11:56  No results found for this or any previous visit.  No results found for this or any previous visit.  No results found for this or any previous visit.  Results for orders placed during the hospital encounter of 08/30/17  US Renal  Narrative CLINICAL DATA:  Left flank pain x1 week.  EXAM: RENAL / URINARY TRACT ULTRASOUND COMPLETE  COMPARISON:  CT from 01/11/2017  FINDINGS: Right Kidney:  Length: 9.5 cm. Echogenicity within normal limits.  Nonobstructing echogenic calculi are noted the largest measuring approximately 6 mm in the lower pole. No mass or hydronephrosis visualized.  Left Kidney:  Length: 10.7 cm. Echogenicity within normal limits. Multiple nonobstructing renal calculi are identified the largest measuring 6 mm in the upper pole. No mass or hydronephrosis visualized.  Bladder:  Appears normal for degree of bladder distention. Low level echoes within the bladder compatible with debris are identified. Ureteral jets are demonstrated bilaterally suggesting patency of both ureters.  IMPRESSION: 1. Bilateral nephrolithiasis without obstructive uropathy. 2. Minimal sediment or debris within the bladder.   Electronically Signed By: David  Kwon M.D. On: 08/30/2017 21:33  No results found for this or any previous visit.  No results found for this or any previous visit.  Results for orders placed during the hospital encounter of 11/25/21  CT RENAL STONE STUDY  Narrative CLINICAL   DATA:  Left flank pain.  EXAM: CT ABDOMEN AND PELVIS WITHOUT CONTRAST  TECHNIQUE: Multidetector CT imaging of the abdomen and pelvis was performed following the standard protocol without IV contrast.  RADIATION DOSE REDUCTION: This exam was performed according to the departmental dose-optimization program which includes automated exposure control, adjustment of the mA and/or kV according to patient size and/or use of iterative reconstruction technique.  COMPARISON:  May 16, 2018  FINDINGS: Lower chest: No acute abnormality.  Hepatobiliary: No focal liver abnormality is seen. No gallstones, gallbladder wall thickening, or biliary dilatation.  Pancreas: Unremarkable. No pancreatic ductal dilatation or surrounding inflammatory changes.  Spleen: Normal in size without focal abnormality.  Adrenals/Urinary Tract: Adrenal glands are unremarkable. Kidneys are normal in size, without focal lesions. A 4 mm nonobstructing  renal calculus is seen within the mid to lower right kidney. 2 mm and 3 mm nonobstructing renal calculi are also seen within the left kidney. A 7 mm obstructing renal calculus is seen within the proximal left ureter, with moderate severity left-sided hydronephrosis and hydroureter. The urinary bladder is poorly distended and subsequently limited in evaluation.  Stomach/Bowel: Stomach is within normal limits. Appendix appears normal. No evidence of bowel wall thickening, distention, or inflammatory changes.  Vascular/Lymphatic: No significant vascular findings are present. No enlarged abdominal or pelvic lymph nodes.  Reproductive: Prostate is unremarkable.  Other: No abdominal wall hernia or abnormality. No abdominopelvic ascites.  Musculoskeletal: No acute or significant osseous findings.  IMPRESSION: 1. 7 mm obstructing renal calculus within the proximal left ureter. 2. Bilateral nonobstructing renal calculi.   Electronically Signed By: Thaddeus  Houston M.D. On: 11/25/2021 03:11   Assessment & Plan:    1. NEPHROLITHIASIS, HX OF -We discussed the management of kidney stones. These options include observation, ureteroscopy, shockwave lithotripsy (ESWL) and percutaneous nephrolithotomy (PCNL). We discussed which options are relevant to the patient's stone(s). We discussed the natural history of kidney stones as well as the complications of untreated stones and the impact on quality of life without treatment as well as with each of the above listed treatments. We also discussed the efficacy of each treatment in its ability to clear the stone burden. With any of these management options I discussed the signs and symptoms of infection and the need for emergent treatment should these be experienced. For each option we discussed the ability of each procedure to clear the patient of their stone burden.   For observation I described the risks which include but are not limited to silent  renal damage, life-threatening infection, need for emergent surgery, failure to pass stone and pain.   For ureteroscopy I described the risks which include bleeding, infection, damage to contiguous structures, positioning injury, ureteral stricture, ureteral avulsion, ureteral injury, need for prolonged ureteral stent, inability to perform ureteroscopy, need for an interval procedure, inability to clear stone burden, stent discomfort/pain, heart attack, stroke, pulmonary embolus and the inherent risks with general anesthesia.   For shockwave lithotripsy I described the risks which include arrhythmia, kidney contusion, kidney hemorrhage, need for transfusion, pain, inability to adequately break up stone, inability to pass stone fragments, Steinstrasse, infection associated with obstructing stones, need for alternate surgical procedure, need for repeat shockwave lithotripsy, MI, CVA, PE and the inherent risks with anesthesia/conscious sedation.   For PCNL I described the risks including positioning injury, pneumothorax, hydrothorax, need for chest tube, inability to clear stone burden, renal laceration, arterial venous fistula or malformation, need for embolization of kidney, loss of kidney or renal function,   need for repeat procedure, need for prolonged nephrostomy tube, ureteral avulsion, MI, CVA, PE and the inherent risks of general anesthesia.   - The patient would like to proceed with left ureteroscopic stone extraction - Urinalysis, Routine w reflex microscopic   No follow-ups on file.  Wilkie Aye, MD  [facility] [place]

## 2021-12-03 NOTE — Patient Instructions (Signed)
Ureteroscopy  Ureteroscopy is a procedure to check for and treat problems inside part of the urinary tract. In this procedure, a long rigid or flexible tube with a lens and light at the end (ureteroscope) is used to look at the inside of the kidneys and the ureters. The ureters are the tubes that carry urine from the kidneys to the bladder. The ureteroscope is inserted into one or both of the ureters. You may need this procedure if you have frequent urinary tract infections (UTIs), blood in your urine, or a stone in one or both of your ureters. A ureteroscopy can be done: To find the cause of urine blockage in a ureter and to evaluate other abnormalities inside the ureters or kidneys. To remove stones. To remove or treat growths of tissue (polyps), abnormal tissue, and some types of tumors. To remove a tissue sample and check it for disease under a microscope (biopsy). Tell a health care provider about: Any allergies you have. All medicines you are taking, including vitamins, herbs, eye drops, creams, and over-the-counter medicines. Any problems you or family members have had with anesthetic medicines. Any bleeding problems you have. Any surgeries you have had. Any medical conditions you have. Whether you are pregnant or may be pregnant. What are the risks? Your health care provider will talk with you about risks. These may include: Abdominal pain or a burning feeling or pain while urinating. Abnormal bleeding. A UTI. Allergic reactions to medicines. Scarring that narrows the ureter (stricture) or swelling. Creating a hole (perforation) in the ureter. Damage to other structures or organs, such as the part of your body that drains urine from your bladder (urethra), your bladder, or your uterus. What happens before the procedure? When to stop eating and drinking  8 hours before your procedure Stop eating most foods. Do not eat meat, fried foods, or fatty foods. Eat only light foods, such  as toast or crackers. All liquids are okay except energy drinks and alcohol. 6 hours before your procedure Stop eating. Drink only clear liquids, such as water, clear fruit juice, black coffee, plain tea, and sports drinks. Do not drink energy drinks or alcohol. 2 hours before your procedure Stop drinking all liquids. You may be allowed to take medicines with small sips of water. Medicines Ask your health care provider about: Changing or stopping your regular medicines. These include any diabetes medicines or blood thinners you take. Taking medicines such as aspirin and ibuprofen. These medicines can thin your blood. Do not take these medicines unless your health care provider tells you to. Taking over-the-counter medicines, vitamins, herbs, and supplements. General instructions Do not use any products that contain nicotine or tobacco for at least 4 weeks before the procedure. These products include cigarettes, chewing tobacco, and vaping devices, such as e-cigarettes. If you need help quitting, ask your health care provider. If you will be going home right after the procedure, plan to have a responsible adult: Take you home from the hospital or clinic. You will not be allowed to drive. Care for you for the time you are told. Ask your health care provider what steps will be taken to help prevent infection. These may include: Washing skin with a soap that kills germs. Receiving antibiotic medicine. Tests You may have an exam or testing. You may have a urine sample taken to check for infection. What happens during the procedure? An IV will be inserted into one of your veins. You may be given: A sedative. This helps   you relax. Anesthesia. This will: Numb certain areas of your body. Make you fall asleep for surgery. Your urethra will be cleaned with a germ-killing solution. The ureteroscope will be passed through your urethra into your bladder. A salt-water solution will be sent through  the ureteroscope to fill your bladder. This will help the health care provider see the openings of your ureters more clearly. The ureteroscope will be passed into your ureter. If a growth is found, a biopsy may be done. If a stone is found, it may be removed through the ureteroscope, or the stone may be broken up using a laser, shock waves, or electrical energy. In some cases, if the ureter is too small, a tube may be inserted that keeps the ureter open (ureteral stent). The stent may be left in place for 1 or 2 weeks, and then the ureteroscopy procedure will be done again. The scope will be removed, and your bladder will be emptied. The procedure may vary among health care providers and hospitals. What happens after the procedure? Your blood pressure, heart rate, breathing rate, and blood oxygen level will be monitored until you leave the hospital or clinic. It is up to you to get the results of your procedure. Ask your health care provider, or the department that is doing the procedure, when your results will be ready. Summary Ureteroscopy is a procedure used to look at the inside of the kidneys and the ureters. You may need this procedure if you have frequent urinary tract infections (UTIs), blood in your urine, or a stone in one or both of your ureters. Follow instructions from your health care provider about eating and drinking. In some cases, if the ureter is too small, a tube may be inserted that keeps the ureter open (ureteral stent). The stent may be left in place for 1 or 2 weeks to keep the ureter open, and then the ureteroscopy procedure will be done again. This information is not intended to replace advice given to you by your health care provider. Make sure you discuss any questions you have with your health care provider. Document Revised: 07/25/2021 Document Reviewed: 07/25/2021 Elsevier Patient Education  2023 [employer]  

## 2021-12-03 NOTE — Progress Notes (Signed)
12/03/2021 10:06 AM   Abelino Derrick 1981/03/08 [id number]  Referring provider: Laurann Montana, MD [phone] [address] [place],  [place] [address]  nephrolithiasis   HPI: [name] is a 41yo here for evaluation for nephrolithiasis. He has passed over 200 stones in the past 10 years. He developed elft flank pain 10 days ago and presented to the ER and underwent CT. CT showed 93mm left proximal ureteral calculus. KUB from today shows 2 left proximal ureteral calculi. He has intermittent left flank pain. No significant LUTS   PMH: Past Medical History:  Diagnosis Date   Arthritis    Bulging lumbar disc    Chronic lumbar pain    Depression    Drug-seeking behavior    Kidney stone    Kidney stones    Night terrors, adult    Renal disorder     Surgical History: Past Surgical History:  Procedure Laterality Date   BLADDER STONE REMOVAL     LITHOTRIPSY      Home Medications:  Allergies as of 12/03/2021   No Known Allergies      Medication List        Accurate as of December 03, 2021 10:06 AM. If you have any questions, ask your nurse or doctor.          acetaminophen 325 MG tablet Commonly known as: Tylenol Take 2 tablets (650 mg total) by mouth every 6 (six) hours as needed.   Ajovy 225 MG/1.5ML Soaj Generic drug: Fremanezumab-vfrm Inject into the skin.   buPROPion ER 100 MG 12 hr tablet Commonly known as: WELLBUTRIN SR Take 100 mg by mouth 2 (two) times daily.   DULoxetine 30 MG capsule Commonly known as: CYMBALTA Take 30 mg by mouth 2 (two) times daily.   gabapentin 600 MG tablet Commonly known as: NEURONTIN Take 600 mg by mouth 3 (three) times daily.   gemfibrozil 600 MG tablet Commonly known as: LOPID Take 600 mg by mouth 2 (two) times daily.   ibuprofen 600 MG tablet Commonly known as: ADVIL Take 1 tablet (600 mg total) by mouth every 6 (six) hours as needed.   methylphenidate 20 MG tablet Commonly known as: RITALIN Take 20 mg by  mouth 2 (two) times daily.   ondansetron 4 MG tablet Commonly known as: ZOFRAN Take 1 tablet (4 mg total) by mouth every 6 (six) hours as needed for nausea or vomiting.   oxyCODONE 5 MG immediate release tablet Commonly known as: Roxicodone Take 1 tablet (5 mg total) by mouth every 4 (four) hours as needed for severe pain.   oxyCODONE-acetaminophen 5-325 MG tablet Commonly known as: PERCOCET/ROXICET Take 1 tablet by mouth every 6 (six) hours as needed for severe pain.   propranolol 80 MG tablet Commonly known as: INDERAL Take 80 mg by mouth daily.   rizatriptan 10 MG tablet Commonly known as: MAXALT Take 10 mg by mouth as needed for migraine.   tamsulosin 0.4 MG Caps capsule Commonly known as: FLOMAX Take 1 capsule (0.4 mg total) by mouth daily after breakfast for 14 days.        Allergies: No Known Allergies  Family History: No family history on file.  Social History:  reports that he has been smoking cigarettes. He has a 3.50 pack-year smoking history. He has never used smokeless tobacco. He reports that he does not drink alcohol and does not use drugs.  ROS: All other review of systems were reviewed and are negative except what is noted  above in HPI  Physical Exam: BP 126/75   Pulse 71   Constitutional:  Alert and oriented, No acute distress. HEENT: Ward AT, moist mucus membranes.  Trachea midline, no masses. Cardiovascular: No clubbing, cyanosis, or edema. Respiratory: Normal respiratory effort, no increased work of breathing. GI: Abdomen is soft, nontender, nondistended, no abdominal masses GU: No CVA tenderness.  Lymph: No cervical or inguinal lymphadenopathy. Skin: No rashes, bruises or suspicious lesions. Neurologic: Grossly intact, no focal deficits, moving all 4 extremities. Psychiatric: Normal mood and affect.  Laboratory Data: Lab Results  Component Value Date   WBC 13.8 (H) 11/25/2021   HGB 15.6 11/25/2021   HCT 44.3 11/25/2021   MCV 88.4  11/25/2021   PLT 270 11/25/2021    Lab Results  Component Value Date   CREATININE 1.47 (H) 11/25/2021    No results found for: "PSA"  No results found for: "TESTOSTERONE"  No results found for: "HGBA1C"  Urinalysis    Component Value Date/Time   COLORURINE AMBER (A) 11/25/2021 0245   APPEARANCEUR HAZY (A) 11/25/2021 0245   LABSPEC 1.023 11/25/2021 0245   PHURINE 5.0 11/25/2021 0245   GLUCOSEU NEGATIVE 11/25/2021 0245   HGBUR LARGE (A) 11/25/2021 0245   BILIRUBINUR NEGATIVE 11/25/2021 0245   KETONESUR NEGATIVE 11/25/2021 0245   PROTEINUR 30 (A) 11/25/2021 0245   UROBILINOGEN 1.0 01/28/2015 1407   NITRITE NEGATIVE 11/25/2021 0245   LEUKOCYTESUR TRACE (A) 11/25/2021 0245    Lab Results  Component Value Date   BACTERIA RARE (A) 11/25/2021    Pertinent Imaging: CT stone 8/14 and KUB today: Images reviewed and discussed with the patient  Results for orders placed during the hospital encounter of 11/11/16  DG Abdomen 1 View  Narrative CLINICAL DATA:  RIGHT flank pain, history kidney stones.  EXAM: ABDOMEN - 1 VIEW  COMPARISON:  CT urogram 10/09/2016.  FINDINGS: Normal bowel gas pattern. Small calcifications overlie the RIGHT renal outline consistent with calculi. The LEFT renal outline is obscured by stool. No definite ureteral calculi.  IMPRESSION: Suspected RIGHT nephrolithiasis. If further investigation desired, recommend CT urography.   Electronically Signed By: Staci Righter M.D. On: 11/11/2016 11:56  No results found for this or any previous visit.  No results found for this or any previous visit.  No results found for this or any previous visit.  Results for orders placed during the hospital encounter of 08/30/17  US Renal  Narrative CLINICAL DATA:  Left flank pain x1 week.  EXAM: RENAL / URINARY TRACT ULTRASOUND COMPLETE  COMPARISON:  CT from 01/11/2017  FINDINGS: Right Kidney:  Length: 9.5 cm. Echogenicity within normal limits.  Nonobstructing echogenic calculi are noted the largest measuring approximately 6 mm in the lower pole. No mass or hydronephrosis visualized.  Left Kidney:  Length: 10.7 cm. Echogenicity within normal limits. Multiple nonobstructing renal calculi are identified the largest measuring 6 mm in the upper pole. No mass or hydronephrosis visualized.  Bladder:  Appears normal for degree of bladder distention. Low level echoes within the bladder compatible with debris are identified. Ureteral jets are demonstrated bilaterally suggesting patency of both ureters.  IMPRESSION: 1. Bilateral nephrolithiasis without obstructive uropathy. 2. Minimal sediment or debris within the bladder.   Electronically Signed By: Ashley Royalty M.D. On: 08/30/2017 21:33  No results found for this or any previous visit.  No results found for this or any previous visit.  Results for orders placed during the hospital encounter of 11/25/21  CT RENAL STONE STUDY  Narrative CLINICAL  DATA:  Left flank pain.  EXAM: CT ABDOMEN AND PELVIS WITHOUT CONTRAST  TECHNIQUE: Multidetector CT imaging of the abdomen and pelvis was performed following the standard protocol without IV contrast.  RADIATION DOSE REDUCTION: This exam was performed according to the departmental dose-optimization program which includes automated exposure control, adjustment of the mA and/or kV according to patient size and/or use of iterative reconstruction technique.  COMPARISON:  May 16, 2018  FINDINGS: Lower chest: No acute abnormality.  Hepatobiliary: No focal liver abnormality is seen. No gallstones, gallbladder wall thickening, or biliary dilatation.  Pancreas: Unremarkable. No pancreatic ductal dilatation or surrounding inflammatory changes.  Spleen: Normal in size without focal abnormality.  Adrenals/Urinary Tract: Adrenal glands are unremarkable. Kidneys are normal in size, without focal lesions. A 4 mm nonobstructing  renal calculus is seen within the mid to lower right kidney. 2 mm and 3 mm nonobstructing renal calculi are also seen within the left kidney. A 7 mm obstructing renal calculus is seen within the proximal left ureter, with moderate severity left-sided hydronephrosis and hydroureter. The urinary bladder is poorly distended and subsequently limited in evaluation.  Stomach/Bowel: Stomach is within normal limits. Appendix appears normal. No evidence of bowel wall thickening, distention, or inflammatory changes.  Vascular/Lymphatic: No significant vascular findings are present. No enlarged abdominal or pelvic lymph nodes.  Reproductive: Prostate is unremarkable.  Other: No abdominal wall hernia or abnormality. No abdominopelvic ascites.  Musculoskeletal: No acute or significant osseous findings.  IMPRESSION: 1. 7 mm obstructing renal calculus within the proximal left ureter. 2. Bilateral nonobstructing renal calculi.   Electronically Signed By: Virgina Norfolk M.D. On: 11/25/2021 03:11   Assessment & Plan:    1. NEPHROLITHIASIS, HX OF -We discussed the management of kidney stones. These options include observation, ureteroscopy, shockwave lithotripsy (ESWL) and percutaneous nephrolithotomy (PCNL). We discussed which options are relevant to the patient's stone(s). We discussed the natural history of kidney stones as well as the complications of untreated stones and the impact on quality of life without treatment as well as with each of the above listed treatments. We also discussed the efficacy of each treatment in its ability to clear the stone burden. With any of these management options I discussed the signs and symptoms of infection and the need for emergent treatment should these be experienced. For each option we discussed the ability of each procedure to clear the patient of their stone burden.   For observation I described the risks which include but are not limited to silent  renal damage, life-threatening infection, need for emergent surgery, failure to pass stone and pain.   For ureteroscopy I described the risks which include bleeding, infection, damage to contiguous structures, positioning injury, ureteral stricture, ureteral avulsion, ureteral injury, need for prolonged ureteral stent, inability to perform ureteroscopy, need for an interval procedure, inability to clear stone burden, stent discomfort/pain, heart attack, stroke, pulmonary embolus and the inherent risks with general anesthesia.   For shockwave lithotripsy I described the risks which include arrhythmia, kidney contusion, kidney hemorrhage, need for transfusion, pain, inability to adequately break up stone, inability to pass stone fragments, Steinstrasse, infection associated with obstructing stones, need for alternate surgical procedure, need for repeat shockwave lithotripsy, MI, CVA, PE and the inherent risks with anesthesia/conscious sedation.   For PCNL I described the risks including positioning injury, pneumothorax, hydrothorax, need for chest tube, inability to clear stone burden, renal laceration, arterial venous fistula or malformation, need for embolization of kidney, loss of kidney or renal function,  need for repeat procedure, need for prolonged nephrostomy tube, ureteral avulsion, MI, CVA, PE and the inherent risks of general anesthesia.   - The patient would like to proceed with left ureteroscopic stone extraction - Urinalysis, Routine w reflex microscopic   No follow-ups on file.  Wilkie Aye, MD  [facility] [place]

## 2021-12-04 LAB — URINALYSIS, ROUTINE W REFLEX MICROSCOPIC
Bilirubin, UA: NEGATIVE
Glucose, UA: NEGATIVE
Ketones, UA: NEGATIVE
Leukocytes,UA: NEGATIVE
Nitrite, UA: NEGATIVE
Specific Gravity, UA: >1.03 — ABNORMAL HIGH (ref 1.005–1.030)
Urobilinogen, Ur: 1 mg/dL (ref 0.2–1.0)
pH, UA: 5.5 (ref 5.0–7.5)

## 2021-12-04 LAB — MICROSCOPIC EXAMINATION
Bacteria, UA: NONE SEEN
Epithelial Cells (non renal): NONE SEEN /hpf (ref 0–10)
Renal Epithel, UA: NONE SEEN /hpf
WBC, UA: NONE SEEN /hpf (ref 0–5)

## 2021-12-09 ENCOUNTER — Other Ambulatory Visit: Payer: Self-pay

## 2021-12-09 ENCOUNTER — Encounter (HOSPITAL_COMMUNITY)
Admission: RE | Admit: 2021-12-09 | Discharge: 2021-12-09 | Disposition: A | Discharge status: Home / Self Care | Payer: 59 | Source: Ambulatory Visit | Attending: Urology | Admitting: Urology

## 2021-12-09 ENCOUNTER — Encounter (HOSPITAL_COMMUNITY): Payer: Self-pay

## 2021-12-09 HISTORY — DX: Sleep apnea, unspecified: G47.30

## 2021-12-09 NOTE — Pre-Procedure Instructions (Signed)
Attempted pre-op phone call. Left voicemail for patient to call back. ?

## 2021-12-11 ENCOUNTER — Ambulatory Visit (HOSPITAL_BASED_OUTPATIENT_CLINIC_OR_DEPARTMENT_OTHER): Payer: 59

## 2021-12-11 ENCOUNTER — Ambulatory Visit (HOSPITAL_COMMUNITY)
Admission: RE | Admit: 2021-12-11 | Discharge: 2021-12-11 | Disposition: A | Discharge status: Home / Self Care | Payer: 59 | Attending: Urology | Admitting: Urology

## 2021-12-11 ENCOUNTER — Ambulatory Visit (HOSPITAL_COMMUNITY): Payer: 59

## 2021-12-11 ENCOUNTER — Encounter (HOSPITAL_COMMUNITY): Payer: Self-pay | Admitting: Urology

## 2021-12-11 ENCOUNTER — Encounter (HOSPITAL_COMMUNITY)
Admission: RE | Disposition: A | Discharge status: Home / Self Care | Payer: Self-pay | Source: Home / Self Care | Attending: Urology

## 2021-12-11 DIAGNOSIS — N201 Calculus of ureter: Secondary | ICD-10-CM

## 2021-12-11 DIAGNOSIS — F17210 Nicotine dependence, cigarettes, uncomplicated: Secondary | ICD-10-CM | POA: Insufficient documentation

## 2021-12-11 DIAGNOSIS — F32A Depression, unspecified: Secondary | ICD-10-CM | POA: Insufficient documentation

## 2021-12-11 DIAGNOSIS — N132 Hydronephrosis with renal and ureteral calculous obstruction: Secondary | ICD-10-CM | POA: Diagnosis not present

## 2021-12-11 DIAGNOSIS — N1330 Unspecified hydronephrosis: Secondary | ICD-10-CM

## 2021-12-11 HISTORY — PX: HOLMIUM LASER APPLICATION: SHX5852

## 2021-12-11 HISTORY — PX: CYSTOSCOPY WITH RETROGRADE PYELOGRAM, URETEROSCOPY AND STENT PLACEMENT: SHX5789

## 2021-12-11 HISTORY — DX: Family history of other specified conditions: Z84.89

## 2021-12-11 SURGERY — CYSTOURETEROSCOPY, WITH RETROGRADE PYELOGRAM AND STENT INSERTION
Anesthesia: General | Site: Ureter | Laterality: Left

## 2021-12-11 MED ORDER — DIATRIZOATE MEGLUMINE 30 % UR SOLN
URETHRAL | Status: DC | PRN
  Administered 2021-12-11: 100 mL via URETHRAL

## 2021-12-11 MED ORDER — ONDANSETRON HCL 4 MG/2ML IJ SOLN
INTRAMUSCULAR | Status: AC
  Filled 2021-12-11: qty 2

## 2021-12-11 MED ORDER — OXYCODONE HCL 5 MG/5ML PO SOLN: 5.0000 mg | Freq: Once | ORAL | Status: AC | PRN

## 2021-12-11 MED ORDER — PHENYLEPHRINE HCL (PRESSORS) 10 MG/ML IV SOLN
INTRAVENOUS | Status: DC | PRN
  Administered 2021-12-11: 80 ug via INTRAVENOUS
  Administered 2021-12-11 (×2): 160 ug via INTRAVENOUS
  Administered 2021-12-11 (×3): 80 ug via INTRAVENOUS

## 2021-12-11 MED ORDER — FENTANYL CITRATE (PF) 100 MCG/2ML IJ SOLN
INTRAMUSCULAR | Status: AC
  Filled 2021-12-11: qty 2

## 2021-12-11 MED ORDER — PHENYLEPHRINE 80 MCG/ML (10ML) SYRINGE FOR IV PUSH (FOR BLOOD PRESSURE SUPPORT)
PREFILLED_SYRINGE | INTRAVENOUS | Status: AC
  Filled 2021-12-11: qty 10

## 2021-12-11 MED ORDER — LIDOCAINE HCL (PF) 2 % IJ SOLN
INTRAMUSCULAR | Status: AC
  Filled 2021-12-11: qty 5

## 2021-12-11 MED ORDER — SODIUM CHLORIDE 0.9 % IR SOLN
Status: DC | PRN
  Administered 2021-12-11 (×2): 3000 mL

## 2021-12-11 MED ORDER — HYDROMORPHONE HCL 1 MG/ML IJ SOLN: 0.2500 mg | INTRAMUSCULAR | Status: DC | PRN

## 2021-12-11 MED ORDER — ONDANSETRON HCL 4 MG PO TABS: 4.0000 mg | ORAL_TABLET | Freq: Four times a day (QID) | ORAL | 0 refills | Status: DC | PRN

## 2021-12-11 MED ORDER — DIATRIZOATE MEGLUMINE 30 % UR SOLN
URETHRAL | Status: AC
  Filled 2021-12-11: qty 100

## 2021-12-11 MED ORDER — WATER FOR IRRIGATION, STERILE IR SOLN
Status: DC | PRN
  Administered 2021-12-11: 500 mL

## 2021-12-11 MED ORDER — ACETAMINOPHEN 10 MG/ML IV SOLN: 1000.0000 mg | Freq: Once | INTRAVENOUS | Status: DC | PRN

## 2021-12-11 MED ORDER — TAMSULOSIN HCL 0.4 MG PO CAPS: 0.4000 mg | ORAL_CAPSULE | Freq: Every day | ORAL | 0 refills | Status: DC

## 2021-12-11 MED ORDER — ORAL CARE MOUTH RINSE
15.0000 mL | Freq: Once | OROMUCOSAL | Status: AC
Start: 2021-12-11 — End: 2021-12-11

## 2021-12-11 MED ORDER — OXYCODONE HCL 5 MG PO TABS
5.0000 mg | ORAL_TABLET | Freq: Once | ORAL | Status: AC | PRN
  Administered 2021-12-11: 5 mg via ORAL
  Filled 2021-12-11: qty 1

## 2021-12-11 MED ORDER — EPHEDRINE 5 MG/ML INJ
INTRAVENOUS | Status: AC
  Filled 2021-12-11: qty 10

## 2021-12-11 MED ORDER — ONDANSETRON HCL 4 MG/2ML IJ SOLN
INTRAMUSCULAR | Status: DC | PRN
  Administered 2021-12-11: 4 mg via INTRAVENOUS

## 2021-12-11 MED ORDER — CEFAZOLIN SODIUM-DEXTROSE 2-4 GM/100ML-% IV SOLN
2.0000 g | INTRAVENOUS | Status: AC
  Administered 2021-12-11: 2 g via INTRAVENOUS
  Filled 2021-12-11: qty 100

## 2021-12-11 MED ORDER — PROPOFOL 10 MG/ML IV BOLUS
INTRAVENOUS | Status: DC | PRN
  Administered 2021-12-11: 30 mg via INTRAVENOUS
  Administered 2021-12-11: 200 mg via INTRAVENOUS

## 2021-12-11 MED ORDER — CHLORHEXIDINE GLUCONATE 0.12 % MT SOLN
15.0000 mL | Freq: Once | OROMUCOSAL | Status: AC
Start: 2021-12-11 — End: 2021-12-11
  Administered 2021-12-11: 15 mL via OROMUCOSAL

## 2021-12-11 MED ORDER — LIDOCAINE HCL (PF) 2 % IJ SOLN
INTRAMUSCULAR | Status: DC | PRN
  Administered 2021-12-11: 80 mg via INTRADERMAL

## 2021-12-11 MED ORDER — DEXAMETHASONE SODIUM PHOSPHATE 10 MG/ML IJ SOLN
INTRAMUSCULAR | Status: AC
  Filled 2021-12-11: qty 1

## 2021-12-11 MED ORDER — LACTATED RINGERS IV SOLN: INTRAVENOUS | Status: DC

## 2021-12-11 MED ORDER — FENTANYL CITRATE (PF) 100 MCG/2ML IJ SOLN
INTRAMUSCULAR | Status: DC | PRN
  Administered 2021-12-11: 50 ug via INTRAVENOUS

## 2021-12-11 MED ORDER — OXYCODONE-ACETAMINOPHEN 5-325 MG PO TABS
1.0000 | ORAL_TABLET | ORAL | 0 refills | Status: DC | PRN
Start: 2021-12-11 — End: 2022-02-11

## 2021-12-11 MED ORDER — ONDANSETRON HCL 4 MG/2ML IJ SOLN: 4.0000 mg | Freq: Once | INTRAMUSCULAR | Status: DC | PRN

## 2021-12-11 MED ORDER — DEXAMETHASONE SODIUM PHOSPHATE 10 MG/ML IJ SOLN
INTRAMUSCULAR | Status: DC | PRN
  Administered 2021-12-11: 10 mg via INTRAVENOUS

## 2021-12-11 MED ORDER — PROPOFOL 10 MG/ML IV BOLUS
INTRAVENOUS | Status: AC
  Filled 2021-12-11: qty 20

## 2021-12-11 SURGICAL SUPPLY — 26 items
BAG DRAIN URO TABLE W/ADPT NS (BAG) ×1 IMPLANT
BAG DRN 8 ADPR NS SKTRN CSTL (BAG) ×1
BAG HAMPER (MISCELLANEOUS) ×1 IMPLANT
BASKET LASER NITINOL 1.9FR (BASKET) IMPLANT
BSKT STON RTRVL 120 1.9FR (BASKET) ×1
CATH INTERMIT  6FR 70CM (CATHETERS) ×1 IMPLANT
CLOTH BEACON ORANGE TIMEOUT ST (SAFETY) ×1 IMPLANT
GLOVE BIO SURGEON STRL SZ8 (GLOVE) ×1 IMPLANT
GLOVE BIOGEL PI IND STRL 7.0 (GLOVE) ×2 IMPLANT
GOWN STRL REUS W/TWL LRG LVL3 (GOWN DISPOSABLE) ×1 IMPLANT
GOWN STRL REUS W/TWL XL LVL3 (GOWN DISPOSABLE) ×1 IMPLANT
GUIDEWIRE STR DUAL SENSOR (WIRE) ×1 IMPLANT
GUIDEWIRE STR ZIPWIRE 035X150 (MISCELLANEOUS) ×1 IMPLANT
IV NS IRRIG 3000ML ARTHROMATIC (IV SOLUTION) ×2 IMPLANT
KIT TURNOVER CYSTO (KITS) ×1 IMPLANT
MANIFOLD NEPTUNE II (INSTRUMENTS) ×1 IMPLANT
PACK CYSTO (CUSTOM PROCEDURE TRAY) ×1 IMPLANT
PAD ARMBOARD 7.5X6 YLW CONV (MISCELLANEOUS) ×1 IMPLANT
SHEATH URETERAL 12FRX35CM (MISCELLANEOUS) IMPLANT
STENT URET 6FRX26 CONTOUR (STENTS) IMPLANT
SYR 10ML LL (SYRINGE) ×1 IMPLANT
SYR CONTROL 10ML LL (SYRINGE) ×1 IMPLANT
TOWEL OR 17X26 4PK STRL BLUE (TOWEL DISPOSABLE) ×1 IMPLANT
TRACTIP FLEXIVA PULS ID 200XHI (Laser) IMPLANT
TRACTIP FLEXIVA PULSE ID 200 (Laser) ×1
WATER STERILE IRR 500ML POUR (IV SOLUTION) ×1 IMPLANT

## 2021-12-11 NOTE — Op Note (Signed)
.  Preoperative diagnosis: Left ureteral stone  Postoperative diagnosis: Same  Procedure: 1 cystoscopy 2. Left retrograde pyelography 3.  Intraoperative fluoroscopy, under one hour, with interpretation 4.  Left ureteroscopic stone manipulation with laser lithotripsy 5.  Left 6 x 26 JJ stent placement  Attending: Cleda Mccreedy  Anesthesia: General  Estimated blood loss: None  Drains: Left 6 x 26 JJ ureteral stent with tether  Specimens: stone for analysis  Antibiotics: ancef  Findings: left UPJ calculus. moderate hydronephrosis. No masses/lesions in the bladder. Ureteral orifices in normal anatomic location.  Indications: Patient is a 41 year old male with a history of left UPJ stone and who has failed medical expulsive therapy.  After discussing treatment options, he decided proceed with left ureteroscopic stone manipulation.  Procedure in detail: The patient was brought to the operating room and a brief timeout was done to ensure correct patient, correct procedure, correct site.  General anesthesia was administered patient was placed in dorsal lithotomy position.  Her genitalia was then prepped and draped in usual sterile fashion.  A rigid 22 French cystoscope was passed in the urethra and the bladder.  Bladder was inspected free masses or lesions.  the ureteral orifices were in the normal orthotopic locations.  a 6 french ureteral catheter was then instilled into the left ureteral orifice.  a gentle retrograde was obtained and findings noted above.  we then placed a zip wire through the ureteral catheter and advanced up to the renal pelvis.  we then removed the cystoscope and cannulated the left ureteral orifice with a semirigid ureteroscope.  No stone was found in the ureter. Once we reached the UPJ a sensor wire was advanced in to the renal pelvis. We then removed the ureteroscope and advanced am 11/13 x 36cm access sheath up to the renal pelvis. We then used the flexible ureteroscope  to perform nephroscopy. We encountered the stone at the UPJ.  Using a 242nm laser fiber the stone was fragmented.  the fragments were then removed with a Ngage basket.    once all stone fragments were removed we then removed the access sheath under direct vision and noted no injury to the ureter. We then placed a 6 x 26 double-j ureteral stent over the original zip wire.  We then removed the wire and good coil was noted in the the renal pelvis under fluoroscopy and the bladder under direct vision. the bladder was then drained and this concluded the procedure which was well tolerated by patient.  Complications: None  Condition: Stable, extubated, transferred to PACU  Plan: Patient is to be discharged home as to follow-up in one week. He is to remove his stent in 72 hours by pulling the tether

## 2021-12-11 NOTE — Transfer of Care (Signed)
Immediate Anesthesia Transfer of Care Note  Patient: Gregory Flores  Procedure(s) Performed: CYSTOSCOPY WITH RETROGRADE PYELOGRAM, URETEROSCOPY AND STENT PLACEMENT (Left: Ureter) HOLMIUM LASER APPLICATION (Left: Ureter)  Patient Location: PACU  Anesthesia Type:General  Level of Consciousness: awake, alert  and oriented  Airway & Oxygen Therapy: Patient Spontanous Breathing and Patient connected to face mask oxygen  Post-op Assessment: Report given to RN, Post -op Vital signs reviewed and stable, Patient moving all extremities X 4 and Patient able to stick tongue midline  Post vital signs: Reviewed  Last Vitals:  Vitals Value Taken Time  BP 120/78 12/11/21 1258  Temp 97.9   Pulse 68 12/11/21 1259  Resp 12 12/11/21 1259  SpO2 98 % 12/11/21 1259  Vitals shown include unvalidated device data.  Last Pain:  Vitals:   12/11/21 0955  TempSrc: Oral  PainSc: 7       Patients Stated Pain Goal: 8 (12/11/21 0955)  Complications: No notable events documented.

## 2021-12-11 NOTE — Interval H&P Note (Signed)
History and Physical Interval Note:  12/11/2021 10:56 AM  Gregory Flores  has presented today for surgery, with the diagnosis of left ureteral calculus.  The various methods of treatment have been discussed with the patient and family. After consideration of risks, benefits and other options for treatment, the patient has consented to  Procedure(s): CYSTOSCOPY WITH RETROGRADE PYELOGRAM, URETEROSCOPY AND STENT PLACEMENT (Left) HOLMIUM LASER APPLICATION (Left) as a surgical intervention.  The patient's history has been reviewed, patient examined, no change in status, stable for surgery.  I have reviewed the patient's chart and labs.  Questions were answered to the patient's satisfaction.     Wilkie Aye

## 2021-12-11 NOTE — Anesthesia Preprocedure Evaluation (Signed)
Anesthesia Evaluation  Patient identified by MRN, date of birth, ID band Patient awake    Reviewed: Allergy & Precautions, H&P , NPO status , Patient's Chart, lab work & pertinent test results, reviewed documented beta blocker date and time   Airway Mallampati: II  TM Distance: >3 FB Neck ROM: full    Dental no notable dental hx.    Pulmonary neg pulmonary ROS, Current Smoker and Patient abstained from smoking.,    Pulmonary exam normal breath sounds clear to auscultation       Cardiovascular Exercise Tolerance: Good negative cardio ROS   Rhythm:regular Rate:Normal     Neuro/Psych Seizures -,  PSYCHIATRIC DISORDERS Depression    GI/Hepatic negative GI ROS, Neg liver ROS,   Endo/Other  negative endocrine ROS  Renal/GU negative Renal ROS  negative genitourinary   Musculoskeletal   Abdominal   Peds  Hematology negative hematology ROS (+)   Anesthesia Other Findings   Reproductive/Obstetrics negative OB ROS                             Anesthesia Physical Anesthesia Plan  ASA: 2  Anesthesia Plan: General and General LMA   Post-op Pain Management:    Induction:   PONV Risk Score and Plan: Ondansetron  Airway Management Planned:   Additional Equipment:   Intra-op Plan:   Post-operative Plan:   Informed Consent: I have reviewed the patients History and Physical, chart, labs and discussed the procedure including the risks, benefits and alternatives for the proposed anesthesia with the patient or authorized representative who has indicated his/her understanding and acceptance.     Dental Advisory Given  Plan Discussed with: CRNA  Anesthesia Plan Comments:         Anesthesia Quick Evaluation

## 2021-12-11 NOTE — Anesthesia Procedure Notes (Addendum)
Procedure Name: LMA Insertion Date/Time: 12/11/2021 11:37 AM  Performed by: Cy Blamer, CRNAPre-anesthesia Checklist: Patient identified, Emergency Drugs available, Suction available, Patient being monitored and Timeout performed Patient Re-evaluated:Patient Re-evaluated prior to induction Oxygen Delivery Method: Circle system utilized Preoxygenation: Pre-oxygenation with 100% oxygen Induction Type: IV induction LMA: LMA inserted LMA Size: 4.0 Placement Confirmation: positive ETCO2 and breath sounds checked- equal and bilateral Tube secured with: Tape Dental Injury: Teeth and Oropharynx as per pre-operative assessment  Comments: LMA placement by [facility]

## 2021-12-12 NOTE — Anesthesia Postprocedure Evaluation (Signed)
Anesthesia Post Note  Patient: Trust Leh  Procedure(s) Performed: CYSTOSCOPY WITH RETROGRADE PYELOGRAM, URETEROSCOPY AND STENT PLACEMENT (Left: Ureter) HOLMIUM LASER APPLICATION (Left: Ureter)  Patient location during evaluation: Phase II Anesthesia Type: General Level of consciousness: awake Pain management: pain level controlled Vital Signs Assessment: post-procedure vital signs reviewed and stable Respiratory status: spontaneous breathing and respiratory function stable Cardiovascular status: blood pressure returned to baseline and stable Postop Assessment: no headache and no apparent nausea or vomiting Anesthetic complications: no Comments: Late entry   No notable events documented.   Last Vitals:  Vitals:   12/11/21 1330 12/11/21 1340  BP: (!) 130/90 131/87  Pulse: 77 76  Resp: 15 17  Temp:  36.7 C  SpO2: 98% 99%    Last Pain:  Vitals:   12/11/21 1340  TempSrc: Oral  PainSc: 2                  Windell Norfolk

## 2021-12-17 ENCOUNTER — Ambulatory Visit: Payer: 59 | Admitting: Physician Assistant

## 2021-12-17 VITALS — BP 116/73 | HR 69

## 2021-12-17 DIAGNOSIS — N200 Calculus of kidney: Secondary | ICD-10-CM

## 2021-12-17 DIAGNOSIS — Z87442 Personal history of urinary calculi: Secondary | ICD-10-CM | POA: Diagnosis not present

## 2021-12-17 LAB — URINALYSIS, ROUTINE W REFLEX MICROSCOPIC
Bilirubin, UA: NEGATIVE
Glucose, UA: NEGATIVE
Ketones, UA: NEGATIVE
Nitrite, UA: NEGATIVE
Specific Gravity, UA: >1.03 — ABNORMAL HIGH (ref 1.005–1.030)
Urobilinogen, Ur: 0.2 mg/dL (ref 0.2–1.0)
pH, UA: 5.5 (ref 5.0–7.5)

## 2021-12-17 LAB — MICROSCOPIC EXAMINATION
RBC, Urine: >30 /hpf — AB (ref 0–2)
Renal Epithel, UA: NONE SEEN /hpf

## 2021-12-17 NOTE — Progress Notes (Addendum)
Assessment: 1. Kidney stones - Urinalysis, Routine w reflex microscopic    Plan: Stone prevention again discussed. Maintain adequate hydration. FU in 6 weeks after Renal US. Call or return if pt has concerns prior to next OV.   Chief Complaint: No chief complaint on file.   HPI: Gregory Flores is a 41 y.o. male who presents for post op evaluation of laser litho and stent placement on 12/11/21. Pt did not pull stent. Minimal pain. No stone fragments of note. No gross hematuria. UA=0-5WBC, >30RBC, few bact, nitrite negative.  Portions of the above documentation were copied from a prior visit for review purposes only.  Allergies: No Known Allergies  PMH: Past Medical History:  Diagnosis Date   Arthritis    Bulging lumbar disc    Chronic lumbar pain    Depression    Drug-seeking behavior    Family history of adverse reaction to anesthesia    Kidney stone    Kidney stones    Night terrors, adult    Renal disorder    Sleep apnea     PSH: Past Surgical History:  Procedure Laterality Date   BLADDER STONE REMOVAL     LITHOTRIPSY      SH: Social History   Tobacco Use   Smoking status: Every Day    Packs/day: 0.50    Years: 7.00    Total pack years: 3.50    Types: Cigarettes   Smokeless tobacco: Never  Vaping Use   Vaping Use: Never used  Substance Use Topics   Alcohol use: No   Drug use: No    ROS: All other review of systems were reviewed and are negative except what is noted above in HPI  PE: BP 116/73   Pulse 69  GENERAL APPEARANCE:  Well appearing, well developed, NAD HEENT:  Atraumatic, normocephalic. Wearing sunglasses NECK:  Supple. Trachea midline ABDOMEN:  Soft, non-tender, no masses, no CVAT GU: Stent removed without difficulty with tip intact EXTREMITIES:  Moves all extremities well, BLE edema noted NEUROLOGIC:  Alert and oriented x 3 MENTAL STATUS:  appropriate SKIN:  Warm, dry, and intact   Results: Laboratory Data: Lab Results   Component Value Date   WBC 13.8 (H) 11/25/2021   HGB 15.6 11/25/2021   HCT 44.3 11/25/2021   MCV 88.4 11/25/2021   PLT 270 11/25/2021    Lab Results  Component Value Date   CREATININE 1.47 (H) 11/25/2021    Urinalysis    Component Value Date/Time   COLORURINE AMBER (A) 11/25/2021 0245   APPEARANCEUR Clear 12/03/2021 1103   LABSPEC 1.023 11/25/2021 0245   PHURINE 5.0 11/25/2021 0245   GLUCOSEU Negative 12/03/2021 1103   HGBUR LARGE (A) 11/25/2021 0245   BILIRUBINUR Negative 12/03/2021 1103   KETONESUR NEGATIVE 11/25/2021 0245   PROTEINUR 1+ (A) 12/03/2021 1103   PROTEINUR 30 (A) 11/25/2021 0245   UROBILINOGEN 1.0 01/28/2015 1407   NITRITE Negative 12/03/2021 1103   NITRITE NEGATIVE 11/25/2021 0245   LEUKOCYTESUR Negative 12/03/2021 1103   LEUKOCYTESUR TRACE (A) 11/25/2021 0245    Lab Results  Component Value Date   LABMICR See below: 12/03/2021   WBCUA None seen 12/03/2021   LABEPIT None seen 12/03/2021   MUCUS Present 12/03/2021   BACTERIA None seen 12/03/2021    Pertinent Imaging: Results for orders placed in visit on 12/03/21  DG Abd 1 View  Narrative CLINICAL DATA:  Left-sided kidney stone.  EXAM: ABDOMEN - 1 VIEW  COMPARISON:  CT scan of the abdomen  and pelvis November 25, 2021. KUB November 11, 2016.  FINDINGS: There is a 4 mm stone in the lower pole of the right kidney. No right ureteral stones.  The left kidney is obscured by bowel contents but no left renal stones are noted. There is a stone or stones in the mid left kidney at the level of the L3 transverse process measuring up to 16 mm in cranial caudal dimension.  No other abnormalities.  IMPRESSION: Left mid ureteral stone/stones measuring up to 16 mm in length.  4 mm stone in the right kidney.   Electronically Signed By: Gerome Sam III M.D. On: 12/03/2021 12:10  No results found for this or any previous visit.  No results found for this or any previous visit.  No results  found for this or any previous visit.  Results for orders placed during the hospital encounter of 08/30/17  US Renal  Narrative CLINICAL DATA:  Left flank pain x1 week.  EXAM: RENAL / URINARY TRACT ULTRASOUND COMPLETE  COMPARISON:  CT from 01/11/2017  FINDINGS: Right Kidney:  Length: 9.5 cm. Echogenicity within normal limits. Nonobstructing echogenic calculi are noted the largest measuring approximately 6 mm in the lower pole. No mass or hydronephrosis visualized.  Left Kidney:  Length: 10.7 cm. Echogenicity within normal limits. Multiple nonobstructing renal calculi are identified the largest measuring 6 mm in the upper pole. No mass or hydronephrosis visualized.  Bladder:  Appears normal for degree of bladder distention. Low level echoes within the bladder compatible with debris are identified. Ureteral jets are demonstrated bilaterally suggesting patency of both ureters.  IMPRESSION: 1. Bilateral nephrolithiasis without obstructive uropathy. 2. Minimal sediment or debris within the bladder.   Electronically Signed By: Tollie Eth M.D. On: 08/30/2017 21:33  No results found for this or any previous visit.  No results found for this or any previous visit.  Results for orders placed during the hospital encounter of 11/25/21  CT RENAL STONE STUDY  Narrative CLINICAL DATA:  Left flank pain.  EXAM: CT ABDOMEN AND PELVIS WITHOUT CONTRAST  TECHNIQUE: Multidetector CT imaging of the abdomen and pelvis was performed following the standard protocol without IV contrast.  RADIATION DOSE REDUCTION: This exam was performed according to the departmental dose-optimization program which includes automated exposure control, adjustment of the mA and/or kV according to patient size and/or use of iterative reconstruction technique.  COMPARISON:  May 16, 2018  FINDINGS: Lower chest: No acute abnormality.  Hepatobiliary: No focal liver abnormality is seen. No  gallstones, gallbladder wall thickening, or biliary dilatation.  Pancreas: Unremarkable. No pancreatic ductal dilatation or surrounding inflammatory changes.  Spleen: Normal in size without focal abnormality.  Adrenals/Urinary Tract: Adrenal glands are unremarkable. Kidneys are normal in size, without focal lesions. A 4 mm nonobstructing renal calculus is seen within the mid to lower right kidney. 2 mm and 3 mm nonobstructing renal calculi are also seen within the left kidney. A 7 mm obstructing renal calculus is seen within the proximal left ureter, with moderate severity left-sided hydronephrosis and hydroureter. The urinary bladder is poorly distended and subsequently limited in evaluation.  Stomach/Bowel: Stomach is within normal limits. Appendix appears normal. No evidence of bowel wall thickening, distention, or inflammatory changes.  Vascular/Lymphatic: No significant vascular findings are present. No enlarged abdominal or pelvic lymph nodes.  Reproductive: Prostate is unremarkable.  Other: No abdominal wall hernia or abnormality. No abdominopelvic ascites.  Musculoskeletal: No acute or significant osseous findings.  IMPRESSION: 1. 7 mm obstructing renal calculus  within the proximal left ureter. 2. Bilateral nonobstructing renal calculi.   Electronically Signed By: Aram Candela M.D. On: 11/25/2021 03:11  No results found for this or any previous visit (from the past 24 hour(s)).

## 2021-12-18 ENCOUNTER — Encounter (HOSPITAL_COMMUNITY): Payer: Self-pay | Admitting: Urology

## 2021-12-22 LAB — CALCULI, WITH PHOTOGRAPH (CLINICAL LAB)
Calcium Oxalate Dihydrate: 30 %
Calcium Oxalate Monohydrate: 70 %
Weight Calculi: 154 mg

## 2022-01-21 ENCOUNTER — Ambulatory Visit (HOSPITAL_COMMUNITY)
Admission: RE | Admit: 2022-01-21 | Discharge: 2022-01-21 | Disposition: A | Discharge status: Home / Self Care | Payer: 59 | Source: Ambulatory Visit | Attending: Physician Assistant | Admitting: Physician Assistant

## 2022-01-21 DIAGNOSIS — N200 Calculus of kidney: Secondary | ICD-10-CM | POA: Diagnosis not present

## 2022-01-31 ENCOUNTER — Encounter: Payer: Self-pay | Admitting: Urology

## 2022-01-31 DIAGNOSIS — Z87442 Personal history of urinary calculi: Secondary | ICD-10-CM

## 2022-02-02 ENCOUNTER — Telehealth: Payer: Self-pay

## 2022-02-02 NOTE — Telephone Encounter (Signed)
Pt needing to ask results from recent US done recently.   Call back [phone]  Please advise.  Thanks, Helene Kelp

## 2022-02-03 NOTE — Telephone Encounter (Signed)
Please see below and advise, no f/u scheduled.

## 2022-02-04 NOTE — Telephone Encounter (Signed)
Patient states he wanted to see Dr. Alyson Ingles sooner than a 6 month f/u to discuss surgery.  Patient scheduled with Dr. Alyson Ingles on 02/11/2022

## 2022-02-11 ENCOUNTER — Ambulatory Visit: Payer: 59 | Admitting: Urology

## 2022-02-11 ENCOUNTER — Encounter: Payer: Self-pay | Admitting: Urology

## 2022-02-11 ENCOUNTER — Ambulatory Visit (HOSPITAL_COMMUNITY)
Admission: RE | Admit: 2022-02-11 | Discharge: 2022-02-11 | Disposition: A | Discharge status: Home / Self Care | Payer: 59 | Source: Ambulatory Visit | Attending: Urology | Admitting: Urology

## 2022-02-11 VITALS — BP 128/84 | HR 81

## 2022-02-11 DIAGNOSIS — R3915 Urgency of urination: Secondary | ICD-10-CM | POA: Diagnosis not present

## 2022-02-11 DIAGNOSIS — Z87442 Personal history of urinary calculi: Secondary | ICD-10-CM | POA: Insufficient documentation

## 2022-02-11 DIAGNOSIS — N200 Calculus of kidney: Secondary | ICD-10-CM | POA: Diagnosis not present

## 2022-02-11 MED ORDER — OXYCODONE-ACETAMINOPHEN 5-325 MG PO TABS: 1.0000 | ORAL_TABLET | ORAL | 0 refills | Status: AC | PRN

## 2022-02-11 MED ORDER — ONDANSETRON HCL 4 MG PO TABS: 4.0000 mg | ORAL_TABLET | Freq: Four times a day (QID) | ORAL | 0 refills | Status: DC | PRN

## 2022-02-11 MED ORDER — TAMSULOSIN HCL 0.4 MG PO CAPS: 0.4000 mg | ORAL_CAPSULE | Freq: Every day | ORAL | 0 refills | Status: DC

## 2022-02-11 NOTE — Progress Notes (Signed)
02/11/2022 1:25 PM   Gregory Flores [birth year] [id number]  Referring provider: Harlan Stains, MD [place],  [place] [address]  Followup nephrolithiasis   HPI: Gregory Flores is a 41yo here for followup for nephrolithiasis. Renal US from 10/12 shows possible left 47mm calculus and KUB today shows no calculus. He has intermittent left flank pain. He has occasional right flank pain. Marland Kitchen    PMH: Past Medical History:  Diagnosis Date   Arthritis    Bulging lumbar disc    Chronic lumbar pain    Depression    Drug-seeking behavior    Family history of adverse reaction to anesthesia    Kidney stone    Kidney stones    Night terrors, adult    Renal disorder    Sleep apnea     Surgical History: Past Surgical History:  Procedure Laterality Date   BLADDER STONE REMOVAL     CYSTOSCOPY WITH RETROGRADE PYELOGRAM, URETEROSCOPY AND STENT PLACEMENT Left 12/11/2021   Procedure: CYSTOSCOPY WITH RETROGRADE PYELOGRAM, URETEROSCOPY AND STENT PLACEMENT;  Surgeon: Cleon Gustin, MD;  Location: [facility];  Service: Urology;  Laterality: Left;   HOLMIUM LASER APPLICATION Left 7/62/2633   Procedure: HOLMIUM LASER APPLICATION;  Surgeon: Cleon Gustin, MD;  Location: [facility];  Service: Urology;  Laterality: Left;   LITHOTRIPSY      Home Medications:  Allergies as of 02/11/2022   No Known Allergies      Medication List        Accurate as of February 11, 2022  1:25 PM. If you have any questions, ask your nurse or doctor.          acetaminophen 325 MG tablet Commonly known as: Tylenol Take 2 tablets (650 mg total) by mouth every 6 (six) hours as needed.   Ajovy 225 MG/1.5ML Soaj Generic drug: Fremanezumab-vfrm Inject into the skin.   buPROPion ER 100 MG 12 hr tablet Commonly known as: WELLBUTRIN SR Take 100 mg by mouth 2 (two) times daily.   DULoxetine 30 MG capsule Commonly known as: CYMBALTA Take 30 mg by mouth 2 (two) times daily.   gabapentin  600 MG tablet Commonly known as: NEURONTIN Take 600 mg by mouth 3 (three) times daily.   gemfibrozil 600 MG tablet Commonly known as: LOPID Take 600 mg by mouth 2 (two) times daily.   ibuprofen 600 MG tablet Commonly known as: ADVIL Take 1 tablet (600 mg total) by mouth every 6 (six) hours as needed.   methylphenidate 20 MG tablet Commonly known as: RITALIN Take 20 mg by mouth 2 (two) times daily.   ondansetron 4 MG tablet Commonly known as: ZOFRAN Take 1 tablet (4 mg total) by mouth every 6 (six) hours as needed for nausea or vomiting.   oxyCODONE 5 MG immediate release tablet Commonly known as: Roxicodone Take 1 tablet (5 mg total) by mouth every 4 (four) hours as needed for severe pain.   oxyCODONE-acetaminophen 5-325 MG tablet Commonly known as: PERCOCET/ROXICET Take 1 tablet by mouth every 4 (four) hours as needed for severe pain.   propranolol 80 MG tablet Commonly known as: INDERAL Take 80 mg by mouth daily.   rizatriptan 10 MG tablet Commonly known as: MAXALT Take 10 mg by mouth as needed for migraine.   tamsulosin 0.4 MG Caps capsule Commonly known as: FLOMAX Take 1 capsule (0.4 mg total) by mouth daily after breakfast.        Allergies: No Known Allergies  Family  History: No family history on file.  Social History:  reports that he has been smoking cigarettes. He has a 3.50 pack-year smoking history. He has never used smokeless tobacco. He reports that he does not drink alcohol and does not use drugs.  ROS: All other review of systems were reviewed and are negative except what is noted above in HPI  Physical Exam: BP 128/84   Pulse 81   Constitutional:  Alert and oriented, No acute distress. HEENT: [place] AT, moist mucus membranes.  Trachea midline, no masses. Cardiovascular: No clubbing, cyanosis, or edema. Respiratory: Normal respiratory effort, no increased work of breathing. GI: Abdomen is soft, nontender, nondistended, no abdominal masses GU:  No CVA tenderness.  Lymph: No cervical or inguinal lymphadenopathy. Skin: No rashes, bruises or suspicious lesions. Neurologic: Grossly intact, no focal deficits, moving all 4 extremities. Psychiatric: Normal mood and affect.  Laboratory Data: Lab Results  Component Value Date   WBC 13.8 (H) 11/25/2021   HGB 15.6 11/25/2021   HCT 44.3 11/25/2021   MCV 88.4 11/25/2021   PLT 270 11/25/2021    Lab Results  Component Value Date   CREATININE 1.47 (H) 11/25/2021    No results found for: "PSA"  No results found for: "TESTOSTERONE"  No results found for: "HGBA1C"  Urinalysis    Component Value Date/Time   COLORURINE AMBER (A) 11/25/2021 0245   APPEARANCEUR Clear 12/17/2021 1434   LABSPEC 1.023 11/25/2021 0245   PHURINE 5.0 11/25/2021 0245   GLUCOSEU Negative 12/17/2021 1434   HGBUR LARGE (A) 11/25/2021 0245   BILIRUBINUR Negative 12/17/2021 1434   KETONESUR NEGATIVE 11/25/2021 0245   PROTEINUR 3+ (A) 12/17/2021 1434   PROTEINUR 30 (A) 11/25/2021 0245   UROBILINOGEN 1.0 01/28/2015 1407   NITRITE Negative 12/17/2021 1434   NITRITE NEGATIVE 11/25/2021 0245   LEUKOCYTESUR 1+ (A) 12/17/2021 1434   LEUKOCYTESUR TRACE (A) 11/25/2021 0245    Lab Results  Component Value Date   LABMICR See below: 12/17/2021   WBCUA 6-10 (A) 12/17/2021   LABEPIT 0-10 12/17/2021   MUCUS Present 12/17/2021   BACTERIA Few 12/17/2021    Pertinent Imaging: Renal US 01/22/2022 and KUb today: Images reviewed and discussed with the patient  Results for orders placed in visit on 12/03/21  DG Abd 1 View  Narrative CLINICAL DATA:  Left-sided kidney stone.  EXAM: ABDOMEN - 1 VIEW  COMPARISON:  CT scan of the abdomen and pelvis November 25, 2021. KUB November 11, 2016.  FINDINGS: There is a 4 mm stone in the lower pole of the right kidney. No right ureteral stones.  The left kidney is obscured by bowel contents but no left renal stones are noted. There is a stone or stones in the mid left  kidney at the level of the L3 transverse process measuring up to 16 mm in cranial caudal dimension.  No other abnormalities.  IMPRESSION: Left mid ureteral stone/stones measuring up to 16 mm in length.  4 mm stone in the right kidney.   Electronically Signed By: David  Williams III M.D. On: 12/03/2021 12:10  No results found for this or any previous visit.  No results found for this or any previous visit.  No results found for this or any previous visit.  Results for orders placed during the hospital encounter of 01/21/22  Ultrasound renal complete  Narrative CLINICAL DATA:  Nephrolithiasis follow-up.  EXAM: RENAL / URINARY TRACT ULTRASOUND COMPLETE  COMPARISON:  CT renal stone November 25, 2021  FINDINGS: Right Kidney:    Renal measurements: 8.3 x 5.4 x 5.9 cm = volume: 139.1 mL. Echogenicity within normal limits. No mass or hydronephrosis visualized.  Left Kidney:  Renal measurements: 9.9 x 6.4 x 5.5 cm = volume: 181.9 mL. Echogenicity within normal limits. No mass or hydronephrosis visualized. Note is made of a 7 mm stone within the inferior pole.  Bladder:  Appears normal for degree of bladder distention.  Other:  None.  IMPRESSION: 1. No hydronephrosis. 2. 7 mm stone within the inferior pole of the left kidney.   Electronically Signed By: Annia Belt M.D. On: 01/22/2022 06:08  No valid procedures specified. No results found for this or any previous visit.  Results for orders placed during the hospital encounter of 11/25/21  CT RENAL STONE STUDY  Narrative CLINICAL DATA:  Left flank pain.  EXAM: CT ABDOMEN AND PELVIS WITHOUT CONTRAST  TECHNIQUE: Multidetector CT imaging of the abdomen and pelvis was performed following the standard protocol without IV contrast.  RADIATION DOSE REDUCTION: This exam was performed according to the departmental dose-optimization program which includes automated exposure control, adjustment of the mA  and/or kV according to patient size and/or use of iterative reconstruction technique.  COMPARISON:  May 16, 2018  FINDINGS: Lower chest: No acute abnormality.  Hepatobiliary: No focal liver abnormality is seen. No gallstones, gallbladder wall thickening, or biliary dilatation.  Pancreas: Unremarkable. No pancreatic ductal dilatation or surrounding inflammatory changes.  Spleen: Normal in size without focal abnormality.  Adrenals/Urinary Tract: Adrenal glands are unremarkable. Kidneys are normal in size, without focal lesions. A 4 mm nonobstructing renal calculus is seen within the mid to lower right kidney. 2 mm and 3 mm nonobstructing renal calculi are also seen within the left kidney. A 7 mm obstructing renal calculus is seen within the proximal left ureter, with moderate severity left-sided hydronephrosis and hydroureter. The urinary bladder is poorly distended and subsequently limited in evaluation.  Stomach/Bowel: Stomach is within normal limits. Appendix appears normal. No evidence of bowel wall thickening, distention, or inflammatory changes.  Vascular/Lymphatic: No significant vascular findings are present. No enlarged abdominal or pelvic lymph nodes.  Reproductive: Prostate is unremarkable.  Other: No abdominal wall hernia or abnormality. No abdominopelvic ascites.  Musculoskeletal: No acute or significant osseous findings.  IMPRESSION: 1. 7 mm obstructing renal calculus within the proximal left ureter. 2. Bilateral nonobstructing renal calculi.   Electronically Signed By: Aram Candela M.D. On: 11/25/2021 03:11   Assessment & Plan:    1. Left renal calculus -We discussed the management of kidney stones. These options include observation, ureteroscopy, shockwave lithotripsy (ESWL) and percutaneous nephrolithotomy (PCNL). We discussed which options are relevant to the patient's stone(s). We discussed the natural history of kidney stones as well as  the complications of untreated stones and the impact on quality of life without treatment as well as with each of the above listed treatments. We also discussed the efficacy of each treatment in its ability to clear the stone burden. With any of these management options I discussed the signs and symptoms of infection and the need for emergent treatment should these be experienced. For each option we discussed the ability of each procedure to clear the patient of their stone burden.   For observation I described the risks which include but are not limited to silent renal damage, life-threatening infection, need for emergent surgery, failure to pass stone and pain.   For ureteroscopy I described the risks which include bleeding, infection, damage to contiguous structures, positioning injury, ureteral stricture,  ureteral avulsion, ureteral injury, need for prolonged ureteral stent, inability to perform ureteroscopy, need for an interval procedure, inability to clear stone burden, stent discomfort/pain, heart attack, stroke, pulmonary embolus and the inherent risks with general anesthesia.   For shockwave lithotripsy I described the risks which include arrhythmia, kidney contusion, kidney hemorrhage, need for transfusion, pain, inability to adequately break up stone, inability to pass stone fragments, Steinstrasse, infection associated with obstructing stones, need for alternate surgical procedure, need for repeat shockwave lithotripsy, MI, CVA, PE and the inherent risks with anesthesia/conscious sedation.   For PCNL I described the risks including positioning injury, pneumothorax, hydrothorax, need for chest tube, inability to clear stone burden, renal laceration, arterial venous fistula or malformation, need for embolization of kidney, loss of kidney or renal function, need for repeat procedure, need for prolonged nephrostomy tube, ureteral avulsion, MI, CVA, PE and the inherent risks of general anesthesia.    - The patient would like to proceed with left ureteroscopic stone extraction. - Urinalysis, Routine w reflex microscopic    No follow-ups on file.  Wilkie Aye, MD  [facility] [place]

## 2022-02-11 NOTE — Patient Instructions (Signed)

## 2022-02-11 NOTE — H&P (View-Only) (Signed)
02/11/2022 1:25 PM   Gregory Flores [id number] [id number]  Referring provider: Harlan Stains, MD [place],  [place] [address]  Followup nephrolithiasis   HPI: Gregory Flores is a 41yo here for followup for nephrolithiasis. Renal US from 10/12 shows possible left 47mm calculus and KUB today shows no calculus. He has intermittent left flank pain. He has occasional right flank pain. Marland Kitchen    PMH: Past Medical History:  Diagnosis Date   Arthritis    Bulging lumbar disc    Chronic lumbar pain    Depression    Drug-seeking behavior    Family history of adverse reaction to anesthesia    Kidney stone    Kidney stones    Night terrors, adult    Renal disorder    Sleep apnea     Surgical History: Past Surgical History:  Procedure Laterality Date   BLADDER STONE REMOVAL     CYSTOSCOPY WITH RETROGRADE PYELOGRAM, URETEROSCOPY AND STENT PLACEMENT Left 12/11/2021   Procedure: CYSTOSCOPY WITH RETROGRADE PYELOGRAM, URETEROSCOPY AND STENT PLACEMENT;  Surgeon: Cleon Gustin, MD;  Location: [facility];  Service: Urology;  Laterality: Left;   HOLMIUM LASER APPLICATION Left 7/62/2633   Procedure: HOLMIUM LASER APPLICATION;  Surgeon: Cleon Gustin, MD;  Location: [facility];  Service: Urology;  Laterality: Left;   LITHOTRIPSY      Home Medications:  Allergies as of 02/11/2022   No Known Allergies      Medication List        Accurate as of February 11, 2022  1:25 PM. If you have any questions, ask your nurse or doctor.          acetaminophen 325 MG tablet Commonly known as: Tylenol Take 2 tablets (650 mg total) by mouth every 6 (six) hours as needed.   Ajovy 225 MG/1.5ML Soaj Generic drug: Fremanezumab-vfrm Inject into the skin.   buPROPion ER 100 MG 12 hr tablet Commonly known as: WELLBUTRIN SR Take 100 mg by mouth 2 (two) times daily.   DULoxetine 30 MG capsule Commonly known as: CYMBALTA Take 30 mg by mouth 2 (two) times daily.   gabapentin  600 MG tablet Commonly known as: NEURONTIN Take 600 mg by mouth 3 (three) times daily.   gemfibrozil 600 MG tablet Commonly known as: LOPID Take 600 mg by mouth 2 (two) times daily.   ibuprofen 600 MG tablet Commonly known as: ADVIL Take 1 tablet (600 mg total) by mouth every 6 (six) hours as needed.   methylphenidate 20 MG tablet Commonly known as: RITALIN Take 20 mg by mouth 2 (two) times daily.   ondansetron 4 MG tablet Commonly known as: ZOFRAN Take 1 tablet (4 mg total) by mouth every 6 (six) hours as needed for nausea or vomiting.   oxyCODONE 5 MG immediate release tablet Commonly known as: Roxicodone Take 1 tablet (5 mg total) by mouth every 4 (four) hours as needed for severe pain.   oxyCODONE-acetaminophen 5-325 MG tablet Commonly known as: PERCOCET/ROXICET Take 1 tablet by mouth every 4 (four) hours as needed for severe pain.   propranolol 80 MG tablet Commonly known as: INDERAL Take 80 mg by mouth daily.   rizatriptan 10 MG tablet Commonly known as: MAXALT Take 10 mg by mouth as needed for migraine.   tamsulosin 0.4 MG Caps capsule Commonly known as: FLOMAX Take 1 capsule (0.4 mg total) by mouth daily after breakfast.        Allergies: No Known Allergies  Family  History: No family history on file.  Social History:  reports that he has been smoking cigarettes. He has a 3.50 pack-year smoking history. He has never used smokeless tobacco. He reports that he does not drink alcohol and does not use drugs.  ROS: All other review of systems were reviewed and are negative except what is noted above in HPI  Physical Exam: BP 128/84   Pulse 81   Constitutional:  Alert and oriented, No acute distress. HEENT: [place] AT, moist mucus membranes.  Trachea midline, no masses. Cardiovascular: No clubbing, cyanosis, or edema. Respiratory: Normal respiratory effort, no increased work of breathing. GI: Abdomen is soft, nontender, nondistended, no abdominal masses GU:  No CVA tenderness.  Lymph: No cervical or inguinal lymphadenopathy. Skin: No rashes, bruises or suspicious lesions. Neurologic: Grossly intact, no focal deficits, moving all 4 extremities. Psychiatric: Normal mood and affect.  Laboratory Data: Lab Results  Component Value Date   WBC 13.8 (H) 11/25/2021   HGB 15.6 11/25/2021   HCT 44.3 11/25/2021   MCV 88.4 11/25/2021   PLT 270 11/25/2021    Lab Results  Component Value Date   CREATININE 1.47 (H) 11/25/2021    No results found for: "PSA"  No results found for: "TESTOSTERONE"  No results found for: "HGBA1C"  Urinalysis    Component Value Date/Time   COLORURINE AMBER (A) 11/25/2021 0245   APPEARANCEUR Clear 12/17/2021 1434   LABSPEC 1.023 11/25/2021 0245   PHURINE 5.0 11/25/2021 0245   GLUCOSEU Negative 12/17/2021 1434   HGBUR LARGE (A) 11/25/2021 0245   BILIRUBINUR Negative 12/17/2021 1434   KETONESUR NEGATIVE 11/25/2021 0245   PROTEINUR 3+ (A) 12/17/2021 1434   PROTEINUR 30 (A) 11/25/2021 0245   UROBILINOGEN 1.0 01/28/2015 1407   NITRITE Negative 12/17/2021 1434   NITRITE NEGATIVE 11/25/2021 0245   LEUKOCYTESUR 1+ (A) 12/17/2021 1434   LEUKOCYTESUR TRACE (A) 11/25/2021 0245    Lab Results  Component Value Date   LABMICR See below: 12/17/2021   WBCUA 6-10 (A) 12/17/2021   LABEPIT 0-10 12/17/2021   MUCUS Present 12/17/2021   BACTERIA Few 12/17/2021    Pertinent Imaging: Renal US 01/22/2022 and KUb today: Images reviewed and discussed with the patient  Results for orders placed in visit on 12/03/21  DG Abd 1 View  Narrative CLINICAL DATA:  Left-sided kidney stone.  EXAM: ABDOMEN - 1 VIEW  COMPARISON:  CT scan of the abdomen and pelvis November 25, 2021. KUB November 11, 2016.  FINDINGS: There is a 4 mm stone in the lower pole of the right kidney. No right ureteral stones.  The left kidney is obscured by bowel contents but no left renal stones are noted. There is a stone or stones in the mid left  kidney at the level of the L3 transverse process measuring up to 16 mm in cranial caudal dimension.  No other abnormalities.  IMPRESSION: Left mid ureteral stone/stones measuring up to 16 mm in length.  4 mm stone in the right kidney.   Electronically Signed By: Gerome Sam III M.D. On: 12/03/2021 12:10  No results found for this or any previous visit.  No results found for this or any previous visit.  No results found for this or any previous visit.  Results for orders placed during the hospital encounter of 01/21/22  Ultrasound renal complete  Narrative CLINICAL DATA:  Nephrolithiasis follow-up.  EXAM: RENAL / URINARY TRACT ULTRASOUND COMPLETE  COMPARISON:  CT renal stone November 25, 2021  FINDINGS: Right Kidney:  Renal measurements: 8.3 x 5.4 x 5.9 cm = volume: 139.1 mL. Echogenicity within normal limits. No mass or hydronephrosis visualized.  Left Kidney:  Renal measurements: 9.9 x 6.4 x 5.5 cm = volume: 181.9 mL. Echogenicity within normal limits. No mass or hydronephrosis visualized. Note is made of a 7 mm stone within the inferior pole.  Bladder:  Appears normal for degree of bladder distention.  Other:  None.  IMPRESSION: 1. No hydronephrosis. 2. 7 mm stone within the inferior pole of the left kidney.   Electronically Signed By: Annia Belt M.D. On: 01/22/2022 06:08  No valid procedures specified. No results found for this or any previous visit.  Results for orders placed during the hospital encounter of 11/25/21  CT RENAL STONE STUDY  Narrative CLINICAL DATA:  Left flank pain.  EXAM: CT ABDOMEN AND PELVIS WITHOUT CONTRAST  TECHNIQUE: Multidetector CT imaging of the abdomen and pelvis was performed following the standard protocol without IV contrast.  RADIATION DOSE REDUCTION: This exam was performed according to the departmental dose-optimization program which includes automated exposure control, adjustment of the mA  and/or kV according to patient size and/or use of iterative reconstruction technique.  COMPARISON:  May 16, 2018  FINDINGS: Lower chest: No acute abnormality.  Hepatobiliary: No focal liver abnormality is seen. No gallstones, gallbladder wall thickening, or biliary dilatation.  Pancreas: Unremarkable. No pancreatic ductal dilatation or surrounding inflammatory changes.  Spleen: Normal in size without focal abnormality.  Adrenals/Urinary Tract: Adrenal glands are unremarkable. Kidneys are normal in size, without focal lesions. A 4 mm nonobstructing renal calculus is seen within the mid to lower right kidney. 2 mm and 3 mm nonobstructing renal calculi are also seen within the left kidney. A 7 mm obstructing renal calculus is seen within the proximal left ureter, with moderate severity left-sided hydronephrosis and hydroureter. The urinary bladder is poorly distended and subsequently limited in evaluation.  Stomach/Bowel: Stomach is within normal limits. Appendix appears normal. No evidence of bowel wall thickening, distention, or inflammatory changes.  Vascular/Lymphatic: No significant vascular findings are present. No enlarged abdominal or pelvic lymph nodes.  Reproductive: Prostate is unremarkable.  Other: No abdominal wall hernia or abnormality. No abdominopelvic ascites.  Musculoskeletal: No acute or significant osseous findings.  IMPRESSION: 1. 7 mm obstructing renal calculus within the proximal left ureter. 2. Bilateral nonobstructing renal calculi.   Electronically Signed By: Aram Candela M.D. On: 11/25/2021 03:11   Assessment & Plan:    1. Left renal calculus -We discussed the management of kidney stones. These options include observation, ureteroscopy, shockwave lithotripsy (ESWL) and percutaneous nephrolithotomy (PCNL). We discussed which options are relevant to the patient's stone(s). We discussed the natural history of kidney stones as well as  the complications of untreated stones and the impact on quality of life without treatment as well as with each of the above listed treatments. We also discussed the efficacy of each treatment in its ability to clear the stone burden. With any of these management options I discussed the signs and symptoms of infection and the need for emergent treatment should these be experienced. For each option we discussed the ability of each procedure to clear the patient of their stone burden.   For observation I described the risks which include but are not limited to silent renal damage, life-threatening infection, need for emergent surgery, failure to pass stone and pain.   For ureteroscopy I described the risks which include bleeding, infection, damage to contiguous structures, positioning injury, ureteral stricture,  ureteral avulsion, ureteral injury, need for prolonged ureteral stent, inability to perform ureteroscopy, need for an interval procedure, inability to clear stone burden, stent discomfort/pain, heart attack, stroke, pulmonary embolus and the inherent risks with general anesthesia.   For shockwave lithotripsy I described the risks which include arrhythmia, kidney contusion, kidney hemorrhage, need for transfusion, pain, inability to adequately break up stone, inability to pass stone fragments, Steinstrasse, infection associated with obstructing stones, need for alternate surgical procedure, need for repeat shockwave lithotripsy, MI, CVA, PE and the inherent risks with anesthesia/conscious sedation.   For PCNL I described the risks including positioning injury, pneumothorax, hydrothorax, need for chest tube, inability to clear stone burden, renal laceration, arterial venous fistula or malformation, need for embolization of kidney, loss of kidney or renal function, need for repeat procedure, need for prolonged nephrostomy tube, ureteral avulsion, MI, CVA, PE and the inherent risks of general anesthesia.    - The patient would like to proceed with left ureteroscopic stone extraction. - Urinalysis, Routine w reflex microscopic    No follow-ups on file.  Wilkie Aye, MD  [facility] [place]

## 2022-02-19 LAB — MICROSCOPIC EXAMINATION: Epithelial Cells (non renal): NONE SEEN /hpf (ref 0–10)

## 2022-02-19 LAB — URINALYSIS, ROUTINE W REFLEX MICROSCOPIC
Bilirubin, UA: NEGATIVE
Glucose, UA: NEGATIVE
Leukocytes,UA: NEGATIVE
Nitrite, UA: NEGATIVE
Specific Gravity, UA: >1.03 — ABNORMAL HIGH (ref 1.005–1.030)
Urobilinogen, Ur: 1 mg/dL (ref 0.2–1.0)
pH, UA: 6 (ref 5.0–7.5)

## 2022-03-02 ENCOUNTER — Other Ambulatory Visit: Payer: Self-pay | Admitting: Urology

## 2022-03-10 ENCOUNTER — Encounter (HOSPITAL_COMMUNITY)
Admission: RE | Admit: 2022-03-10 | Discharge: 2022-03-10 | Disposition: A | Discharge status: Home / Self Care | Payer: 59 | Source: Ambulatory Visit | Attending: Urology | Admitting: Urology

## 2022-03-10 ENCOUNTER — Encounter (HOSPITAL_COMMUNITY): Payer: Self-pay

## 2022-03-10 VITALS — BP 138/98 | HR 79 | Temp 98.1°F | Resp 18 | Ht 67.0 in | Wt 192.0 lb

## 2022-03-10 DIAGNOSIS — Z01810 Encounter for preprocedural cardiovascular examination: Secondary | ICD-10-CM | POA: Diagnosis present

## 2022-03-10 DIAGNOSIS — F17200 Nicotine dependence, unspecified, uncomplicated: Secondary | ICD-10-CM | POA: Insufficient documentation

## 2022-03-10 HISTORY — DX: Anxiety disorder, unspecified: F41.9

## 2022-03-10 HISTORY — DX: Personal history of urinary calculi: Z87.442

## 2022-03-10 NOTE — Patient Instructions (Signed)
Gregory Flores  03/10/2022     @PREFPERIOPPHARMACY @   Your procedure is scheduled on  03/12/2022.   Report to 03/14/2022 at  0800 A.M.   Call this number if you have problems the morning of surgery:  [phone]  If you experience any cold or flu symptoms such as cough, fever, chills, shortness of breath, etc. between now and your scheduled surgery, please notify [phone] at the above number.   Remember:  Do not eat or drink after midnight.      Take these medicines the morning of surgery with A SIP OF WATER         wellbutrin, cymbalta, gabapentin, zofran (if needed), oxycodone (if needed), propranolol, flomax, maxalt.     Do not wear jewelry, make-up or nail polish.  Do not wear lotions, powders, or perfumes, or deodorant.  Do not shave 48 hours prior to surgery.  Men may shave face and neck.  Do not bring valuables to the hospital.  [facility] is not responsible for any belongings or valuables.  Contacts, dentures or bridgework may not be worn into surgery.  Leave your suitcase in the car.  After surgery it may be brought to your room.  For patients admitted to the hospital, discharge time will be determined by your treatment team.  Patients discharged the day of surgery will not be allowed to drive home and must have someone with them for 24 hours.    Special instructions:   DO NOT smoke tobacco or vape for 24 hours before your procedure.  Please read over the following fact sheets that you were given. Coughing and Deep Breathing, Surgical Site Infection Prevention, Anesthesia Post-op Instructions, and Care and Recovery After Surgery      Ureteral Stent Implantation, Care After The following information offers guidance on how to care for yourself after your procedure. Your health care provider may also give you more specific instructions. If you have problems or questions, contact your health care provider. What can I expect after the procedure? After the  procedure, it is common to have: Nausea. Mild pain when you urinate. You may feel this pain in your lower back or lower abdomen. The pain should stop within a few minutes after you urinate. This pattern may last for up to 1 week. A small amount of blood in your urine for several days. Follow these instructions at home: Medicines Take over-the-counter and prescription medicines only as told by your health care provider. If you were prescribed antibiotics, take them as told by your health care provider. Do not stop using the antibiotic even if you start to feel better. If you were given a sedative during the procedure, it can affect you for several hours. Do not drive or operate machinery until your health care provider says that it is safe. Ask your health care provider if the medicine prescribed to you: Requires you to avoid driving or using machinery. Can cause constipation. You may need to take these actions to prevent or treat constipation: Take over-the-counter or prescription medicines. Eat foods that are high in fiber, such as beans, whole grains, and fresh fruits and vegetables. Limit foods that are high in fat and processed sugars, such as fried or sweet foods. Activity Rest as told by your health care provider. Do not sit for a long time without moving. Get up to take short walks every 1-2 hours. This will improve blood flow and breathing. Ask for help if you  feel weak or unsteady. Return to your normal activities as told by your health care provider. Ask your health care provider what activities are safe for you. General instructions  If you have a catheter: Follow instructions from your health care provider about taking care of your catheter and collection bag. Do not take baths, swim, or use a hot tub until your health care provider approves. Ask your health care provider if you may take showers. You may only be allowed to take sponge baths. Drink enough fluid to keep your urine  pale yellow. Do not use any products that contain nicotine or tobacco. These products include cigarettes, chewing tobacco, and vaping devices, such as e-cigarettes. These can delay healing after surgery. If you need help quitting, ask your health care provider. Keep all follow-up visits. Contact a health care provider if: You start passing blood clots, or you have more than a small amount of blood in your urine. You have pain that gets worse or does not get better with medicine, especially pain when you urinate. You have trouble urinating. You feel nauseous or you vomit again and again during a period of more than 2 days after the procedure. You have a fever. Get help right away if: You are passing blood clots that are 1 inch (2.5 cm) or larger in size. You are leaking urine (have incontinence), or you cannot urinate. The end of the stent comes out of your urethra. You have sudden, sharp, or severe pain in your abdomen or lower back. You have swelling or pain in your legs. You have trouble breathing. These symptoms may be an emergency. Get help right away. Call 911. Do not wait to see if the symptoms will go away. Do not drive yourself to the hospital. Summary After the procedure, it is common to have mild pain when you urinate that goes away within a few minutes after you urinate. This may last for up to 1 week. Take over-the-counter and prescription medicines only as told by your health care provider. Drink enough fluid to keep your urine pale yellow. Call your health care provider if you start passing blood clots, or you have more than a small amount of blood in your urine. This information is not intended to replace advice given to you by your health care provider. Make sure you discuss any questions you have with your health care provider. Document Revised: 05/05/2021 Document Reviewed: 05/05/2021 Elsevier Patient Education  [place] Anesthesia, Adult, Care  After The following information offers guidance on how to care for yourself after your procedure. Your health care provider may also give you more specific instructions. If you have problems or questions, contact your health care provider. What can I expect after the procedure? After the procedure, it is common for people to: Have pain or discomfort at the IV site. Have nausea or vomiting. Have a sore throat or hoarseness. Have trouble concentrating. Feel cold or chills. Feel weak, sleepy, or tired (fatigue). Have soreness and body aches. These can affect parts of the body that were not involved in surgery. Follow these instructions at home: For the time period you were told by your health care provider:  Rest. Do not participate in activities where you could fall or become injured. Do not drive or use machinery. Do not drink alcohol. Do not take sleeping pills or medicines that cause drowsiness. Do not make important decisions or sign legal documents. Do not take care of children on your own. General  instructions Drink enough fluid to keep your urine pale yellow. If you have sleep apnea, surgery and certain medicines can increase your risk for breathing problems. Follow instructions from your health care provider about wearing your sleep device: Anytime you are sleeping, including during daytime naps. While taking prescription pain medicines, sleeping medicines, or medicines that make you drowsy. Return to your normal activities as told by your health care provider. Ask your health care provider what activities are safe for you. Take over-the-counter and prescription medicines only as told by your health care provider. Do not use any products that contain nicotine or tobacco. These products include cigarettes, chewing tobacco, and vaping devices, such as e-cigarettes. These can delay incision healing after surgery. If you need help quitting, ask your health care provider. Contact a  health care provider if: You have nausea or vomiting that does not get better with medicine. You vomit every time you eat or drink. You have pain that does not get better with medicine. You cannot urinate or have bloody urine. You develop a skin rash. You have a fever. Get help right away if: You have trouble breathing. You have chest pain. You vomit blood. These symptoms may be an emergency. Get help right away. Call 911. Do not wait to see if the symptoms will go away. Do not drive yourself to the hospital. Summary After the procedure, it is common to have a sore throat, hoarseness, nausea, vomiting, or to feel weak, sleepy, or fatigue. For the time period you were told by your health care provider, do not drive or use machinery. Get help right away if you have difficulty breathing, have chest pain, or vomit blood. These symptoms may be an emergency. This information is not intended to replace advice given to you by your health care provider. Make sure you discuss any questions you have with your health care provider. Document Revised: 06/27/2021 Document Reviewed: 06/27/2021 Elsevier Patient Education  [place].

## 2022-03-12 ENCOUNTER — Ambulatory Visit (HOSPITAL_COMMUNITY): Payer: 59

## 2022-03-12 ENCOUNTER — Encounter (HOSPITAL_COMMUNITY)
Admission: RE | Disposition: A | Discharge status: Home / Self Care | Payer: Self-pay | Source: Home / Self Care | Attending: Urology

## 2022-03-12 ENCOUNTER — Ambulatory Visit (HOSPITAL_COMMUNITY): Payer: 59 | Admitting: Anesthesiology

## 2022-03-12 ENCOUNTER — Ambulatory Visit (HOSPITAL_BASED_OUTPATIENT_CLINIC_OR_DEPARTMENT_OTHER): Payer: 59 | Admitting: Anesthesiology

## 2022-03-12 ENCOUNTER — Telehealth: Payer: Self-pay

## 2022-03-12 ENCOUNTER — Ambulatory Visit (HOSPITAL_COMMUNITY)
Admission: RE | Admit: 2022-03-12 | Discharge: 2022-03-12 | Disposition: A | Discharge status: Home / Self Care | Payer: 59 | Attending: Urology | Admitting: Urology

## 2022-03-12 ENCOUNTER — Encounter (HOSPITAL_COMMUNITY): Payer: Self-pay | Admitting: Urology

## 2022-03-12 DIAGNOSIS — F32A Depression, unspecified: Secondary | ICD-10-CM | POA: Insufficient documentation

## 2022-03-12 DIAGNOSIS — F17210 Nicotine dependence, cigarettes, uncomplicated: Secondary | ICD-10-CM | POA: Insufficient documentation

## 2022-03-12 DIAGNOSIS — F419 Anxiety disorder, unspecified: Secondary | ICD-10-CM | POA: Insufficient documentation

## 2022-03-12 DIAGNOSIS — N289 Disorder of kidney and ureter, unspecified: Secondary | ICD-10-CM | POA: Diagnosis not present

## 2022-03-12 DIAGNOSIS — N200 Calculus of kidney: Secondary | ICD-10-CM

## 2022-03-12 DIAGNOSIS — G4730 Sleep apnea, unspecified: Secondary | ICD-10-CM | POA: Diagnosis not present

## 2022-03-12 DIAGNOSIS — Z87442 Personal history of urinary calculi: Secondary | ICD-10-CM | POA: Diagnosis not present

## 2022-03-12 HISTORY — PX: CYSTOSCOPY WITH RETROGRADE PYELOGRAM, URETEROSCOPY AND STENT PLACEMENT: SHX5789

## 2022-03-12 SURGERY — CYSTOURETEROSCOPY, WITH RETROGRADE PYELOGRAM AND STENT INSERTION
Anesthesia: General | Site: Bladder | Laterality: Left

## 2022-03-12 MED ORDER — DEXMEDETOMIDINE HCL IN NACL 200 MCG/50ML IV SOLN
INTRAVENOUS | Status: DC | PRN
  Administered 2022-03-12: 8 ug via INTRAVENOUS

## 2022-03-12 MED ORDER — PHENYLEPHRINE 80 MCG/ML (10ML) SYRINGE FOR IV PUSH (FOR BLOOD PRESSURE SUPPORT)
PREFILLED_SYRINGE | INTRAVENOUS | Status: DC | PRN
  Administered 2022-03-12: 80 ug via INTRAVENOUS

## 2022-03-12 MED ORDER — FENTANYL CITRATE (PF) 100 MCG/2ML IJ SOLN
INTRAMUSCULAR | Status: AC
  Filled 2022-03-12: qty 2

## 2022-03-12 MED ORDER — DIATRIZOATE MEGLUMINE 30 % UR SOLN
URETHRAL | Status: AC
  Filled 2022-03-12: qty 100

## 2022-03-12 MED ORDER — LACTATED RINGERS IV SOLN: INTRAVENOUS | Status: DC | PRN

## 2022-03-12 MED ORDER — DEXAMETHASONE SODIUM PHOSPHATE 10 MG/ML IJ SOLN
INTRAMUSCULAR | Status: DC | PRN
  Administered 2022-03-12: 10 mg via INTRAVENOUS

## 2022-03-12 MED ORDER — CEFAZOLIN SODIUM-DEXTROSE 2-4 GM/100ML-% IV SOLN
2.0000 g | INTRAVENOUS | Status: AC
  Administered 2022-03-12: 2 g via INTRAVENOUS

## 2022-03-12 MED ORDER — LIDOCAINE 2% (20 MG/ML) 5 ML SYRINGE
INTRAMUSCULAR | Status: DC | PRN
  Administered 2022-03-12: 100 mg via INTRAVENOUS

## 2022-03-12 MED ORDER — MIDAZOLAM HCL 5 MG/5ML IJ SOLN
INTRAMUSCULAR | Status: DC | PRN
  Administered 2022-03-12: 2 mg via INTRAVENOUS

## 2022-03-12 MED ORDER — TAMSULOSIN HCL 0.4 MG PO CAPS: 0.4000 mg | ORAL_CAPSULE | Freq: Every day | ORAL | 0 refills | Status: DC

## 2022-03-12 MED ORDER — MIDAZOLAM HCL 2 MG/2ML IJ SOLN
INTRAMUSCULAR | Status: AC
  Filled 2022-03-12: qty 2

## 2022-03-12 MED ORDER — SODIUM CHLORIDE 0.9 % IR SOLN
Status: DC | PRN
  Administered 2022-03-12: 3000 mL via INTRAVESICAL

## 2022-03-12 MED ORDER — ONDANSETRON HCL 4 MG/2ML IJ SOLN
INTRAMUSCULAR | Status: AC
  Filled 2022-03-12: qty 2

## 2022-03-12 MED ORDER — GLYCOPYRROLATE PF 0.2 MG/ML IJ SOSY
PREFILLED_SYRINGE | INTRAMUSCULAR | Status: DC | PRN
  Administered 2022-03-12: .2 mg via INTRAVENOUS

## 2022-03-12 MED ORDER — WATER FOR IRRIGATION, STERILE IR SOLN
Status: DC | PRN
  Administered 2022-03-12: 500 mL

## 2022-03-12 MED ORDER — PHENYLEPHRINE 80 MCG/ML (10ML) SYRINGE FOR IV PUSH (FOR BLOOD PRESSURE SUPPORT)
PREFILLED_SYRINGE | INTRAVENOUS | Status: AC
  Filled 2022-03-12: qty 10

## 2022-03-12 MED ORDER — PROPOFOL 10 MG/ML IV BOLUS
INTRAVENOUS | Status: AC
  Filled 2022-03-12: qty 20

## 2022-03-12 MED ORDER — OXYCODONE HCL 5 MG PO TABS: 5.0000 mg | ORAL_TABLET | ORAL | 0 refills | Status: AC | PRN

## 2022-03-12 MED ORDER — LIDOCAINE HCL (PF) 2 % IJ SOLN
INTRAMUSCULAR | Status: AC
  Filled 2022-03-12: qty 5

## 2022-03-12 MED ORDER — PROPOFOL 10 MG/ML IV BOLUS
INTRAVENOUS | Status: DC | PRN
  Administered 2022-03-12: 200 mg via INTRAVENOUS

## 2022-03-12 MED ORDER — ONDANSETRON HCL 4 MG/2ML IJ SOLN
INTRAMUSCULAR | Status: DC | PRN
  Administered 2022-03-12: 4 mg via INTRAVENOUS

## 2022-03-12 MED ORDER — KETOROLAC TROMETHAMINE 30 MG/ML IJ SOLN
INTRAMUSCULAR | Status: AC
  Filled 2022-03-12: qty 1

## 2022-03-12 MED ORDER — ONDANSETRON HCL 4 MG PO TABS: 4.0000 mg | ORAL_TABLET | Freq: Four times a day (QID) | ORAL | 0 refills | Status: AC | PRN

## 2022-03-12 MED ORDER — CEFAZOLIN SODIUM-DEXTROSE 2-4 GM/100ML-% IV SOLN
INTRAVENOUS | Status: AC
  Filled 2022-03-12: qty 100

## 2022-03-12 MED ORDER — FENTANYL CITRATE (PF) 100 MCG/2ML IJ SOLN
INTRAMUSCULAR | Status: DC | PRN
  Administered 2022-03-12: 50 ug via INTRAVENOUS

## 2022-03-12 MED ORDER — DIATRIZOATE MEGLUMINE 30 % UR SOLN
URETHRAL | Status: DC | PRN
  Administered 2022-03-12: 7 mL via URETHRAL

## 2022-03-12 MED ORDER — EPHEDRINE SULFATE-NACL 50-0.9 MG/10ML-% IV SOSY
PREFILLED_SYRINGE | INTRAVENOUS | Status: DC | PRN
  Administered 2022-03-12 (×2): 10 mg via INTRAVENOUS
  Administered 2022-03-12: 5 mg via INTRAVENOUS

## 2022-03-12 SURGICAL SUPPLY — 28 items
BAG DRAIN URO TABLE W/ADPT NS (BAG) ×2 IMPLANT
BAG DRN 8 ADPR NS SKTRN CSTL (BAG) ×2
BAG HAMPER (MISCELLANEOUS) ×2 IMPLANT
CATH INTERMIT  6FR 70CM (CATHETERS) ×2 IMPLANT
CATH URETL OPEN END 6FR 70 (CATHETERS) ×1 IMPLANT
CLOTH BEACON ORANGE TIMEOUT ST (SAFETY) ×2 IMPLANT
DECANTER SPIKE VIAL GLASS SM (MISCELLANEOUS) ×2 IMPLANT
EXTRACTOR STONE NITINOL NGAGE (UROLOGICAL SUPPLIES) ×1 IMPLANT
GLOVE BIO SURGEON STRL SZ8 (GLOVE) ×2 IMPLANT
GLOVE BIOGEL PI IND STRL 7.0 (GLOVE) ×4 IMPLANT
GOWN STRL REUS W/TWL LRG LVL3 (GOWN DISPOSABLE) ×2 IMPLANT
GOWN STRL REUS W/TWL XL LVL3 (GOWN DISPOSABLE) ×2 IMPLANT
GUIDEWIRE STR DUAL SENSOR (WIRE) ×2 IMPLANT
GUIDEWIRE STR ZIPWIRE 035X150 (MISCELLANEOUS) ×2 IMPLANT
IV NS IRRIG 3000ML ARTHROMATIC (IV SOLUTION) ×4 IMPLANT
KIT TURNOVER CYSTO (KITS) ×2 IMPLANT
MANIFOLD NEPTUNE II (INSTRUMENTS) ×2 IMPLANT
PACK CYSTO (CUSTOM PROCEDURE TRAY) ×2 IMPLANT
PAD ARMBOARD 7.5X6 YLW CONV (MISCELLANEOUS) ×2 IMPLANT
SHEATH NAVIGATOR HD 11/13X28 (SHEATH) ×1 IMPLANT
SHEATH URETERAL 12FRX35CM (MISCELLANEOUS) IMPLANT
STENT URET 6FRX26 CONTOUR (STENTS) ×1 IMPLANT
SYR 10ML LL (SYRINGE) ×2 IMPLANT
SYR CONTROL 10ML LL (SYRINGE) ×2 IMPLANT
TOWEL OR 17X26 4PK STRL BLUE (TOWEL DISPOSABLE) ×2 IMPLANT
TRACTIP FLEXIVA PULS ID 200XHI (Laser) IMPLANT
TRACTIP FLEXIVA PULSE ID 200 (Laser)
WATER STERILE IRR 500ML POUR (IV SOLUTION) ×2 IMPLANT

## 2022-03-12 NOTE — Anesthesia Preprocedure Evaluation (Addendum)
Anesthesia Evaluation  Patient identified by MRN, date of birth, ID band Patient awake    Reviewed: Allergy & Precautions, H&P , NPO status , Patient's Chart, lab work & pertinent test results  Airway Mallampati: II  TM Distance: >3 FB Neck ROM: Full    Dental  (+) Dental Advisory Given, Partial Upper   Pulmonary sleep apnea , Current Smoker and Patient abstained from smoking.   Pulmonary exam normal breath sounds clear to auscultation       Cardiovascular negative cardio ROS Normal cardiovascular exam Rhythm:Regular Rate:Normal     Neuro/Psych Seizures -,  PSYCHIATRIC DISORDERS Anxiety Depression       GI/Hepatic negative GI ROS, Neg liver ROS,,,  Endo/Other  negative endocrine ROS    Renal/GU Renal disease  negative genitourinary   Musculoskeletal  (+) Arthritis , Osteoarthritis,    Abdominal   Peds negative pediatric ROS (+)  Hematology negative hematology ROS (+)   Anesthesia Other Findings   Reproductive/Obstetrics negative OB ROS                             Anesthesia Physical Anesthesia Plan  ASA: 2  Anesthesia Plan: General   Post-op Pain Management: Dilaudid IV   Induction: Intravenous  PONV Risk Score and Plan: 3 and Ondansetron, Dexamethasone and Midazolam  Airway Management Planned: LMA and Oral ETT  Additional Equipment:   Intra-op Plan:   Post-operative Plan: Extubation in OR  Informed Consent: I have reviewed the patients History and Physical, chart, labs and discussed the procedure including the risks, benefits and alternatives for the proposed anesthesia with the patient or authorized representative who has indicated his/her understanding and acceptance.     Dental advisory given  Plan Discussed with: CRNA and Surgeon  Anesthesia Plan Comments:        Anesthesia Quick Evaluation

## 2022-03-12 NOTE — Telephone Encounter (Signed)
Patient called in today voicing that his medication oxycodone (Roxicodone) 5 mg release tablet need prior authorization through his insurance. Made patient aware that I will send a task to Dr. Ronne Binning regarding medication and our office will start the Prior authorization. Patient voiced understanding

## 2022-03-12 NOTE — Interval H&P Note (Signed)
History and Physical Interval Note:  03/12/2022 8:39 AM  Gregory Flores  has presented today for surgery, with the diagnosis of left renal calculus.  The various methods of treatment have been discussed with the patient and family. After consideration of risks, benefits and other options for treatment, the patient has consented to  Procedure(s) with comments: CYSTOSCOPY WITH RETROGRADE PYELOGRAM, URETEROSCOPY AND STENT PLACEMENT (Left) - LM for pt to arrive at 6:30 HOLMIUM LASER APPLICATION (Left) as a surgical intervention.  The patient's history has been reviewed, patient examined, no change in status, stable for surgery.  I have reviewed the patient's chart and labs.  Questions were answered to the patient's satisfaction.     Wilkie Aye

## 2022-03-12 NOTE — Telephone Encounter (Signed)
Patient called advising that the Pharmacy contacted him stating they were having an issue filling medication below. They advised they needed you to contact the pharmacy to verify some information.   Medication: oxyCODONE (ROXICODONE) 5 MG immediate release tablet    Pharmacy: [id number] - [place], [place] - [address] AT [address]    Thank you

## 2022-03-12 NOTE — Progress Notes (Signed)
Called pt regarding procedure. Pt was called yesterday and left voicemail regarding new arrival time. Pt stated he will be here at 0800.

## 2022-03-12 NOTE — Transfer of Care (Signed)
Immediate Anesthesia Transfer of Care Note  Patient: Gregory Flores  Procedure(s) Performed: CYSTOSCOPY WITH RETROGRADE PYELOGRAM, URETEROSCOPY AND STENT PLACEMENT (Left: Bladder)  Patient Location: PACU  Anesthesia Type:General  Level of Consciousness: awake, alert , oriented, and patient cooperative  Airway & Oxygen Therapy: Patient Spontanous Breathing and Patient connected to nasal cannula oxygen  Post-op Assessment: Report given to RN, Post -op Vital signs reviewed and stable, and Patient moving all extremities  Post vital signs: Reviewed and stable  Last Vitals:  Vitals Value Taken Time  BP 125/89 03/12/22 1025  Temp 36.6 C 03/12/22 1025  Pulse 93 03/12/22 1025  Resp 18 03/12/22 1025  SpO2 98 % 03/12/22 1025    Last Pain:  Vitals:   03/12/22 1025  TempSrc: Oral  PainSc: 7       Patients Stated Pain Goal: 7 (03/12/22 8472)  Complications: No notable events documented.

## 2022-03-12 NOTE — Op Note (Signed)
.  Preoperative diagnosis: Left renal stones  Postoperative diagnosis: Same  Procedure: 1 cystoscopy 2. Left retrograde pyelography 3.  Intraoperative fluoroscopy, under one hour, with interpretation 4.  Left ureteroscopic stone manipulation with basket extraction 5.  Left 6 x 26 JJ stent placement  Attending: Cleda Mccreedy  Anesthesia: General  Estimated blood loss: None  Drains: Left 6 x 26 JJ ureteral stent with tether  Specimens: stone for analysis  Antibiotics: ancef  Findings: left lower pole stones. No hydronephrosis. No masses/lesions in the bladder. Ureteral orifices in normal anatomic location.  Indications: Patient is a 40 year old male with a history of left renal stone and who has persistent left flank pain.  After discussing treatment options, he decided proceed with left ureteroscopic stone manipulation.  Procedure in detail: The patient was brought to the operating room and a brief timeout was done to ensure correct patient, correct procedure, correct site.  General anesthesia was administered patient was placed in dorsal lithotomy position.  His genitalia was then prepped and draped in usual sterile fashion.  A rigid 22 French cystoscope was passed in the urethra and the bladder.  Bladder was inspected free masses or lesions.  the ureteral orifices were in the normal orthotopic locations.  a 6 french ureteral catheter was then instilled into the left ureteral orifice.  a gentle retrograde was obtained and findings noted above.  we then placed a zip wire through the ureteral catheter and advanced up to the renal pelvis.  we then removed the cystoscope and cannulated the left ureteral orifice with a semirigid ureteroscope.  No stone was found in the ureter. Once we reached the UPJ a sensor wire was advanced in to the renal pelvis. We then removed the ureteroscope and advanced am 11/13 x 36cm access sheath up to the renal pelvis. We then used the flexible ureteroscope to  perform nephroscopy. We encountered the stones in the lower pole.    the stones were then removed with a Ngage basket.    once all stones were removed we then removed the access sheath under direct vision and noted no injury to the ureter. We then placed a 6 x 26 double-j ureteral stent over the original zip wire.  We then removed the wire and good coil was noted in the the renal pelvis under fluoroscopy and the bladder under direct vision. the bladder was then drained and this concluded the procedure which was well tolerated by patient.  Complications: None  Condition: Stable, extubated, transferred to PACU  Plan: Patient is to be discharged home as to follow-up in one week. He is to remove his stent in 72 hours by pulling the tether

## 2022-03-12 NOTE — Anesthesia Postprocedure Evaluation (Signed)
Anesthesia Post Note  Patient: Gregory Flores  Procedure(s) Performed: CYSTOSCOPY WITH RETROGRADE PYELOGRAM, URETEROSCOPY AND STENT PLACEMENT (Left: Bladder)  Patient location during evaluation: Phase II Anesthesia Type: General Level of consciousness: awake and alert and oriented Pain management: pain level controlled Vital Signs Assessment: post-procedure vital signs reviewed and stable Respiratory status: spontaneous breathing, nonlabored ventilation and respiratory function stable Cardiovascular status: blood pressure returned to baseline and stable Postop Assessment: no apparent nausea or vomiting Anesthetic complications: no  No notable events documented.   Last Vitals:  Vitals:   03/12/22 1015 03/12/22 1025  BP: 117/82 125/89  Pulse: 87 93  Resp:  18  Temp:  36.6 C  SpO2: 100% 98%    Last Pain:  Vitals:   03/12/22 1025  TempSrc: Oral  PainSc: 7                  Shareese Macha C Klayton Monie

## 2022-03-12 NOTE — Anesthesia Procedure Notes (Signed)
Procedure Name: LMA Insertion Date/Time: 03/12/2022 9:15 AM  Performed by: Lucinda Dell, CRNAPre-anesthesia Checklist: Patient identified, Emergency Drugs available, Suction available and Patient being monitored Patient Re-evaluated:Patient Re-evaluated prior to induction Oxygen Delivery Method: Circle system utilized Preoxygenation: Pre-oxygenation with 100% oxygen Induction Type: IV induction Ventilation: Mask ventilation without difficulty LMA: LMA inserted LMA Size: 4.0 Tube type: Oral Number of attempts: 1 Placement Confirmation: positive ETCO2 and breath sounds checked- equal and bilateral Tube secured with: Tape Dental Injury: Teeth and Oropharynx as per pre-operative assessment

## 2022-03-12 NOTE — Progress Notes (Signed)
Called registration. Pt has not arrived.

## 2022-03-13 ENCOUNTER — Encounter: Payer: Self-pay | Admitting: Urology

## 2022-03-16 ENCOUNTER — Encounter (HOSPITAL_COMMUNITY): Payer: Self-pay | Admitting: Urology

## 2022-03-18 ENCOUNTER — Encounter: Payer: Self-pay | Admitting: Urology

## 2022-03-18 ENCOUNTER — Ambulatory Visit: Payer: 59 | Admitting: Urology

## 2022-03-18 VITALS — BP 132/81 | HR 80

## 2022-03-18 DIAGNOSIS — N200 Calculus of kidney: Secondary | ICD-10-CM

## 2022-03-18 LAB — URINALYSIS, ROUTINE W REFLEX MICROSCOPIC
Bilirubin, UA: NEGATIVE
Glucose, UA: NEGATIVE
Ketones, UA: NEGATIVE
Nitrite, UA: NEGATIVE
Specific Gravity, UA: 1.03 (ref 1.005–1.030)
Urobilinogen, Ur: 0.2 mg/dL (ref 0.2–1.0)
pH, UA: 5.5 (ref 5.0–7.5)

## 2022-03-18 LAB — MICROSCOPIC EXAMINATION
Bacteria, UA: NONE SEEN
RBC, Urine: >30 /hpf — AB (ref 0–2)

## 2022-03-18 NOTE — Patient Instructions (Signed)

## 2022-03-21 LAB — CALCULI, WITH PHOTOGRAPH (CLINICAL LAB)
Calcium Oxalate Dihydrate: 40 %
Calcium Oxalate Monohydrate: 60 %
Weight Calculi: 21 mg

## 2022-03-24 NOTE — Progress Notes (Signed)
03/18/2022 11:02 AM   Gregory Flores February 25, 1981 [id number]  Referring provider: Laurann Montana, MD [phone] [address] [place],  [place] [address]  Followup nephrolithiasis   HPI: Gregory Flores is a 41yo here for followup for nephrolithiasis. He underwent ureteroscopic stone extraction last week. His tethered stent is in place currently. He has had recurrent stones since ago 20. He has mild flank pain with the stent in place, NO other complaints   PMH: Past Medical History:  Diagnosis Date   Anxiety    Arthritis    Bulging lumbar disc    Chronic lumbar pain    Depression    Drug-seeking behavior    History of kidney stones    Kidney stone    Kidney stones    Night terrors, adult    Renal disorder    Sleep apnea     Surgical History: Past Surgical History:  Procedure Laterality Date   BLADDER STONE REMOVAL     CYSTOSCOPY WITH RETROGRADE PYELOGRAM, URETEROSCOPY AND STENT PLACEMENT Left 12/11/2021   Procedure: CYSTOSCOPY WITH RETROGRADE PYELOGRAM, URETEROSCOPY AND STENT PLACEMENT;  Surgeon: Malen Gauze, MD;  Location: [facility];  Service: Urology;  Laterality: Left;   CYSTOSCOPY WITH RETROGRADE PYELOGRAM, URETEROSCOPY AND STENT PLACEMENT Left 03/12/2022   Procedure: CYSTOSCOPY WITH RETROGRADE PYELOGRAM, URETEROSCOPY AND STENT PLACEMENT;  Surgeon: Malen Gauze, MD;  Location: [facility];  Service: Urology;  Laterality: Left;  LM for pt to arrive at 6:30   HOLMIUM LASER APPLICATION Left 12/11/2021   Procedure: HOLMIUM LASER APPLICATION;  Surgeon: Malen Gauze, MD;  Location: [facility];  Service: Urology;  Laterality: Left;   LITHOTRIPSY      Home Medications:  Allergies as of 03/18/2022   No Known Allergies      Medication List        Accurate as of March 18, 2022 11:59 PM. If you have any questions, ask your nurse or doctor.          acetaminophen 325 MG tablet Commonly known as: Tylenol Take 2 tablets (650 mg total) by mouth every 6 (six)  hours as needed.   buPROPion 300 MG 24 hr tablet Commonly known as: WELLBUTRIN XL Take 300 mg by mouth daily.   buPROPion 150 MG 24 hr tablet Commonly known as: WELLBUTRIN XL Take 150 mg by mouth daily.   docusate sodium 100 MG capsule Commonly known as: COLACE Take 100 mg by mouth daily as needed for mild constipation.   DULoxetine 30 MG capsule Commonly known as: CYMBALTA Take 30 mg by mouth 2 (two) times daily.   gabapentin 600 MG tablet Commonly known as: NEURONTIN Take 600 mg by mouth 3 (three) times daily.   ibuprofen 600 MG tablet Commonly known as: ADVIL Take 1 tablet (600 mg total) by mouth every 6 (six) hours as needed.   methylphenidate 20 MG tablet Commonly known as: RITALIN Take 20 mg by mouth 2 (two) times daily.   ondansetron 4 MG tablet Commonly known as: ZOFRAN Take 1 tablet (4 mg total) by mouth every 6 (six) hours as needed for nausea or vomiting.   oxyCODONE 5 MG immediate release tablet Commonly known as: Roxicodone Take 1 tablet (5 mg total) by mouth every 4 (four) hours as needed for severe pain.   oxyCODONE-acetaminophen 5-325 MG tablet Commonly known as: PERCOCET/ROXICET Take 1 tablet by mouth every 4 (four) hours as needed for severe pain.   propranolol 80 MG tablet Commonly known as: INDERAL Take 80  mg by mouth daily.   rizatriptan 10 MG tablet Commonly known as: MAXALT Take 10 mg by mouth as needed for migraine.   tamsulosin 0.4 MG Caps capsule Commonly known as: FLOMAX Take 1 capsule (0.4 mg total) by mouth daily after breakfast.        Allergies: No Known Allergies  Family History: History reviewed. No pertinent family history.  Social History:  reports that he has been smoking cigarettes. He has a 1.75 pack-year smoking history. He has never used smokeless tobacco. He reports that he does not drink alcohol and does not use drugs.  ROS: All other review of systems were reviewed and are negative except what is noted above  in HPI  Physical Exam: BP 132/81   Pulse 80   Constitutional:  Alert and oriented, No acute distress. HEENT: [place] AT, moist mucus membranes.  Trachea midline, no masses. Cardiovascular: No clubbing, cyanosis, or edema. Respiratory: Normal respiratory effort, no increased work of breathing. GI: Abdomen is soft, nontender, nondistended, no abdominal masses GU: No CVA tenderness.  Lymph: No cervical or inguinal lymphadenopathy. Skin: No rashes, bruises or suspicious lesions. Neurologic: Grossly intact, no focal deficits, moving all 4 extremities. Psychiatric: Normal mood and affect.  Laboratory Data: Lab Results  Component Value Date   WBC 13.8 (H) 11/25/2021   HGB 15.6 11/25/2021   HCT 44.3 11/25/2021   MCV 88.4 11/25/2021   PLT 270 11/25/2021    Lab Results  Component Value Date   CREATININE 1.47 (H) 11/25/2021    No results found for: "PSA"  No results found for: "TESTOSTERONE"  No results found for: "HGBA1C"  Urinalysis    Component Value Date/Time   COLORURINE AMBER (A) 11/25/2021 0245   APPEARANCEUR Cloudy (A) 03/18/2022 1104   LABSPEC 1.023 11/25/2021 0245   PHURINE 5.0 11/25/2021 0245   GLUCOSEU Negative 03/18/2022 1104   HGBUR LARGE (A) 11/25/2021 0245   BILIRUBINUR Negative 03/18/2022 1104   KETONESUR NEGATIVE 11/25/2021 0245   PROTEINUR 3+ (A) 03/18/2022 1104   PROTEINUR 30 (A) 11/25/2021 0245   UROBILINOGEN 1.0 01/28/2015 1407   NITRITE Negative 03/18/2022 1104   NITRITE NEGATIVE 11/25/2021 0245   LEUKOCYTESUR Trace (A) 03/18/2022 1104   LEUKOCYTESUR TRACE (A) 11/25/2021 0245    Lab Results  Component Value Date   LABMICR See below: 03/18/2022   WBCUA 0-5 03/18/2022   LABEPIT 0-10 03/18/2022   MUCUS Present 12/17/2021   BACTERIA None seen 03/18/2022    Pertinent Imaging:  Results for orders placed in visit on 01/31/22  DG Abd 1 View  Narrative CLINICAL DATA:  Kidney stones  EXAM: ABDOMEN - 1 VIEW  COMPARISON:   12/03/2021  FINDINGS: 4 mm calculus projects over RIGHT kidney.  No additional urinary tract calcifications.  Increased stool throughout colon.  Nonobstructive bowel gas pattern.  Osseous structures unremarkable.  IMPRESSION: 4 mm RIGHT renal calculus.   Electronically Signed By: Ulyses Southward M.D. On: 02/11/2022 14:11  No results found for this or any previous visit.  No results found for this or any previous visit.  No results found for this or any previous visit.  Results for orders placed during the hospital encounter of 01/21/22  Ultrasound renal complete  Narrative CLINICAL DATA:  Nephrolithiasis follow-up.  EXAM: RENAL / URINARY TRACT ULTRASOUND COMPLETE  COMPARISON:  CT renal stone November 25, 2021  FINDINGS: Right Kidney:  Renal measurements: 8.3 x 5.4 x 5.9 cm = volume: 139.1 mL. Echogenicity within normal limits. No mass or hydronephrosis visualized.  Left Kidney:  Renal measurements: 9.9 x 6.4 x 5.5 cm = volume: 181.9 mL. Echogenicity within normal limits. No mass or hydronephrosis visualized. Note is made of a 7 mm stone within the inferior pole.  Bladder:  Appears normal for degree of bladder distention.  Other:  None.  IMPRESSION: 1. No hydronephrosis. 2. 7 mm stone within the inferior pole of the left kidney.   Electronically Signed By: Annia Belt M.D. On: 01/22/2022 06:08  No valid procedures specified. No results found for this or any previous visit.  Results for orders placed during the hospital encounter of 11/25/21  CT RENAL STONE STUDY  Narrative CLINICAL DATA:  Left flank pain.  EXAM: CT ABDOMEN AND PELVIS WITHOUT CONTRAST  TECHNIQUE: Multidetector CT imaging of the abdomen and pelvis was performed following the standard protocol without IV contrast.  RADIATION DOSE REDUCTION: This exam was performed according to the departmental dose-optimization program which includes automated exposure control, adjustment  of the mA and/or kV according to patient size and/or use of iterative reconstruction technique.  COMPARISON:  May 16, 2018  FINDINGS: Lower chest: No acute abnormality.  Hepatobiliary: No focal liver abnormality is seen. No gallstones, gallbladder wall thickening, or biliary dilatation.  Pancreas: Unremarkable. No pancreatic ductal dilatation or surrounding inflammatory changes.  Spleen: Normal in size without focal abnormality.  Adrenals/Urinary Tract: Adrenal glands are unremarkable. Kidneys are normal in size, without focal lesions. A 4 mm nonobstructing renal calculus is seen within the mid to lower right kidney. 2 mm and 3 mm nonobstructing renal calculi are also seen within the left kidney. A 7 mm obstructing renal calculus is seen within the proximal left ureter, with moderate severity left-sided hydronephrosis and hydroureter. The urinary bladder is poorly distended and subsequently limited in evaluation.  Stomach/Bowel: Stomach is within normal limits. Appendix appears normal. No evidence of bowel wall thickening, distention, or inflammatory changes.  Vascular/Lymphatic: No significant vascular findings are present. No enlarged abdominal or pelvic lymph nodes.  Reproductive: Prostate is unremarkable.  Other: No abdominal wall hernia or abnormality. No abdominopelvic ascites.  Musculoskeletal: No acute or significant osseous findings.  IMPRESSION: 1. 7 mm obstructing renal calculus within the proximal left ureter. 2. Bilateral nonobstructing renal calculi.   Electronically Signed By: Aram Candela M.D. On: 11/25/2021 03:11   Assessment & Plan:    1. Kidney stones -Tethered stent removed today -Patient has elected to pursue metabolic evaluation for recurrent stones - Urinalysis, Routine w reflex microscopic - Basic metabolic panel - Uric acid - PTH, Intact and Calcium - Ultrasound renal complete; Future   Return in about 4 weeks (around  04/15/2022) for renal US.  Wilkie Aye, MD  [facility] [place]

## 2022-03-31 ENCOUNTER — Other Ambulatory Visit: Payer: Self-pay | Admitting: Urology

## 2022-04-13 DEATH — deceased

## 2022-04-24 ENCOUNTER — Ambulatory Visit: Payer: 59 | Admitting: Urology
